# Patient Record
Sex: Male | Born: 1940 | Race: Black or African American | Hispanic: No | Marital: Married | Smoking: Never smoker
Health system: Southern US, Community
[De-identification: ages and names within clinical notes are randomized; demographics above are authoritative.]

## PROBLEM LIST (undated history)

## (undated) DIAGNOSIS — E663 Overweight: Secondary | ICD-10-CM

## (undated) DIAGNOSIS — C61 Malignant neoplasm of prostate: Secondary | ICD-10-CM

## (undated) DIAGNOSIS — C49A0 Gastrointestinal stromal tumor, unspecified site: Secondary | ICD-10-CM

## (undated) DIAGNOSIS — T7840XA Allergy, unspecified, initial encounter: Secondary | ICD-10-CM

## (undated) DIAGNOSIS — M1990 Unspecified osteoarthritis, unspecified site: Secondary | ICD-10-CM

## (undated) DIAGNOSIS — E785 Hyperlipidemia, unspecified: Secondary | ICD-10-CM

## (undated) DIAGNOSIS — N529 Male erectile dysfunction, unspecified: Secondary | ICD-10-CM

## (undated) DIAGNOSIS — N289 Disorder of kidney and ureter, unspecified: Secondary | ICD-10-CM

## (undated) DIAGNOSIS — E049 Nontoxic goiter, unspecified: Secondary | ICD-10-CM

## (undated) DIAGNOSIS — I10 Essential (primary) hypertension: Secondary | ICD-10-CM

## (undated) HISTORY — DX: Unspecified osteoarthritis, unspecified site: M19.90

## (undated) HISTORY — DX: Overweight: E66.3

## (undated) HISTORY — DX: Malignant neoplasm of prostate: C61

## (undated) HISTORY — DX: Hyperlipidemia, unspecified: E78.5

## (undated) HISTORY — DX: Male erectile dysfunction, unspecified: N52.9

## (undated) HISTORY — DX: Essential (primary) hypertension: I10

## (undated) HISTORY — DX: Disorder of kidney and ureter, unspecified: N28.9

## (undated) HISTORY — DX: Allergy, unspecified, initial encounter: T78.40XA

## (undated) HISTORY — PX: RADIOACTIVE SEED IMPLANT: SHX5150

---

## 1965-11-24 HISTORY — PX: TONSILLECTOMY: SHX5217

## 2000-11-02 ENCOUNTER — Encounter: Admission: RE | Admit: 2000-11-02 | Discharge: 2000-11-02 | Payer: Self-pay | Admitting: Internal Medicine

## 2001-03-30 ENCOUNTER — Encounter: Admission: RE | Admit: 2001-03-30 | Discharge: 2001-03-30 | Payer: Self-pay | Admitting: Internal Medicine

## 2001-04-08 ENCOUNTER — Encounter: Admission: RE | Admit: 2001-04-08 | Discharge: 2001-04-08 | Payer: Self-pay | Admitting: Hematology and Oncology

## 2001-08-12 ENCOUNTER — Encounter: Admission: RE | Admit: 2001-08-12 | Discharge: 2001-08-12 | Payer: Self-pay | Admitting: Internal Medicine

## 2001-12-23 ENCOUNTER — Encounter: Admission: RE | Admit: 2001-12-23 | Discharge: 2001-12-23 | Payer: Self-pay

## 2002-06-28 ENCOUNTER — Encounter: Admission: RE | Admit: 2002-06-28 | Discharge: 2002-06-28 | Payer: Self-pay | Admitting: Internal Medicine

## 2002-08-19 ENCOUNTER — Encounter: Admission: RE | Admit: 2002-08-19 | Discharge: 2002-08-19 | Payer: Self-pay | Admitting: Internal Medicine

## 2003-01-23 ENCOUNTER — Encounter: Admission: RE | Admit: 2003-01-23 | Discharge: 2003-01-23 | Payer: Self-pay | Admitting: Internal Medicine

## 2003-01-30 ENCOUNTER — Encounter: Admission: RE | Admit: 2003-01-30 | Discharge: 2003-01-30 | Payer: Self-pay | Admitting: Internal Medicine

## 2003-02-06 ENCOUNTER — Encounter: Admission: RE | Admit: 2003-02-06 | Discharge: 2003-02-06 | Payer: Self-pay | Admitting: Internal Medicine

## 2003-02-14 ENCOUNTER — Encounter: Admission: RE | Admit: 2003-02-14 | Discharge: 2003-02-14 | Payer: Self-pay | Admitting: Internal Medicine

## 2003-02-28 ENCOUNTER — Encounter: Admission: RE | Admit: 2003-02-28 | Discharge: 2003-02-28 | Payer: Self-pay | Admitting: Internal Medicine

## 2003-09-26 ENCOUNTER — Encounter: Admission: RE | Admit: 2003-09-26 | Discharge: 2003-09-26 | Payer: Self-pay | Admitting: Internal Medicine

## 2004-01-24 ENCOUNTER — Encounter: Admission: RE | Admit: 2004-01-24 | Discharge: 2004-01-24 | Payer: Self-pay | Admitting: Internal Medicine

## 2004-02-02 ENCOUNTER — Encounter: Admission: RE | Admit: 2004-02-02 | Discharge: 2004-02-02 | Payer: Self-pay | Admitting: Internal Medicine

## 2004-04-18 ENCOUNTER — Encounter: Admission: RE | Admit: 2004-04-18 | Discharge: 2004-04-18 | Payer: Self-pay | Admitting: Internal Medicine

## 2004-09-16 ENCOUNTER — Ambulatory Visit: Payer: Self-pay | Admitting: Internal Medicine

## 2004-09-20 ENCOUNTER — Ambulatory Visit: Payer: Self-pay | Admitting: Internal Medicine

## 2005-03-20 ENCOUNTER — Ambulatory Visit: Payer: Self-pay | Admitting: Internal Medicine

## 2005-03-31 ENCOUNTER — Ambulatory Visit: Payer: Self-pay | Admitting: Internal Medicine

## 2005-04-03 ENCOUNTER — Ambulatory Visit: Payer: Self-pay | Admitting: Internal Medicine

## 2005-04-17 ENCOUNTER — Ambulatory Visit: Payer: Self-pay | Admitting: Internal Medicine

## 2005-09-25 ENCOUNTER — Ambulatory Visit: Payer: Self-pay | Admitting: Hospitalist

## 2005-10-27 ENCOUNTER — Ambulatory Visit: Payer: Self-pay | Admitting: Internal Medicine

## 2005-11-03 ENCOUNTER — Ambulatory Visit: Payer: Self-pay | Admitting: Internal Medicine

## 2005-11-12 ENCOUNTER — Ambulatory Visit: Payer: Self-pay | Admitting: Internal Medicine

## 2006-05-28 ENCOUNTER — Ambulatory Visit: Payer: Self-pay | Admitting: Hospitalist

## 2006-06-02 ENCOUNTER — Ambulatory Visit: Payer: Self-pay | Admitting: Hospitalist

## 2006-06-25 ENCOUNTER — Ambulatory Visit (HOSPITAL_COMMUNITY): Admission: RE | Admit: 2006-06-25 | Discharge: 2006-06-25 | Payer: Self-pay | Admitting: Gastroenterology

## 2006-10-02 DIAGNOSIS — I10 Essential (primary) hypertension: Secondary | ICD-10-CM | POA: Insufficient documentation

## 2006-10-02 DIAGNOSIS — E785 Hyperlipidemia, unspecified: Secondary | ICD-10-CM

## 2006-10-02 DIAGNOSIS — M1990 Unspecified osteoarthritis, unspecified site: Secondary | ICD-10-CM | POA: Insufficient documentation

## 2006-10-02 DIAGNOSIS — J309 Allergic rhinitis, unspecified: Secondary | ICD-10-CM | POA: Insufficient documentation

## 2006-10-28 ENCOUNTER — Ambulatory Visit: Payer: Self-pay | Admitting: Internal Medicine

## 2006-10-28 LAB — CONVERTED CEMR LAB
CO2: 33 meq/L — ABNORMAL HIGH (ref 19–32)
GFR calc non Af Amer: 59 mL/min
Glomerular Filtration Rate, Af Am: 71 mL/min/{1.73_m2}
Glucose, Bld: 99 mg/dL (ref 70–99)
HCT: 38.9 % — ABNORMAL LOW (ref 39.0–52.0)
Hemoglobin: 13.2 g/dL (ref 13.0–17.0)
MCHC: 34 g/dL (ref 30.0–36.0)
MCV: 95.3 fL (ref 78.0–100.0)
Platelets: 427 10*3/uL — ABNORMAL HIGH (ref 150–400)
RBC: 4.08 M/uL — ABNORMAL LOW (ref 4.22–5.81)
Sodium: 140 meq/L (ref 135–145)

## 2006-12-13 DIAGNOSIS — N259 Disorder resulting from impaired renal tubular function, unspecified: Secondary | ICD-10-CM | POA: Insufficient documentation

## 2007-05-12 ENCOUNTER — Encounter: Admission: RE | Admit: 2007-05-12 | Discharge: 2007-05-12 | Payer: Self-pay | Admitting: Internal Medicine

## 2007-05-12 ENCOUNTER — Ambulatory Visit: Payer: Self-pay | Admitting: Internal Medicine

## 2007-05-12 DIAGNOSIS — R0989 Other specified symptoms and signs involving the circulatory and respiratory systems: Secondary | ICD-10-CM

## 2007-05-18 ENCOUNTER — Encounter (INDEPENDENT_AMBULATORY_CARE_PROVIDER_SITE_OTHER): Payer: Self-pay | Admitting: *Deleted

## 2007-05-24 ENCOUNTER — Ambulatory Visit: Payer: Self-pay | Admitting: Internal Medicine

## 2007-07-05 ENCOUNTER — Telehealth (INDEPENDENT_AMBULATORY_CARE_PROVIDER_SITE_OTHER): Payer: Self-pay | Admitting: *Deleted

## 2007-07-07 ENCOUNTER — Encounter (INDEPENDENT_AMBULATORY_CARE_PROVIDER_SITE_OTHER): Payer: Self-pay | Admitting: *Deleted

## 2007-07-07 ENCOUNTER — Encounter: Payer: Self-pay | Admitting: Internal Medicine

## 2007-08-20 ENCOUNTER — Ambulatory Visit: Payer: Self-pay | Admitting: Internal Medicine

## 2007-09-01 ENCOUNTER — Ambulatory Visit: Payer: Self-pay | Admitting: *Deleted

## 2007-09-10 ENCOUNTER — Ambulatory Visit: Payer: Self-pay | Admitting: *Deleted

## 2007-11-01 ENCOUNTER — Encounter
Admission: RE | Admit: 2007-11-01 | Discharge: 2007-11-02 | Payer: Self-pay | Admitting: Physical Medicine & Rehabilitation

## 2007-11-01 ENCOUNTER — Ambulatory Visit: Payer: Self-pay | Admitting: Physical Medicine & Rehabilitation

## 2007-11-02 ENCOUNTER — Encounter (INDEPENDENT_AMBULATORY_CARE_PROVIDER_SITE_OTHER): Payer: Self-pay | Admitting: *Deleted

## 2007-11-25 HISTORY — PX: HERNIA REPAIR: SHX51

## 2007-12-02 ENCOUNTER — Ambulatory Visit: Payer: Self-pay | Admitting: Internal Medicine

## 2007-12-02 DIAGNOSIS — F528 Other sexual dysfunction not due to a substance or known physiological condition: Secondary | ICD-10-CM | POA: Insufficient documentation

## 2008-02-21 ENCOUNTER — Ambulatory Visit: Payer: Self-pay | Admitting: Internal Medicine

## 2008-02-21 LAB — CONVERTED CEMR LAB
Nitrite: NEGATIVE
Urobilinogen, UA: NEGATIVE
WBC Urine, dipstick: NEGATIVE
pH: 6.5

## 2008-02-28 ENCOUNTER — Encounter (INDEPENDENT_AMBULATORY_CARE_PROVIDER_SITE_OTHER): Payer: Self-pay | Admitting: *Deleted

## 2008-05-16 ENCOUNTER — Encounter: Admission: RE | Admit: 2008-05-16 | Discharge: 2008-05-16 | Payer: Self-pay | Admitting: General Surgery

## 2008-05-17 ENCOUNTER — Ambulatory Visit (HOSPITAL_BASED_OUTPATIENT_CLINIC_OR_DEPARTMENT_OTHER): Admission: RE | Admit: 2008-05-17 | Discharge: 2008-05-17 | Payer: Self-pay | Admitting: General Surgery

## 2008-11-20 ENCOUNTER — Telehealth (INDEPENDENT_AMBULATORY_CARE_PROVIDER_SITE_OTHER): Payer: Self-pay | Admitting: *Deleted

## 2008-11-20 ENCOUNTER — Emergency Department (HOSPITAL_BASED_OUTPATIENT_CLINIC_OR_DEPARTMENT_OTHER): Admission: EM | Admit: 2008-11-20 | Discharge: 2008-11-20 | Payer: Self-pay | Admitting: Emergency Medicine

## 2009-03-20 ENCOUNTER — Ambulatory Visit: Payer: Self-pay | Admitting: Internal Medicine

## 2009-03-23 ENCOUNTER — Telehealth (INDEPENDENT_AMBULATORY_CARE_PROVIDER_SITE_OTHER): Payer: Self-pay | Admitting: *Deleted

## 2009-03-23 LAB — CONVERTED CEMR LAB
Basophils Relative: 1.8 % (ref 0.0–3.0)
CO2: 35 meq/L — ABNORMAL HIGH (ref 19–32)
Calcium: 9.1 mg/dL (ref 8.4–10.5)
Creatinine, Ser: 1.2 mg/dL (ref 0.4–1.5)
Eosinophils Absolute: 0.2 10*3/uL (ref 0.0–0.7)
Eosinophils Relative: 3.8 % (ref 0.0–5.0)
GFR calc non Af Amer: 77.29 mL/min (ref >60)
Glucose, Bld: 94 mg/dL (ref 70–99)
HCT: 38.5 % — ABNORMAL LOW (ref 39.0–52.0)
HDL: 49.4 mg/dL (ref >39.00)
Lymphs Abs: 1.5 10*3/uL (ref 0.7–4.0)
MCHC: 34.7 g/dL (ref 30.0–36.0)
MCV: 95 fL (ref 78.0–100.0)
Monocytes Absolute: 0.4 10*3/uL (ref 0.1–1.0)
Neutrophils Relative %: 48.2 % (ref 43.0–77.0)
RBC: 4.05 M/uL — ABNORMAL LOW (ref 4.22–5.81)
Sodium: 143 meq/L (ref 135–145)
Triglycerides: 78 mg/dL (ref 0.0–149.0)
WBC: 4 10*3/uL — ABNORMAL LOW (ref 4.5–10.5)

## 2009-05-09 ENCOUNTER — Ambulatory Visit: Payer: Self-pay | Admitting: Internal Medicine

## 2009-05-31 ENCOUNTER — Ambulatory Visit: Payer: Self-pay | Admitting: Internal Medicine

## 2009-07-02 ENCOUNTER — Ambulatory Visit: Payer: Self-pay | Admitting: Internal Medicine

## 2009-07-03 LAB — CONVERTED CEMR LAB
BUN: 13 mg/dL (ref 6–23)
CO2: 34 meq/L — ABNORMAL HIGH (ref 19–32)
Chloride: 104 meq/L (ref 96–112)
Creatinine, Ser: 1.5 mg/dL (ref 0.4–1.5)
Glucose, Bld: 93 mg/dL (ref 70–99)
Potassium: 4 meq/L (ref 3.5–5.1)

## 2009-07-27 ENCOUNTER — Encounter (INDEPENDENT_AMBULATORY_CARE_PROVIDER_SITE_OTHER): Payer: Self-pay | Admitting: *Deleted

## 2009-07-31 ENCOUNTER — Telehealth (INDEPENDENT_AMBULATORY_CARE_PROVIDER_SITE_OTHER): Payer: Self-pay | Admitting: *Deleted

## 2009-10-16 ENCOUNTER — Ambulatory Visit: Payer: Self-pay | Admitting: Internal Medicine

## 2009-10-17 ENCOUNTER — Telehealth (INDEPENDENT_AMBULATORY_CARE_PROVIDER_SITE_OTHER): Payer: Self-pay | Admitting: *Deleted

## 2009-10-29 ENCOUNTER — Telehealth: Payer: Self-pay | Admitting: Internal Medicine

## 2009-11-07 ENCOUNTER — Telehealth: Payer: Self-pay | Admitting: Internal Medicine

## 2009-11-08 ENCOUNTER — Encounter: Payer: Self-pay | Admitting: Internal Medicine

## 2009-12-25 DIAGNOSIS — C61 Malignant neoplasm of prostate: Secondary | ICD-10-CM

## 2009-12-25 HISTORY — DX: Malignant neoplasm of prostate: C61

## 2010-01-01 ENCOUNTER — Encounter: Payer: Self-pay | Admitting: Internal Medicine

## 2010-01-17 ENCOUNTER — Encounter: Payer: Self-pay | Admitting: Internal Medicine

## 2010-01-18 ENCOUNTER — Ambulatory Visit: Admission: RE | Admit: 2010-01-18 | Discharge: 2010-02-07 | Payer: Self-pay | Admitting: Radiation Oncology

## 2010-01-29 ENCOUNTER — Encounter: Payer: Self-pay | Admitting: Internal Medicine

## 2010-04-18 ENCOUNTER — Ambulatory Visit: Admission: RE | Admit: 2010-04-18 | Discharge: 2010-07-17 | Payer: Self-pay | Admitting: Radiation Oncology

## 2010-06-24 ENCOUNTER — Telehealth (INDEPENDENT_AMBULATORY_CARE_PROVIDER_SITE_OTHER): Payer: Self-pay | Admitting: *Deleted

## 2010-06-27 ENCOUNTER — Ambulatory Visit: Payer: Self-pay | Admitting: Internal Medicine

## 2010-06-27 DIAGNOSIS — Z8546 Personal history of malignant neoplasm of prostate: Secondary | ICD-10-CM | POA: Insufficient documentation

## 2010-07-04 LAB — CONVERTED CEMR LAB
Basophils Absolute: 0.1 10*3/uL (ref 0.0–0.1)
Basophils Relative: 1.1 % (ref 0.0–3.0)
CO2: 30 meq/L (ref 19–32)
Calcium: 9.6 mg/dL (ref 8.4–10.5)
Creatinine, Ser: 1.2 mg/dL (ref 0.4–1.5)
Eosinophils Absolute: 0.3 10*3/uL (ref 0.0–0.7)
GFR calc non Af Amer: 74.14 mL/min (ref >60)
Lymphocytes Relative: 36.5 % (ref 12.0–46.0)
MCHC: 34.4 g/dL (ref 30.0–36.0)
Monocytes Relative: 10.8 % (ref 3.0–12.0)
Neutrophils Relative %: 45.7 % (ref 43.0–77.0)
RBC: 3.88 M/uL — ABNORMAL LOW (ref 4.22–5.81)
RDW: 13.5 % (ref 11.5–14.6)

## 2010-10-03 ENCOUNTER — Emergency Department (HOSPITAL_BASED_OUTPATIENT_CLINIC_OR_DEPARTMENT_OTHER): Admission: EM | Admit: 2010-10-03 | Discharge: 2010-10-03 | Payer: Self-pay | Admitting: Emergency Medicine

## 2010-10-04 ENCOUNTER — Ambulatory Visit: Payer: Self-pay | Admitting: Internal Medicine

## 2010-11-15 ENCOUNTER — Ambulatory Visit: Payer: Self-pay | Admitting: Internal Medicine

## 2010-11-15 DIAGNOSIS — M25519 Pain in unspecified shoulder: Secondary | ICD-10-CM | POA: Insufficient documentation

## 2010-11-29 ENCOUNTER — Encounter: Payer: Self-pay | Admitting: Internal Medicine

## 2010-12-04 ENCOUNTER — Telehealth (INDEPENDENT_AMBULATORY_CARE_PROVIDER_SITE_OTHER): Payer: Self-pay | Admitting: *Deleted

## 2010-12-11 ENCOUNTER — Telehealth: Payer: Self-pay | Admitting: Internal Medicine

## 2010-12-20 ENCOUNTER — Encounter: Payer: Self-pay | Admitting: Internal Medicine

## 2010-12-22 LAB — CONVERTED CEMR LAB
ALT: 36 units/L (ref 0–53)
BUN: 14 mg/dL (ref 6–23)
Calcium: 9.2 mg/dL (ref 8.4–10.5)
Chloride: 103 meq/L (ref 96–112)
Cholesterol: 165 mg/dL (ref 0–200)
Creatinine, Ser: 1.5 mg/dL (ref 0.4–1.5)
HDL: 49.1 mg/dL (ref >39.00)
LDL Cholesterol: 100 mg/dL — ABNORMAL HIGH (ref 0–99)
PSA: 10.06 ng/mL — ABNORMAL HIGH (ref 0.10–4.00)
Total CHOL/HDL Ratio: 3
Triglycerides: 79 mg/dL (ref 0.0–149.0)
VLDL: 15.8 mg/dL (ref 0.0–40.0)

## 2010-12-24 NOTE — Letter (Signed)
Summary: [facility]  [facility]   Imported By: Lanelle Bal 02/11/2010 08:14:39  _____________________________________________________________________  External Attachment:    Type:   Image     Comment:   External Document

## 2010-12-24 NOTE — Progress Notes (Signed)
Summary: appt  Phone Note Outgoing Call   Summary of Call: Pt needs a fasting follow up visit. Army Fossa CMA  June 24, 2010 9:39 AM   Follow-up for Phone Call        lmtcb.Karoline Caldwell Negrete  June 24, 2010 10:28 AM  Patient called back and will call when he gets home to his calender.Marland KitchenMarland KitchenHarold Barban  June 24, 2010 10:43 AM  Additional Follow-up for Phone Call Additional follow up Details #1::        pt has an appt for a fasting followup 8/4 @ 10:40 Additional Follow-up by: Lavell Islam,  June 24, 2010 2:30 PM

## 2010-12-24 NOTE — Assessment & Plan Note (Signed)
Summary: followup/fasting//kn   Vital Signs:  Patient profile:   70 year old male Weight:      198 pounds Pulse rate:   70 / minute Pulse rhythm:   regular BP sitting:   136 / 78  (left arm) Cuff size:   regular  Vitals Entered By: Army Fossa CMA (June 27, 2010 10:51 AM) CC: Pt here to f/u.- Fasting Comments Discuss Hot flashes.   History of Present Illness: since the last OV  he was dx w/ prostate ca 2-11, curren Rx is "pills and shots" c/o hot flashes      Current Medications (verified): 1)  Norvasc 10 Mg Tabs (Amlodipine Besylate) .... Take 1 Tablet By Mouth Once A Day 2)  Fexofenadine Hcl 180 Mg Tabs (Fexofenadine Hcl) .Marland Kitchen.. 1 By Mouth Once Daily 3)  Nasonex 50 Mcg/act  Susp (Mometasone Furoate) 4)  Herbs 5)  Benadryl 25 Mg Caps (Diphenhydramine Hcl) .... Take 1 Capsule By Mouth At Bedtime As Needed 6)  Simvastatin 20 Mg Tabs (Simvastatin) .Marland Kitchen.. 1 By Mouth At Bedtime 7)  Maxzide-25 37.5-25 Mg Tabs (Triamterene-Hctz) .Marland Kitchen.. 1 By Mouth Once Daily  Allergies: 1)  ! * Grass/weeds  Past History:  Past Medical History: Allergic rhinitis Hyperlipidemia Hypertension  Osteoarthritis  Erectile dysfunction-  Overweight  Renal insufficiency, Baseline Cr 1.5 11/06; Cr 1.3 5/02 Prostate cancer,dx 12/2009  Past Surgical History: Reviewed history from 03/20/2009 and no changes required. Tonsillectomy- 1967 R  hernia repair 2009  Social History: Lives in [place] with his wife.  He has 3 daughters and one son who range in ages from 78 to 53.  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  Denies tobacco or EtOH use. Has college education.   Review of Systems General:  remains active  Hyperlipidemia-- good medication compliance  Hypertension -- no ambulatory BPs   . Psych:  Denies anxiety and depression; sleeps well .  Physical Exam  General:  alert and  well-developed.   Lungs:  normal respiratory effort, no intercostal retractions, no accessory muscle use, and normal breath sounds.   Heart:  normal rate, regular rhythm, and no murmur.   Extremities:  no pretibial edema bilaterally    Impression & Recommendations:  Problem # 1:  RENAL INSUFFICIENCY (ICD-588.9) labs   Problem # 2:  HYPERTENSION (ICD-401.9)  well controlled  His updated medication list for this problem includes:    Norvasc 10 Mg Tabs (Amlodipine besylate) .Marland Kitchen... Take 1 tablet by mouth once a day    Maxzide-25 37.5-25 Mg Tabs (Triamterene-hctz) .Marland Kitchen... 1 by mouth once daily  BP today: 136/78 Prior BP: 130/90 (10/16/2009)  Labs Reviewed: K+: 3.4 (10/16/2009) Creat: : 1.5 (10/16/2009)   Chol: 165 (10/16/2009)   HDL: 49.10 (10/16/2009)   LDL: 100 (10/16/2009)   TG: 79.0 (10/16/2009)  Orders: Venipuncture ([address]109) TLB-BMP (Basic Metabolic Panel-BMET) (80048-METABOL) TLB-CBC Platelet - w/Differential (85025-CBCD) Specimen Handling (60454)  Problem # 3:  HYPERLIPIDEMIA (ICD-272.4)  His updated medication list for this problem includes:    Simvastatin 20 Mg Tabs (Simvastatin) .Marland Kitchen... 1 by mouth at bedtime  Labs Reviewed: SGOT: 39 (10/16/2009)   SGPT: 36 (10/16/2009)   HDL:49.10 (10/16/2009), 49.40 (03/20/2009)  LDL:100 (10/16/2009)  Chol:165 (10/16/2009), 284 (03/20/2009)  Trig:79.0 (10/16/2009), 78.0 (03/20/2009)  Problem # 4:  PROSTATE CANCER, HX OF (ICD-V10.46) under the care of urology  Complete  Medication List: 1)  Norvasc 10 Mg Tabs (Amlodipine besylate) .... Take 1 tablet by mouth once a day 2)  Fexofenadine Hcl 180 Mg Tabs (Fexofenadine hcl) .Marland Kitchen.. 1 by mouth once daily 3)  Nasonex 50 Mcg/act Susp (Mometasone furoate) 4)  Herbs  5)  Benadryl 25 Mg Caps (Diphenhydramine hcl) .... Take 1 capsule by mouth at bedtime as needed 6)  Simvastatin 20 Mg Tabs (Simvastatin) .Marland Kitchen.. 1 by mouth at bedtime 7)  Maxzide-25 37.5-25 Mg Tabs (Triamterene-hctz) .Marland Kitchen.. 1 by mouth once  daily  Patient Instructions: 1)  Please schedule a follow-up appointment in 4 months , fasting , physical exam
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
## 2010-12-24 NOTE — Letter (Signed)
Summary: Dx prostate ca, Seeds??  [facility]   Imported By: Lanelle Bal 01/28/2010 10:37:44  _____________________________________________________________________  External Attachment:    Type:   Image     Comment:   External Document

## 2010-12-26 NOTE — Progress Notes (Signed)
Summary: [facility] issue  Phone Note Call from Patient   Caller: Spouse(Shirley) Summary of Call: Wife states that Dr. Drue Novel or his nurse signed pt up for [facility]. Pt has not received card and was told he wasn't signed up correctly. Wife is requesting to speak with nurse. She can be contacted at [phone]. Initial call taken by: Lavell Islam,  December 11, 2010 1:01 PM  Follow-up for Phone Call        Spouse notified that we do not do this. Patient must complete the paperwork. She is aware that she must call [facility]. Follow-up by: Lucious Groves CMA,  December 11, 2010 3:06 PM

## 2010-12-26 NOTE — Progress Notes (Signed)
Summary: refills  Phone Note Refill Request   Refills Requested: Medication #1:  MAXZIDE-25 37.5-25 MG TABS 1 by mouth once daily.  Medication #2:  NORVASC 10 MG TABS Take 1 tablet by mouth once a day  Medication #3:  SIMVASTATIN 20 MG TABS 1 by mouth at bedtime  Medication #4:  FEXOFENADINE HCL 180 MG TABS 1 by mouth once daily send to [facility] today.. Pt currently out need 30 day supply of NORVASC 10 MG  [address]...Marland KitchenMarland KitchenFelecia Deloach CMA  December 04, 2010 8:43 AM      Prescriptions: SIMVASTATIN 20 MG TABS (SIMVASTATIN) 1 by mouth at bedtime  #90 x 1   Entered by:   Army Fossa CMA   Authorized by:   Nolon Rod. Paz MD   Signed by:   Army Fossa CMA on 12/04/2010   Method used:   Faxed to ...       [facility] (mail-order)             , [place]         Ph: [phone]       Fax: [phone]   RxID:   [id number] MAXZIDE-25 37.5-25 MG TABS (TRIAMTERENE-HCTZ) 1 by mouth once daily  #90 x 1   Entered by:   Army Fossa CMA   Authorized by:   Nolon Rod. Paz MD   Signed by:   Army Fossa CMA on 12/04/2010   Method used:   Faxed to ...       [facility] (mail-order)             , [place]         Ph: [phone]       Fax: [phone]   RxID:   [id number] FEXOFENADINE HCL 180 MG TABS (FEXOFENADINE HCL) 1 by mouth once daily  #90 x 1   Entered by:   Army Fossa CMA   Authorized by:   Nolon Rod. Paz MD   Signed by:   Army Fossa CMA on 12/04/2010   Method used:   Faxed to ...       [facility] (mail-order)             , [place]         Ph: [phone]       Fax: [phone]   RxID:   [id number] NORVASC 10 MG TABS (AMLODIPINE BESYLATE) Take 1 tablet by mouth once a day  #90 x 1   Entered by:   Army Fossa CMA   Authorized by:   Nolon Rod. Paz MD   Signed by:   Army Fossa CMA on 12/04/2010   Method used:   Faxed to ...       [facility] (mail-order)             , [place]         Ph: [phone]       Fax: [phone]   RxID:   [id number] NORVASC 10 MG TABS  (AMLODIPINE BESYLATE) Take 1 tablet by mouth once a day  #30 Tablet x 0   Entered by:   Army Fossa CMA   Authorized by:   Nolon Rod. Paz MD   Signed by:   Army Fossa CMA on 12/04/2010   Method used:   Electronically to        [facility] [phone]* (retail)       [address]       [place], [place]  [address]  Ph: [phone]       Fax: [phone]   RxID:   [id number]
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
## 2010-12-26 NOTE — Consult Note (Signed)
Summary: shoulder --injection,  conservative Rx-- Orthopaedic    [facility]   Imported By: Lanelle Bal 12/13/2010 08:51:51  _____________________________________________________________________  External Attachment:    Type:   Image     Comment:   External Document

## 2010-12-30 ENCOUNTER — Encounter: Payer: Self-pay | Admitting: Internal Medicine

## 2011-01-15 NOTE — Letter (Signed)
Summary: [facility]  [facility]   Imported By: Maryln Gottron 01/07/2011 09:40:01  _____________________________________________________________________  External Attachment:    Type:   Image     Comment:   External Document

## 2011-01-15 NOTE — Letter (Signed)
Summary: Self Health Assessment/[employer]  Self Health Assessment/[employer]   Imported By: Maryln Gottron 01/08/2011 13:32:08  _____________________________________________________________________  External Attachment:    Type:   Image     Comment:   External Document

## 2011-04-08 NOTE — Op Note (Signed)
NAMEISSAI, WERLING              ACCOUNT NO.:  [id number]   MEDICAL RECORD NO.:  [id number]          PATIENT TYPE:  AMB   LOCATION:  [facility]                         FACILITY:  [facility]   PHYSICIAN:  Anselm Pancoast. Weatherly, M.D.DATE OF BIRTH:  1940-12-17   DATE OF PROCEDURE:  05/17/2008  DATE OF DISCHARGE:                               OPERATIVE REPORT   PREOPERATIVE DIAGNOSIS:  Right inguinal hernia, probably direct.   PROCEDURE:  Right inguinal herniorrhaphy, with local anesthesia with  sedation.   SURGEON:  Anselm Pancoast. Zachery Dakins, M.D.   HISTORY:  [name] Wood is a 70 year old male who was referred to me by  Dr. Drue Novel for repair of a symptomatic right inguinal hernia.  The patient  is on mild hypertensive medications.  He has had no previous surgery and  is a retired Optician, dispensing.  The patient does not do strenuous activity but  understands that with the hernia repair he will need to be on kind of  light activity for approximately 2-3 weeks and says that that is not  going to be a problem.  The patient on physical exam as a lemon-sized  bulge very close to the base of the symphysis pubis or inguinal area,  and I am sure that this is a direct hernia.  The patient is having no  problems with voiding or change in bowel functions.   The patient's area was marked prior to going back to the O.R. suite.  Then, I clipped the inguinal groin area, prepped him with Betadine  solution.  An LOA tube was placed by Dr. Shireen Quan and associates, and  then the patient's groin was prepped with Betadine solution and draped  in a sterile manner.  First, an inguinal incision area was infiltrated  with a mixture of 0.5% Xylocaine and 0.25% Marcaine with adrenalin, and  the ilioinguinal nerve area was anesthetized with approximately 10 mL  with a blunted 22-gauge needle.  The incision was made.  Sharp  dissection down through the skin and subcutaneous tissue, a little  poofy.  Two superficial veins were clamped,  divided, and ligated with  fine Vicryl, and then the external oblique aponeurosis opened through  the external ring.  The cord structures were elevated, and it was a  direct inguinal hernia coming up right beside the cord structures, and  this was separated, and then there basically a hernia sac that I  separated from the base of the bladder area, and then a high sac  ligation was done with the sac opened with 2-0 Vicryl and a second  suture just distal.  I then pushed back the little fatty tissue around  the medial bladder, and then closed the floor with a running 2-0  Prolene, reconstructed the internal ring area, and then went back and  tied the two ends together.  A piece of Prolene mesh shaped like a sail  slit laterally was used to reinforce the floor.  It was sutured to the  inguinal ligament inferior with a running 2-0 Prolene.  The two details  was suture around the internal ring so it is lying flat.  The  ilioinguinal nerve was with the cord structures, and the superior flap  was sutured down with interrupted sutures of 2-0 Prolene and was lying  flat without excessive tension.  The cord structures were back in their  normal anatomical position.  It was taken care that the little  reinforced internal ring area should not be too tight, and then the  external oblique was closed with a 2-0 Vicryl.  I did put additional  Marcaine with adrenalin in the base of the floor where I had repaired  the direct defect.  The  external oblique was closed with 2-0 Vicryl, Scarpa's fascia with  interrupted 3-0 Vicryl and 4-0 Dexon subcuticular, and Benzoin and Steri-  Strips on the skin.  The testicle was in its normal position, and he  should be released after a short stay in recovery room.           ______________________________  Anselm Pancoast. Zachery Dakins, M.D.     WJW/MEDQ  D:  05/17/2008  T:  05/17/2008  Job:  [id number]   cc:   [name], MD  [phone] [place], [place] [address]
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
## 2011-04-09 ENCOUNTER — Other Ambulatory Visit: Payer: Self-pay

## 2011-04-09 MED ORDER — MOMETASONE FUROATE 50 MCG/ACT NA SUSP: 2.0000 | Freq: Every day | NASAL | Status: DC

## 2011-04-11 NOTE — Assessment & Plan Note (Signed)
[facility]                        [place] OFFICE NOTE   NAME:POWELLKorvin, Wood                     MRN:          [id number]  DATE:10/28/2006                            DOB:          03-17-1941    CHIEF COMPLAINT:  Here to establish.   HISTORY AND PHYSICAL:  Wood Wood is a 70 year old African-American male  who presented to the office to get established. He previously saw Dr.  Okey Dupre at the [facility] clinic. In general, he is doing well. His  only question today is about Viagra which he has been taking for a  number of months. He states that after he takes it, he gets groggy but  denies any dizziness. Previously he took Levitra and did not like it. He  wonders if there are other options or if he could cut the Viagra in  half.   PAST MEDICAL HISTORY:  1. Hypertension for a number of years.  2. Reports colonoscopy at [facility] 3 months ago which was, according      to the patient, okay.   FAMILY HISTORY:  1. Father is deceased, he had diabetes, kidney failure and      hypertension.  2. Mother is deceased from old age.  3. No history of prostate or colon cancer in the family. No history of      heart attacks or strokes.   SOCIAL HISTORY:  He smoked when he was a teen but he has not done so in  a long time. He does not drink. He is married and has 4 children.   REVIEW OF SYSTEMS:  Again, he is doing well. He denies any chest pain,  shortness of breath, nausea, diarrhea, dysuria, blood in the urine or  difficulty urinating.   MEDICATIONS:  1. HCTZ 25 one a day.  2. Amlodipine 10 one p.o. daily.  3. Enalapril 20 one p.o. b.i.d.  4. Allegra 180 one p.o. daily.  5. Viagra 100 one p.o. daily p.r.n.   ALLERGIES:  No known drug allergies.   PHYSICAL EXAMINATION:  GENERAL:  The patient is alert, oriented in no  apparent distress.  VITAL SIGNS:  His blood pressure is 140/80, pulse 60, respirations 14,  afebrile. Weight 186. He is  5 feet 10-3/4 inches tall.  LUNGS:  Clear  bilaterally.  CARDIOVASCULAR:  Regular rate and rhythm without a murmur.  ABDOMEN:  Not distended, soft, good bowel sounds. No organomegaly.  EXTREMITIES:  No edema.   ASSESSMENT/PLAN:  1. Hypertension. This seems to be well controlled at the present. I RF      his medications and will do BMP and a CBC today.  2. Erectile dysfunction. Talked to the patient about other options      such as Cialis and Levitra but at this time he is satisfied with      the results of Viagra. He says it is the after effects that he does      not like. We agreed that he is going to take 1/2 of the 100 mg pill      and see how that works.  3. He had a tetanus shot this year. I explained to him the benefits of      the Pneumovax and the influenza shot; however, he declined.  4. He is to get the [facility] records in reference to his      colonoscopy.  5. Office visit in 6 months fasting.     [name], MD  Electronically Signed    JP/MedQ  DD: 10/28/2006  DT: 10/28/2006  Job #: [id number]

## 2011-04-17 ENCOUNTER — Telehealth: Payer: Self-pay

## 2011-04-17 NOTE — Telephone Encounter (Signed)
Wife's cell [phone], Dr.Paz please advise if ok for patient to have referral to Dr.Nesi for ED or if patient needs to see you first

## 2011-04-17 NOTE — Telephone Encounter (Signed)
H/o prostate cancer, needs to be assessed by his urologist please

## 2011-04-18 NOTE — Telephone Encounter (Signed)
Pt is already an established pt w/ Dr.Nesi, pts wife is aware they can call and make an appt.

## 2011-05-11 ENCOUNTER — Other Ambulatory Visit: Payer: Self-pay | Admitting: Internal Medicine

## 2011-06-07 ENCOUNTER — Other Ambulatory Visit: Payer: Self-pay | Admitting: Internal Medicine

## 2011-06-09 ENCOUNTER — Other Ambulatory Visit: Payer: Self-pay | Admitting: Internal Medicine

## 2011-06-11 MED ORDER — FEXOFENADINE HCL 180 MG PO TABS: 180.0000 mg | ORAL_TABLET | Freq: Every day | ORAL | Status: DC

## 2011-06-11 NOTE — Telephone Encounter (Signed)
Ok FEXOFENADINE  180 MG TABS 1 po qd  #30, 4 RF

## 2011-06-12 ENCOUNTER — Ambulatory Visit
Admission: RE | Admit: 2011-06-12 | Discharge: 2011-06-12 | Disposition: A | Discharge status: Home / Self Care | Payer: Medicare Other | Source: Ambulatory Visit | Attending: Radiation Oncology | Admitting: Radiation Oncology

## 2011-06-12 DIAGNOSIS — C61 Malignant neoplasm of prostate: Secondary | ICD-10-CM | POA: Insufficient documentation

## 2011-06-12 DIAGNOSIS — R350 Frequency of micturition: Secondary | ICD-10-CM | POA: Insufficient documentation

## 2011-06-24 ENCOUNTER — Other Ambulatory Visit: Payer: Self-pay | Admitting: Urology

## 2011-06-24 ENCOUNTER — Ambulatory Visit (HOSPITAL_BASED_OUTPATIENT_CLINIC_OR_DEPARTMENT_OTHER)
Admission: RE | Admit: 2011-06-24 | Discharge: 2011-06-24 | Disposition: A | Discharge status: Home / Self Care | Payer: MEDICARE | Source: Ambulatory Visit | Attending: Urology | Admitting: Urology

## 2011-06-24 ENCOUNTER — Ambulatory Visit
Admission: RE | Admit: 2011-06-24 | Discharge: 2011-06-24 | Disposition: A | Discharge status: Home / Self Care | Payer: MEDICARE | Source: Ambulatory Visit | Attending: Urology | Admitting: Urology

## 2011-06-24 DIAGNOSIS — C61 Malignant neoplasm of prostate: Secondary | ICD-10-CM | POA: Insufficient documentation

## 2011-06-24 DIAGNOSIS — Z01811 Encounter for preprocedural respiratory examination: Secondary | ICD-10-CM

## 2011-06-26 ENCOUNTER — Other Ambulatory Visit: Payer: Self-pay | Admitting: Internal Medicine

## 2011-07-18 LAB — COMPREHENSIVE METABOLIC PANEL
ALT: 24 U/L (ref 0–53)
AST: 29 U/L (ref 0–37)
Albumin: 4 g/dL (ref 3.5–5.2)
Alkaline Phosphatase: 59 U/L (ref 39–117)
Potassium: 3.9 mEq/L (ref 3.5–5.1)
Sodium: 139 mEq/L (ref 135–145)
Total Protein: 7.5 g/dL (ref 6.0–8.3)

## 2011-07-18 LAB — CBC
HCT: 38.3 % — ABNORMAL LOW (ref 39.0–52.0)
Hemoglobin: 13.2 g/dL (ref 13.0–17.0)
RBC: 4.27 MIL/uL (ref 4.22–5.81)

## 2011-07-18 LAB — PROTIME-INR
INR: 1.06 (ref 0.00–1.49)
Prothrombin Time: 14 seconds (ref 11.6–15.2)

## 2011-07-25 ENCOUNTER — Ambulatory Visit (HOSPITAL_COMMUNITY): Payer: MEDICARE | Attending: Urology

## 2011-07-25 ENCOUNTER — Ambulatory Visit (HOSPITAL_BASED_OUTPATIENT_CLINIC_OR_DEPARTMENT_OTHER)
Admission: RE | Admit: 2011-07-25 | Discharge: 2011-07-25 | Disposition: A | Discharge status: Home / Self Care | Payer: Medicare Other | Source: Ambulatory Visit | Attending: Urology | Admitting: Urology

## 2011-07-25 DIAGNOSIS — R9431 Abnormal electrocardiogram [ECG] [EKG]: Secondary | ICD-10-CM | POA: Insufficient documentation

## 2011-07-25 DIAGNOSIS — Z01810 Encounter for preprocedural cardiovascular examination: Secondary | ICD-10-CM | POA: Insufficient documentation

## 2011-07-25 DIAGNOSIS — Z01812 Encounter for preprocedural laboratory examination: Secondary | ICD-10-CM | POA: Insufficient documentation

## 2011-07-25 DIAGNOSIS — C61 Malignant neoplasm of prostate: Secondary | ICD-10-CM | POA: Insufficient documentation

## 2011-07-26 ENCOUNTER — Emergency Department (HOSPITAL_COMMUNITY)
Admission: EM | Admit: 2011-07-26 | Discharge: 2011-07-26 | Disposition: A | Discharge status: Home / Self Care | Payer: MEDICARE | Attending: Emergency Medicine | Admitting: Emergency Medicine

## 2011-07-26 DIAGNOSIS — Z466 Encounter for fitting and adjustment of urinary device: Secondary | ICD-10-CM | POA: Insufficient documentation

## 2011-07-26 DIAGNOSIS — N400 Enlarged prostate without lower urinary tract symptoms: Secondary | ICD-10-CM | POA: Insufficient documentation

## 2011-07-26 DIAGNOSIS — I10 Essential (primary) hypertension: Secondary | ICD-10-CM | POA: Insufficient documentation

## 2011-07-26 DIAGNOSIS — E785 Hyperlipidemia, unspecified: Secondary | ICD-10-CM | POA: Insufficient documentation

## 2011-08-01 NOTE — Op Note (Signed)
  [name] Wood, CHALFIN              ACCOUNT NO.:  [id number]  MEDICAL RECORD NO.:  [id number]  LOCATION:                                 FACILITY:  PHYSICIAN:  Danae Chen, M.D.  DATE OF BIRTH:  1941/06/04  DATE OF PROCEDURE:  07/25/2011 DATE OF DISCHARGE:                              OPERATIVE REPORT   PREOPERATIVE DIAGNOSIS:  Adenocarcinoma of prostate, stage T1c.  POSTOPERATIVE DIAGNOSIS:  Adenocarcinoma of prostate, stage T1c.  PROCEDURE DONE:  Iodine-125 seed implantation and cystoscopy.  SURGEON:  Danae Chen, M.D. and Oneita Hurt, M.D.  ANESTHESIA:  General.  INDICATIONS:  The patient is a 70 year old male who was diagnosed in February 2011 with adenocarcinoma of the prostate.  He had Gleason 6. His PSA was 10.06.  He was advised then to have brachytherapy.  He received Lupron to downsize his prostate gland.  His PSA went down to 3.1 in July 2011, and he felt that he was cured of his cancer and did not need any treatment.  I saw him in June 2012, repeated his PSA and was 5.95.  I discussed with him regarding need for having for treatment. He had previously seen Dr. Kathrynn Running, and agreed to proceed with  brachytherapy at this time.  He is scheduled today for the procedure.  The patient was identified by his wristband and proper time-out was taken.  DESCRIPTION OF PROCEDURE:  Under general anesthesia, he was prepped and draped and placed in the dorsal lithotomy position.  A #16 Foley catheter was inserted in the bladder.  Then, ultrasound planning was done by Dr. Kathrynn Running.  When the planning was completed, under ultrasound guidance and with the Nucletron a total of 98 seeds, through 25 needles were implanted in the prostate.  Under fluoroscopy, there appeared to be good seeds in distribution.  The Foley catheter was removed.  A flexible cystoscope was passed under direct vision in the bladder.  The anterior urethra is normal.  He has trilobar  prostatic hypertrophy.  The bladder mucosa is normal.  There is no stone or tumor in the bladder.  The ureteral orifices are in normal position and shape with clear efflux.  The cystoscope was then removed.  Then, a Foley catheter was passed in the bladder.  The patient tolerated the procedure well and left the OR in satisfactory condition to postanesthesia care unit.    Danae Chen, M.D.    MN/MEDQ  D:  07/25/2011  T:  07/25/2011  Job:  [id number]  Electronically Signed by Lindaann Slough M.D. on 08/01/2011 06:27:54 PM

## 2011-08-09 ENCOUNTER — Other Ambulatory Visit: Payer: Self-pay | Admitting: Internal Medicine

## 2011-08-15 ENCOUNTER — Other Ambulatory Visit: Payer: Self-pay | Admitting: *Deleted

## 2011-08-15 ENCOUNTER — Ambulatory Visit
Admission: RE | Admit: 2011-08-15 | Discharge: 2011-08-15 | Disposition: A | Discharge status: Home / Self Care | Payer: Medicare Other | Source: Ambulatory Visit | Attending: Radiation Oncology | Admitting: Radiation Oncology

## 2011-08-15 ENCOUNTER — Other Ambulatory Visit: Payer: Self-pay | Admitting: Internal Medicine

## 2011-08-15 DIAGNOSIS — C61 Malignant neoplasm of prostate: Secondary | ICD-10-CM | POA: Insufficient documentation

## 2011-08-15 DIAGNOSIS — Z8546 Personal history of malignant neoplasm of prostate: Secondary | ICD-10-CM

## 2011-08-15 MED ORDER — SIMVASTATIN 20 MG PO TABS: 20.0000 mg | ORAL_TABLET | Freq: Every day | ORAL | Status: DC

## 2011-08-15 NOTE — Telephone Encounter (Signed)
Patient wants refill for simvastatin - [facility]

## 2011-08-19 ENCOUNTER — Encounter: Payer: Self-pay | Admitting: Internal Medicine

## 2011-08-20 ENCOUNTER — Ambulatory Visit (INDEPENDENT_AMBULATORY_CARE_PROVIDER_SITE_OTHER): Payer: Medicare Other | Admitting: Internal Medicine

## 2011-08-20 ENCOUNTER — Encounter: Payer: Self-pay | Admitting: Internal Medicine

## 2011-08-20 VITALS — BP 136/64 | HR 71 | Temp 98.2°F | Resp 14 | Wt 190.4 lb

## 2011-08-20 DIAGNOSIS — N259 Disorder resulting from impaired renal tubular function, unspecified: Secondary | ICD-10-CM

## 2011-08-20 DIAGNOSIS — Z8546 Personal history of malignant neoplasm of prostate: Secondary | ICD-10-CM

## 2011-08-20 DIAGNOSIS — I10 Essential (primary) hypertension: Secondary | ICD-10-CM

## 2011-08-20 DIAGNOSIS — E785 Hyperlipidemia, unspecified: Secondary | ICD-10-CM

## 2011-08-20 LAB — LIPID PANEL
LDL Cholesterol: 96 mg/dL (ref 0–99)
Total CHOL/HDL Ratio: 3
VLDL: 14.8 mg/dL (ref 0.0–40.0)

## 2011-08-20 MED ORDER — SIMVASTATIN 20 MG PO TABS: 20.0000 mg | ORAL_TABLET | Freq: Every day | ORAL | Status: DC

## 2011-08-20 NOTE — Assessment & Plan Note (Signed)
Due for labs, good medication compliance without apparent side effects. Doing well

## 2011-08-20 NOTE — Telephone Encounter (Signed)
Addended by: Verdene Rio on: 08/20/2011 02:37 PM   Modules accepted: Orders

## 2011-08-20 NOTE — Progress Notes (Signed)
  Subjective:    Patient ID: [name] Wood, male    DOB: [birth year], 70 y.o.   MRN: [id number]  HPI Prostate ca --s/p implant 06-2011, doing well, to see urology in 3 months Hypertension-- good medication compliance with meds. Ambulatory blood pressures within normal High cholesterol\good compliance with simvastatin, no myalgias that he can tell. Allergies--well controlled with Nasonex as needed  Past Medical History  Diagnosis Date  . Allergy     rhinitis  . Hyperlipidemia   . Hypertension   . Osteoarthritis   . Erectile dysfunction   . Overweight   . Renal insufficiency     baseline cr 1.5 11-06; cr 1.3 on 5-02  . Prostate cancer 2-11    prostate   Past Surgical History  Procedure Date  . Tonsillectomy 1967  . Hernia repair 2009    right     Review of Systems No chest pain or shortness of breath No  nausea, vomiting, diarrhea    Objective:   Physical Exam  Constitutional: He is oriented to person, place, and time. He appears well-developed and well-nourished. No distress.  HENT:  Head: Normocephalic and atraumatic.  Cardiovascular: Normal rate and normal heart sounds.   No murmur heard. Pulmonary/Chest: Breath sounds normal. No respiratory distress. He has no wheezes. He has no rales.  Abdominal: Soft. He exhibits no distension. There is no tenderness. There is no rebound and no guarding.  Musculoskeletal: He exhibits no edema.  Neurological: He is alert and oriented to person, place, and time.  Skin: He is not diaphoretic.  Psychiatric: He has a normal mood and affect. His behavior is normal. Judgment and thought content normal.          Assessment & Plan:

## 2011-08-20 NOTE — Assessment & Plan Note (Signed)
Last creatinine 06-2011 at baseline

## 2011-08-20 NOTE — Assessment & Plan Note (Signed)
Well controlled ,  chart review it he had a normal BMP ordered by a different provider ~ 8-12

## 2011-08-20 NOTE — Assessment & Plan Note (Signed)
Diagnosis  2 /2011, had seed implants 06-2011. Doing well.

## 2011-08-21 LAB — DIFFERENTIAL
Basophils Relative: 1
Eosinophils Absolute: 0.3
Monocytes Absolute: 0.5
Monocytes Relative: 10

## 2011-08-21 LAB — COMPREHENSIVE METABOLIC PANEL
ALT: 34
AST: 36
CO2: 28
Calcium: 9.2
GFR calc Af Amer: >60
GFR calc non Af Amer: 57 — ABNORMAL LOW
Sodium: 141

## 2011-08-21 LAB — CBC
MCHC: 34.3
RBC: 4 — ABNORMAL LOW
WBC: 5

## 2011-08-25 MED ORDER — SIMVASTATIN 20 MG PO TABS: 20.0000 mg | ORAL_TABLET | Freq: Every day | ORAL | Status: DC

## 2011-08-25 NOTE — Telephone Encounter (Signed)
Addended by: Candie Echevaria L on: 08/25/2011 10:43 AM   Modules accepted: Orders

## 2011-08-25 NOTE — Telephone Encounter (Signed)
Rx  Error. Resent to pharmacy

## 2011-08-27 ENCOUNTER — Telehealth: Payer: Self-pay | Admitting: *Deleted

## 2011-08-27 NOTE — Telephone Encounter (Signed)
Results Mailed.

## 2011-08-29 LAB — COMPREHENSIVE METABOLIC PANEL
ALT: 35 U/L (ref 0–53)
Calcium: 9.4 mg/dL (ref 8.4–10.5)
GFR calc Af Amer: >60 mL/min (ref >60)
Glucose, Bld: 109 mg/dL — ABNORMAL HIGH (ref 70–99)
Sodium: 142 mEq/L (ref 135–145)
Total Protein: 7.7 g/dL (ref 6.0–8.3)

## 2011-08-29 LAB — URINALYSIS, ROUTINE W REFLEX MICROSCOPIC
Glucose, UA: NEGATIVE mg/dL
Ketones, ur: NEGATIVE mg/dL
Specific Gravity, Urine: 1.017 (ref 1.005–1.030)
pH: 6.5 (ref 5.0–8.0)

## 2011-08-29 LAB — CBC
MCHC: 33.9 g/dL (ref 30.0–36.0)
RDW: 12.5 % (ref 11.5–15.5)

## 2011-08-29 LAB — DIFFERENTIAL
Eosinophils Absolute: 0.1 10*3/uL (ref 0.0–0.7)
Lymphs Abs: 1 10*3/uL (ref 0.7–4.0)
Monocytes Relative: 7 % (ref 3–12)
Neutrophils Relative %: 74 % (ref 43–77)

## 2011-09-02 ENCOUNTER — Other Ambulatory Visit: Payer: Self-pay | Admitting: Internal Medicine

## 2011-09-02 NOTE — Telephone Encounter (Signed)
Rx[s] Done. 

## 2011-09-16 ENCOUNTER — Ambulatory Visit
Admission: RE | Admit: 2011-09-16 | Discharge: 2011-09-16 | Disposition: A | Discharge status: Home / Self Care | Payer: Medicare Other | Source: Ambulatory Visit | Attending: Radiation Oncology | Admitting: Radiation Oncology

## 2011-09-16 DIAGNOSIS — C61 Malignant neoplasm of prostate: Secondary | ICD-10-CM | POA: Insufficient documentation

## 2011-10-05 ENCOUNTER — Encounter: Payer: Self-pay | Admitting: Radiation Oncology

## 2011-10-05 DIAGNOSIS — C61 Malignant neoplasm of prostate: Secondary | ICD-10-CM | POA: Diagnosis present

## 2011-10-05 NOTE — Progress Notes (Signed)
[facility] Radiation Oncology 3-D Planning Note Prostate Brachytherapy  Name: [name] Wood MRN: [id number] DOB: Apr 01, 1941  CC:  [name], MD  Diagnosis: 70 year old gentleman with stage T1c. adenocarcinoma of the prostate with a Gleason's score of 3+3 and PSA of 10.06  Narrative: [name] Wood returned following prostate seed implantation for post implant planning. He underwent CT scan to delineate the three-dimensional structures of the pelvis and demonstrate the radiation distribution.  Results:   Prostate Coverage - The dose of radiation delivered to the 90% or more of the prostate gland (D90) was 113.86% of the prescription dose. This exceeds our goal of greater than 90%. Rectal Sparing - The volume of rectal tissue receiving the prescription dose or higher was 0.38 cc. This falls under our thresholds tolerance of 1.0 cc.  Impression: [name] Wood's prostate seed implant appears to show adequate target coverage and appropriate rectal sparing.  Plan:  The patient will continue to follow with urology for ongoing PSA determinations. I would anticipate a high likelihood for local tumor control with minimal risk for rectal morbidity.  ---------------------------------- Artist Pais. Kathrynn Running, M.D. Radiation Oncology [phone]

## 2011-11-20 ENCOUNTER — Encounter: Payer: Self-pay | Admitting: *Deleted

## 2011-11-20 NOTE — Progress Notes (Signed)
06/12/11 IEFF RESULTS=[id number]

## 2011-11-21 ENCOUNTER — Encounter: Payer: Self-pay | Admitting: *Deleted

## 2011-11-25 DIAGNOSIS — C61 Malignant neoplasm of prostate: Secondary | ICD-10-CM

## 2011-11-25 HISTORY — DX: Malignant neoplasm of prostate: C61

## 2011-12-02 ENCOUNTER — Other Ambulatory Visit: Payer: Self-pay | Admitting: Internal Medicine

## 2011-12-02 MED ORDER — SIMVASTATIN 20 MG PO TABS: 20.0000 mg | ORAL_TABLET | Freq: Every day | ORAL | Status: DC

## 2011-12-02 NOTE — Telephone Encounter (Signed)
Rx sent 

## 2011-12-12 ENCOUNTER — Other Ambulatory Visit: Payer: Self-pay | Admitting: Internal Medicine

## 2011-12-12 MED ORDER — SIMVASTATIN 20 MG PO TABS: 20.0000 mg | ORAL_TABLET | Freq: Every day | ORAL | Status: DC

## 2011-12-12 NOTE — Telephone Encounter (Signed)
Re-printing and faxing to pharmacy.

## 2011-12-12 NOTE — Telephone Encounter (Signed)
Rx faxed on 12/02/2011.

## 2012-02-24 ENCOUNTER — Other Ambulatory Visit: Payer: Self-pay | Admitting: Radiation Oncology

## 2012-02-24 DIAGNOSIS — C61 Malignant neoplasm of prostate: Secondary | ICD-10-CM

## 2012-03-02 ENCOUNTER — Other Ambulatory Visit: Payer: Self-pay | Admitting: *Deleted

## 2012-03-02 MED ORDER — SIMVASTATIN 20 MG PO TABS: 20.0000 mg | ORAL_TABLET | Freq: Every day | ORAL | Status: DC

## 2012-03-02 MED ORDER — TRIAMTERENE-HCTZ 37.5-25 MG PO CAPS: ORAL_CAPSULE | ORAL | Status: DC

## 2012-03-02 MED ORDER — AMLODIPINE BESYLATE 10 MG PO TABS: 10.0000 mg | ORAL_TABLET | Freq: Every day | ORAL | Status: DC

## 2012-03-02 NOTE — Telephone Encounter (Signed)
Rx sent, Pt made aware OV due.

## 2012-06-08 ENCOUNTER — Encounter: Payer: Self-pay | Admitting: Internal Medicine

## 2012-06-08 ENCOUNTER — Ambulatory Visit (INDEPENDENT_AMBULATORY_CARE_PROVIDER_SITE_OTHER): Payer: Medicare Other | Admitting: Internal Medicine

## 2012-06-08 VITALS — BP 138/78 | HR 64 | Wt 193.0 lb

## 2012-06-08 DIAGNOSIS — J309 Allergic rhinitis, unspecified: Secondary | ICD-10-CM

## 2012-06-08 DIAGNOSIS — E785 Hyperlipidemia, unspecified: Secondary | ICD-10-CM

## 2012-06-08 DIAGNOSIS — I10 Essential (primary) hypertension: Secondary | ICD-10-CM

## 2012-06-08 DIAGNOSIS — N259 Disorder resulting from impaired renal tubular function, unspecified: Secondary | ICD-10-CM

## 2012-06-08 DIAGNOSIS — R0989 Other specified symptoms and signs involving the circulatory and respiratory systems: Secondary | ICD-10-CM

## 2012-06-08 LAB — BASIC METABOLIC PANEL
GFR: 54.55 mL/min — ABNORMAL LOW (ref >60.00)
Potassium: 3.6 mEq/L (ref 3.5–5.1)
Sodium: 141 mEq/L (ref 135–145)

## 2012-06-08 LAB — LIPID PANEL
HDL: 57.6 mg/dL (ref >39.00)
LDL Cholesterol: 107 mg/dL — ABNORMAL HIGH (ref 0–99)
VLDL: 12.4 mg/dL (ref 0.0–40.0)

## 2012-06-08 LAB — CBC WITH DIFFERENTIAL/PLATELET
Eosinophils Relative: 5.3 % — ABNORMAL HIGH (ref 0.0–5.0)
HCT: 38.3 % — ABNORMAL LOW (ref 39.0–52.0)
Hemoglobin: 12.9 g/dL — ABNORMAL LOW (ref 13.0–17.0)
Lymphocytes Relative: 27.5 % (ref 12.0–46.0)
Lymphs Abs: 1.1 10*3/uL (ref 0.7–4.0)
Monocytes Relative: 10.1 % (ref 3.0–12.0)
Neutro Abs: 2.3 10*3/uL (ref 1.4–7.7)
RBC: 4.13 Mil/uL — ABNORMAL LOW (ref 4.22–5.81)
WBC: 4.2 10*3/uL — ABNORMAL LOW (ref 4.5–10.5)

## 2012-06-08 LAB — AST: AST: 31 U/L (ref 0–37)

## 2012-06-08 LAB — ALT: ALT: 22 U/L (ref 0–53)

## 2012-06-08 MED ORDER — SIMVASTATIN 20 MG PO TABS: 20.0000 mg | ORAL_TABLET | Freq: Every day | ORAL | Status: DC

## 2012-06-08 MED ORDER — TRIAMTERENE-HCTZ 37.5-25 MG PO CAPS: 1.0000 | ORAL_CAPSULE | Freq: Every day | ORAL | Status: DC

## 2012-06-08 MED ORDER — AMLODIPINE BESYLATE 10 MG PO TABS: 10.0000 mg | ORAL_TABLET | Freq: Every day | ORAL | Status: DC

## 2012-06-08 NOTE — Assessment & Plan Note (Signed)
Well controlled with current regimen. No change

## 2012-06-08 NOTE — Assessment & Plan Note (Signed)
Reports good compliance with medication Plan-- refill medicines and labs

## 2012-06-08 NOTE — Assessment & Plan Note (Addendum)
Good medication compliance, ambulatory BPs in the 130/ 80s. No change, check a BMP

## 2012-06-08 NOTE — Assessment & Plan Note (Signed)
No bruit or mass appreciated today

## 2012-06-08 NOTE — Assessment & Plan Note (Signed)
At some point creatinine went up to 1.5, labs

## 2012-06-08 NOTE — Progress Notes (Signed)
  Subjective:    Patient ID: [name] Wood, male    DOB: [birth year], 71 y.o.   MRN: [id number]  HPI Routine office visit Chart reviewed Hypertension, good medication compliance, no apparent side effects, ambulatory BPs in the 130/80 range. High cholesterol, takes simvastatin daily without problems. Due for labs History of prostate cancer, sees urology regularly, on Flomax which worked very well for him.   Past Medical History: Allergic rhinitis Hyperlipidemia Hypertension  Osteoarthritis  Erectile dysfunction-  Renal insufficiency, Baseline Cr 1.5 11/06; Cr 1.3 5/02 Prostate cancer,dx 12/2009  Past Surgical History: Tonsillectomy- 1967 R  hernia repair 2009  Family History: DM --F Kidney disease. --F Mother: Died age 22   Alzheimer's disease-- M sister had cancer , type? prostate ca--no colon Ca--no  Social History: Lives in [place] with his wife.  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 No anxiety-depression.     Objective:   Physical Exam  General -- alert, well-developed, and well-nourished.   Neck --no thyromegaly Lungs -- normal respiratory effort, no intercostal retractions, no accessory muscle use, and normal breath sounds.   Heart-- normal rate, regular rhythm, no murmur, and no gallop.   Abdomen--soft, non-tender, no distention, no masses, no HSM, no guarding, and no rigidity. No bruit.  Extremities-- no pretibial edema bilaterally Neurologic-- alert & oriented X3 and strength normal in all extremities. Psych-- Cognition and judgment appear intact. Alert and cooperative with normal attention span and concentration.  not anxious appearing and not depressed appearing.       Assessment & Plan:

## 2012-06-10 ENCOUNTER — Encounter: Payer: Self-pay | Admitting: *Deleted

## 2012-11-15 ENCOUNTER — Other Ambulatory Visit: Payer: Self-pay | Admitting: Radiation Oncology

## 2012-12-10 ENCOUNTER — Telehealth: Payer: Self-pay | Admitting: Internal Medicine

## 2012-12-10 MED ORDER — SIMVASTATIN 20 MG PO TABS: 20.0000 mg | ORAL_TABLET | Freq: Every day | ORAL | Status: DC

## 2012-12-10 MED ORDER — AMLODIPINE BESYLATE 10 MG PO TABS: 10.0000 mg | ORAL_TABLET | Freq: Every day | ORAL | Status: DC

## 2012-12-10 MED ORDER — TRIAMTERENE-HCTZ 37.5-25 MG PO CAPS: 1.0000 | ORAL_CAPSULE | Freq: Every day | ORAL | Status: DC

## 2012-12-10 NOTE — Telephone Encounter (Signed)
The pt called hoping to get a call back from the nurse - [phone]

## 2012-12-10 NOTE — Telephone Encounter (Signed)
Spoke to pt, he is requesting refills sent into [facility] mailorder [facility].  Refill done.

## 2012-12-27 ENCOUNTER — Telehealth: Payer: Self-pay | Admitting: Internal Medicine

## 2012-12-27 MED ORDER — TRIAMTERENE-HCTZ 37.5-25 MG PO CAPS: 1.0000 | ORAL_CAPSULE | Freq: Every day | ORAL | Status: DC

## 2012-12-27 MED ORDER — SIMVASTATIN 20 MG PO TABS: 20.0000 mg | ORAL_TABLET | Freq: Every day | ORAL | Status: DC

## 2012-12-27 NOTE — Telephone Encounter (Signed)
Patient states he needs 7 days supply of simvastatin and triamterene-hctz sent to [facility] on [address]. His mail order rx will not arrive for 1 week and he is out.

## 2012-12-27 NOTE — Telephone Encounter (Signed)
Refill done.  

## 2013-02-10 ENCOUNTER — Ambulatory Visit (INDEPENDENT_AMBULATORY_CARE_PROVIDER_SITE_OTHER): Payer: Medicare Other | Admitting: Internal Medicine

## 2013-02-10 ENCOUNTER — Encounter: Payer: Self-pay | Admitting: Internal Medicine

## 2013-02-10 VITALS — BP 136/84 | HR 59 | Temp 98.1°F | Ht 71.0 in | Wt 191.0 lb

## 2013-02-10 DIAGNOSIS — E785 Hyperlipidemia, unspecified: Secondary | ICD-10-CM

## 2013-02-10 LAB — CBC WITH DIFFERENTIAL/PLATELET
Basophils Absolute: 0 10*3/uL (ref 0.0–0.1)
Eosinophils Absolute: 0.2 10*3/uL (ref 0.0–0.7)
Hemoglobin: 12.2 g/dL — ABNORMAL LOW (ref 13.0–17.0)
Lymphocytes Relative: 22.4 % (ref 12.0–46.0)
MCHC: 33.9 g/dL (ref 30.0–36.0)
Monocytes Relative: 9.8 % (ref 3.0–12.0)
Neutro Abs: 2.8 10*3/uL (ref 1.4–7.7)
Neutrophils Relative %: 62.4 % (ref 43.0–77.0)
Platelets: 349 10*3/uL (ref 150.0–400.0)
RDW: 13.3 % (ref 11.5–14.6)

## 2013-02-10 LAB — COMPREHENSIVE METABOLIC PANEL
AST: 35 U/L (ref 0–37)
Albumin: 4.4 g/dL (ref 3.5–5.2)
Alkaline Phosphatase: 46 U/L (ref 39–117)
BUN: 21 mg/dL (ref 6–23)
Potassium: 3.4 mEq/L — ABNORMAL LOW (ref 3.5–5.1)
Sodium: 139 mEq/L (ref 135–145)
Total Bilirubin: 0.9 mg/dL (ref 0.3–1.2)
Total Protein: 7.5 g/dL (ref 6.0–8.3)

## 2013-02-10 LAB — LIPID PANEL
Cholesterol: 162 mg/dL (ref 0–200)
LDL Cholesterol: 96 mg/dL (ref 0–99)
Total CHOL/HDL Ratio: 3
Triglycerides: 71 mg/dL (ref 0.0–149.0)
VLDL: 14.2 mg/dL (ref 0.0–40.0)

## 2013-02-10 LAB — TSH: TSH: 0.82 u[IU]/mL (ref 0.35–5.50)

## 2013-02-10 LAB — IRON: Iron: 101 ug/dL (ref 42–165)

## 2013-02-10 LAB — FERRITIN: Ferritin: 94.9 ng/mL (ref 22.0–322.0)

## 2013-02-10 MED ORDER — ZOSTER VACCINE LIVE 19400 UNT/0.65ML ~~LOC~~ SOLR: 0.6500 mL | Freq: Once | SUBCUTANEOUS | Status: DC

## 2013-02-10 NOTE — Assessment & Plan Note (Signed)
Good compliance with simvastatin,   We'll get a FLP

## 2013-02-10 NOTE — Assessment & Plan Note (Signed)
Well controlled, labs  

## 2013-02-10 NOTE — Progress Notes (Signed)
Subjective:    Patient ID: [name] Wood, male    DOB: [birth year], 72 y.o.   MRN: [id number]  HPI Here for Medicare AWV: 1. Risk factors based on Past M, S, F history: reviewed 2. Physical Activities: still works  , bowling   3. Depression/mood:  (-) screening  4. Hearing:  No problemss noted or reported  5. ADL's:  Independent  6. Fall Risk: low risk, see instructions  7. home Safety: does feel safe at home  8. Height, weight, &visual acuity: see VS, vision slt decreased , due for a opht visit, encouraged to do 9. Counseling: provided 10. Labs ordered based on risk factors: if needed  11. Referral Coordination: if needed 12.  Care Plan, see assessment and plan  13.   Cognitive Assessment: nor mal motor and cognition noted   In addition, today we discussed the following: Hypertension, good medication compliance, not ambulatory BPs. High cholesterol, good medication compliance, no apparent side effects. Allergies, takes medications as needed, symptoms well-controlled.  Past Medical History  Diagnosis Date  . Allergy     rhinitis  . Hyperlipidemia   . Hypertension   . Osteoarthritis   . Erectile dysfunction   . Overweight   . Renal insufficiency     baseline cr 1.5 11-06; cr 1.3 on 5-02  . Prostate cancer 2-11    prostate   Past Surgical History  Procedure Laterality Date  . Tonsillectomy  1967  . Hernia repair  2009    right  . Radioactive seed implant  ~2012   History   Social History  . Marital Status: Married    Spouse Name: N/A    Number of Children: 4  . Years of Education: N/A   Occupational History  . [employer]    Social History Main Topics  . Smoking status: Never Smoker   . Smokeless tobacco: Never Used  . Alcohol Use: Yes     Comment: very rarely   . Drug Use: No  . Sexually Active: Not on file   Other Topics Concern  . Not on file   Social History Narrative   Diet: healthy    Family History  Problem Relation Age of Onset  .  Alzheimer's disease Mother   . Diabetes Father   . Kidney disease Father   . Cancer Sister     ? type  . Colon cancer Neg Hx   . Prostate cancer Neg Hx   . CAD Neg Hx      Review of Systems Denies chest pain or shortness of breath. No nausea vomiting or diarrhea, no blood in the stools. No dysuria or gross hematuria.     Objective:   Physical Exam BP 136/84  Pulse 59  Temp(Src) 98.1 F (36.7 C) (Oral)  Ht 5\' 11"  (1.803 m)  Wt 191 lb (86.637 kg)  BMI 26.65 kg/m2  SpO2 95%  General -- alert, well-developed   Neck -- slightly enlarged thyroid gland? L>R; not tender or nodular Lungs -- normal respiratory effort, no intercostal retractions, no accessory muscle use, and normal breath sounds.   Heart-- normal rate, regular rhythm, no murmur, and no gallop.   Abdomen--soft, non-tender, no distention, no masses, no HSM, no guarding, and no rigidity.   Extremities-- no pretibial edema bilaterally  Neurologic-- alert & oriented X3 and strength normal in all extremities. Psych-- Cognition and judgment appear intact. Alert and cooperative with normal attention span and concentration.  not anxious appearing and not depressed appearing.  Assessment & Plan:  Td -- patient reported a Td aprox 2007 pneumonia shot --  not available today, asked the patient to call in 2 weeks. shingles shot -- Rx provided   continue w/ healthy life style    Cscope 8-07 @ [facility] (no records)-- reports it was completely normal , we couldn't get records

## 2013-02-10 NOTE — Assessment & Plan Note (Signed)
See physical exam, will get a ultrasound

## 2013-02-10 NOTE — Patient Instructions (Signed)
We don't have been pneumonia shot available today. Please call in 2 weeks.  Fall Prevention and Home Safety Falls cause injuries and can affect all age groups. It is possible to use preventive measures to significantly decrease the likelihood of falls. There are many simple measures which can make your home safer and prevent falls. OUTDOORS  Repair cracks and edges of walkways and driveways.  Remove high doorway thresholds.  Trim shrubbery on the main path into your home.  Have good outside lighting.  Clear walkways of tools, rocks, debris, and clutter.  Check that handrails are not broken and are securely fastened. Both sides of steps should have handrails.  Have leaves, snow, and ice cleared regularly.  Use sand or salt on walkways during winter months.  In the garage, clean up grease or oil spills. BATHROOM  Install night lights.  Install grab bars by the toilet and in the tub and shower.  Use non-skid mats or decals in the tub or shower.  Place a plastic non-slip stool in the shower to sit on, if needed.  Keep floors dry and clean up all water on the floor immediately.  Remove soap buildup in the tub or shower on a regular basis.  Secure bath mats with non-slip, double-sided rug tape.  Remove throw rugs and tripping hazards from the floors. BEDROOMS  Install night lights.  Make sure a bedside light is easy to reach.  Do not use oversized bedding.  Keep a telephone by your bedside.  Have a firm chair with side arms to use for getting dressed.  Remove throw rugs and tripping hazards from the floor. KITCHEN  Keep handles on pots and pans turned toward the center of the stove. Use back burners when possible.  Clean up spills quickly and allow time for drying.  Avoid walking on wet floors.  Avoid hot utensils and knives.  Position shelves so they are not too high or low.  Place commonly used objects within easy reach.  If necessary, use a sturdy step  stool with a grab bar when reaching.  Keep electrical cables out of the way.  Do not use floor polish or wax that makes floors slippery. If you must use wax, use non-skid floor wax.  Remove throw rugs and tripping hazards from the floor. STAIRWAYS  Never leave objects on stairs.  Place handrails on both sides of stairways and use them. Fix any loose handrails. Make sure handrails on both sides of the stairways are as long as the stairs.  Check carpeting to make sure it is firmly attached along stairs. Make repairs to worn or loose carpet promptly.  Avoid placing throw rugs at the top or bottom of stairways, or properly secure the rug with carpet tape to prevent slippage. Get rid of throw rugs, if possible.  Have an electrician put in a light switch at the top and bottom of the stairs. OTHER FALL PREVENTION TIPS  Wear low-heel or rubber-soled shoes that are supportive and fit well. Wear closed toe shoes.  When using a stepladder, make sure it is fully opened and both spreaders are firmly locked. Do not climb a closed stepladder.  Add color or contrast paint or tape to grab bars and handrails in your home. Place contrasting color strips on first and last steps.  Learn and use mobility aids as needed. Install an electrical emergency response system.  Turn on lights to avoid dark areas. Replace light bulbs that burn out immediately. Get light switches that  glow.  Arrange furniture to create clear pathways. Keep furniture in the same place.  Firmly attach carpet with non-skid or double-sided tape.  Eliminate uneven floor surfaces.  Select a carpet pattern that does not visually hide the edge of steps.  Be aware of all pets. OTHER HOME SAFETY TIPS  Set the water temperature for 120 F (48.8 C).  Keep emergency numbers on or near the telephone.  Keep smoke detectors on every level of the home and near sleeping areas. Document Released: 10/31/2002 Document Revised: 05/11/2012  Document Reviewed: 01/30/2012 [facility] Patient Information 2013 [place], [place].

## 2013-02-10 NOTE — Assessment & Plan Note (Signed)
Mild renal insufficiency, last hemoglobin is slightly low. Plan:  BMP, CBC, iron and ferritin. No recent renal ultrasound, will order one.

## 2013-02-11 ENCOUNTER — Encounter: Payer: Self-pay | Admitting: Internal Medicine

## 2013-02-11 DIAGNOSIS — Z0000 Encounter for general adult medical examination without abnormal findings: Secondary | ICD-10-CM | POA: Insufficient documentation

## 2013-02-11 NOTE — Assessment & Plan Note (Signed)
  Assessment & Plan:  Td -- patient reported a Td aprox 2007 pneumonia shot --  not available today, asked the patient to call in 2 weeks. shingles shot -- Rx provided   continue w/ healthy life style    Cscope 8-07 @ [facility] (no records)-- reports it was completely normal , we couldn't get records

## 2013-02-14 NOTE — Addendum Note (Signed)
Addended by: Edwena Felty T on: 02/14/2013 03:51 PM   Modules accepted: Orders

## 2013-02-17 ENCOUNTER — Other Ambulatory Visit: Payer: Medicare Other

## 2013-02-22 ENCOUNTER — Ambulatory Visit
Admission: RE | Admit: 2013-02-22 | Discharge: 2013-02-22 | Disposition: A | Discharge status: Home / Self Care | Payer: Medicare Other | Source: Ambulatory Visit | Attending: Internal Medicine | Admitting: Internal Medicine

## 2013-02-22 DIAGNOSIS — E010 Iodine-deficiency related diffuse (endemic) goiter: Secondary | ICD-10-CM

## 2013-02-22 DIAGNOSIS — N259 Disorder resulting from impaired renal tubular function, unspecified: Secondary | ICD-10-CM

## 2013-03-01 ENCOUNTER — Other Ambulatory Visit: Payer: Self-pay | Admitting: *Deleted

## 2013-03-16 ENCOUNTER — Telehealth: Payer: Self-pay | Admitting: Internal Medicine

## 2013-03-16 DIAGNOSIS — E049 Nontoxic goiter, unspecified: Secondary | ICD-10-CM

## 2013-03-16 NOTE — Telephone Encounter (Signed)
Pt wife called about her husband recent ct scan about a month ago. Pt never heard results from it. Please call patient thanks

## 2013-03-16 NOTE — Telephone Encounter (Signed)
Discussed with pt's wife.  Entered order.

## 2013-03-17 ENCOUNTER — Telehealth: Payer: Self-pay | Admitting: Internal Medicine

## 2013-03-17 DIAGNOSIS — E049 Nontoxic goiter, unspecified: Secondary | ICD-10-CM

## 2013-03-17 NOTE — Telephone Encounter (Signed)
Re-enterd order. Left detailed msg on pt's vmail.

## 2013-03-17 NOTE — Telephone Encounter (Signed)
Pt called and stated that imaging called him and stated that they needed dr Drue Novel to call his insurance about a procedure he is suppose to have. Pt stated that it was a thyroid procedure he was suppose to have. Please give pt a call back. thanks

## 2013-03-28 ENCOUNTER — Ambulatory Visit: Payer: Medicare Other | Admitting: Internal Medicine

## 2013-03-30 ENCOUNTER — Other Ambulatory Visit (HOSPITAL_COMMUNITY)
Admission: RE | Admit: 2013-03-30 | Discharge: 2013-03-30 | Disposition: A | Discharge status: Home / Self Care | Payer: Medicare Other | Source: Ambulatory Visit | Attending: Diagnostic Radiology | Admitting: Diagnostic Radiology

## 2013-03-30 ENCOUNTER — Ambulatory Visit
Admission: RE | Admit: 2013-03-30 | Discharge: 2013-03-30 | Disposition: A | Discharge status: Home / Self Care | Payer: Medicare Other | Source: Ambulatory Visit | Attending: Internal Medicine | Admitting: Internal Medicine

## 2013-03-30 DIAGNOSIS — E049 Nontoxic goiter, unspecified: Secondary | ICD-10-CM

## 2013-03-31 ENCOUNTER — Telehealth: Payer: Self-pay | Admitting: Internal Medicine

## 2013-03-31 DIAGNOSIS — D34 Benign neoplasm of thyroid gland: Secondary | ICD-10-CM

## 2013-03-31 NOTE — Telephone Encounter (Signed)
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 Surgery for excision Discussed with patient's wife

## 2013-04-01 ENCOUNTER — Telehealth: Payer: Self-pay | Admitting: Internal Medicine

## 2013-04-01 NOTE — Telephone Encounter (Signed)
D/w pt himself today

## 2013-04-01 NOTE — Telephone Encounter (Signed)
Pt wife called they need to know the name of the thyroid that is in trouble, neither pat nor wife can remember name

## 2013-04-01 NOTE — Telephone Encounter (Signed)
Discussed with pt

## 2013-04-25 ENCOUNTER — Ambulatory Visit (INDEPENDENT_AMBULATORY_CARE_PROVIDER_SITE_OTHER): Payer: Medicare Other | Admitting: Surgery

## 2013-04-25 ENCOUNTER — Encounter (INDEPENDENT_AMBULATORY_CARE_PROVIDER_SITE_OTHER): Payer: Self-pay | Admitting: Surgery

## 2013-04-25 VITALS — BP 138/74 | HR 68 | Temp 98.0°F | Resp 18 | Ht 71.0 in | Wt 194.6 lb

## 2013-04-25 DIAGNOSIS — D440 Neoplasm of uncertain behavior of thyroid gland: Secondary | ICD-10-CM

## 2013-04-25 DIAGNOSIS — D449 Neoplasm of uncertain behavior of unspecified endocrine gland: Secondary | ICD-10-CM

## 2013-04-25 NOTE — Patient Instructions (Signed)

## 2013-04-25 NOTE — Progress Notes (Signed)
General Surgery [facility]  Chief Complaint  [name] Wood presents with  . New Evaluation    Hurthle cell neoplasm of thyroid - referral from Dr. 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 [name] Wood was found to have a multinodular goiter on physical examination during a routine office visit. He underwent thyroid ultrasound in early April 2014. This showed a moderately enlarged thyroid gland containing bilateral thyroid nodules. The dominant nodule was in the inferior left lobe measuring 4.9 cm. [name] Wood underwent bilateral fine-needle aspiration biopsies. Cytopathology demonstrated prominent Hurthle cell change in the nodule in the right upper hold the thyroid gland consistent with a Hurthle cell neoplasm. Thyroid function is normal with a TSH level of 0.82. [name] Wood is now referred to surgery for discussion of resection for definitive diagnosis.  [name] Wood has multiple family members who have had a variety of thyroid problems. Most have not required surgery. There is no family history of thyroid cancer. There is no family history of other endocrine neoplasm.  [name] Wood has never been on thyroid medication. He has had no other head or neck surgery.  Past Medical History  Diagnosis Date  . Allergy     rhinitis  . Hyperlipidemia   . Hypertension   . Osteoarthritis   . Erectile dysfunction   . Overweight(278.02)   . Renal insufficiency     baseline cr 1.5 11-06; cr 1.3 on 5-02  . Prostate cancer 2-11    prostate  . Prostate ca 2013    seeding implant     Current Outpatient Prescriptions  Medication Sig Dispense Refill  . amLODipine (NORVASC) 10 MG tablet Take 1 tablet (10 mg total) by mouth daily.  90 tablet  1  . mometasone (NASONEX) 50 MCG/ACT nasal spray Place 2 sprays into the nose daily as needed.        . multivitamin (ONE-A-DAY MEN'S)  TABS Take 1 tablet by mouth daily.        . simvastatin (ZOCOR) 20 MG tablet Take 1 tablet (20 mg total) by mouth at bedtime.  7 tablet  0  . Tamsulosin HCl (FLOMAX) 0.4 MG CAPS TAKE ONE CAPSULE BY MOUTH AT BEDTIME  30 capsule  4  . triamterene-hydrochlorothiazide (DYAZIDE) 37.5-25 MG per capsule Take 1 each (1 capsule total) by mouth daily.  7 capsule  0  . Vitamin D, Ergocalciferol, (DRISDOL) 50000 UNITS CAPS Take 50,000 Units by mouth.      . zoster vaccine live, PF, (ZOSTAVAX) 19147 UNT/0.65ML injection Inject 19,400 Units into the skin once.  1 each  0  . fexofenadine (ALLEGRA) 180 MG tablet Take 180 mg by mouth daily as needed.         No current facility-administered medications for this visit.     No Known Allergies   Family History  Problem Relation Age of Onset  . Alzheimer's disease Mother   . Diabetes Father   . Kidney disease Father   . Cancer Sister     ? type  . Colon cancer Neg Hx   . Prostate cancer Neg Hx   . CAD Neg Hx      History   Social History  . Marital Status: Married    Spouse Name: N/A    Number of Children: 4  . Years of Education: N/A   Occupational History  . [employer]    Social  History Main Topics  . Smoking status: Never Smoker   . Smokeless tobacco: Never Used  . Alcohol Use: Yes     Comment: very rarely   . Drug Use: No  . Sexually Active: None   Other Topics Concern  . None   Social History Narrative   Diet: healthy      REVIEW OF SYSTEMS - PERTINENT POSITIVES ONLY: Denies tremor. Denies palpitations. Denies compressive symptoms.  EXAM: Filed Vitals:   04/25/13 1327  BP: 138/74  Pulse: 68  Temp: 98 F (36.7 C)  Resp: 18    HEENT: normocephalic; pupils equal and reactive; sclerae clear; dentition good; mucous membranes moist NECK:  Prominent thyroid gland bilaterally, relatively soft, multinodular, moderately enlarged extending beneath the clavicle bilaterally; symmetric on extension; no palpable anterior  or posterior cervical lymphadenopathy; no supraclavicular masses; no tenderness CHEST: clear to auscultation bilaterally without rales, rhonchi, or wheezes CARDIAC: regular rate and rhythm without significant murmur; peripheral pulses are full EXT:  non-tender without edema; no deformity NEURO: no gross focal deficits; no sign of tremor   LABORATORY RESULTS: See [facility] (CHL-Epic) for most recent results   RADIOLOGY RESULTS: See [facility] (CHL-Epic) for most recent results   IMPRESSION: #1 multinodular goiter without compressive symptoms #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 I provided him with written literature to review regarding thyroid surgery. I explained to them the significance of the Hurthle cell findings on cytopathology. We discussed the surgical procedure and I have recommended total thyroidectomy given the size of his goiter and the bilateral nature of the thyroid nodules. We discussed risk and benefits of the procedure including potential for recurrent laryngeal nerve injury and injury to parathyroid glands. We discussed the hospital stay to be anticipated and his postoperative recovery. We discussed the need for lifelong thyroid hormone replacement. We discussed the potential need for radioactive iodine treatment in the event of malignancy. They understand and wish to proceed in the near future.  The risks and benefits of the procedure have been discussed at length with the [name] Wood.  The [name] Wood understands the proposed procedure, potential alternative treatments, and the course of recovery to be expected.  All of the [name] Wood's questions have been answered at this time.  The [name] Wood wishes to proceed with surgery.  Velora Heckler, MD, FACS General & Endocrine Surgery [facility]   Visit Diagnoses: 1. Neoplasm of uncertain behavior of thyroid gland, Hurthle cell  neoplasm     Primary Care Physician: [name], MD

## 2013-05-23 ENCOUNTER — Other Ambulatory Visit: Payer: Self-pay | Admitting: Radiation Oncology

## 2013-05-23 DIAGNOSIS — C61 Malignant neoplasm of prostate: Secondary | ICD-10-CM

## 2013-05-24 ENCOUNTER — Encounter (HOSPITAL_COMMUNITY): Payer: Self-pay | Admitting: Pharmacy Technician

## 2013-05-31 ENCOUNTER — Encounter (HOSPITAL_COMMUNITY)
Admission: RE | Admit: 2013-05-31 | Discharge: 2013-05-31 | Disposition: A | Discharge status: Home / Self Care | Payer: Medicare Other | Source: Ambulatory Visit | Attending: Surgery | Admitting: Surgery

## 2013-05-31 ENCOUNTER — Encounter (HOSPITAL_COMMUNITY): Payer: Self-pay

## 2013-05-31 ENCOUNTER — Ambulatory Visit (HOSPITAL_COMMUNITY)
Admission: RE | Admit: 2013-05-31 | Discharge: 2013-05-31 | Disposition: A | Discharge status: Home / Self Care | Payer: Medicare Other | Source: Ambulatory Visit | Attending: Surgery | Admitting: Surgery

## 2013-05-31 DIAGNOSIS — E049 Nontoxic goiter, unspecified: Secondary | ICD-10-CM

## 2013-05-31 DIAGNOSIS — E042 Nontoxic multinodular goiter: Secondary | ICD-10-CM | POA: Insufficient documentation

## 2013-05-31 DIAGNOSIS — Z01810 Encounter for preprocedural cardiovascular examination: Secondary | ICD-10-CM | POA: Insufficient documentation

## 2013-05-31 DIAGNOSIS — Z01818 Encounter for other preprocedural examination: Secondary | ICD-10-CM | POA: Insufficient documentation

## 2013-05-31 DIAGNOSIS — I10 Essential (primary) hypertension: Secondary | ICD-10-CM | POA: Insufficient documentation

## 2013-05-31 HISTORY — DX: Nontoxic goiter, unspecified: E04.9

## 2013-05-31 LAB — CBC
HCT: 37.4 % — ABNORMAL LOW (ref 39.0–52.0)
Hemoglobin: 12.6 g/dL — ABNORMAL LOW (ref 13.0–17.0)
MCH: 31 pg (ref 26.0–34.0)
MCHC: 33.7 g/dL (ref 30.0–36.0)
RDW: 12.9 % (ref 11.5–15.5)

## 2013-05-31 LAB — BASIC METABOLIC PANEL
BUN: 22 mg/dL (ref 6–23)
Calcium: 9.7 mg/dL (ref 8.4–10.5)
GFR calc Af Amer: 43 mL/min — ABNORMAL LOW (ref >90)
GFR calc non Af Amer: 37 mL/min — ABNORMAL LOW (ref >90)
Glucose, Bld: 101 mg/dL — ABNORMAL HIGH (ref 70–99)
Potassium: 3.7 mEq/L (ref 3.5–5.1)

## 2013-05-31 NOTE — Patient Instructions (Addendum)
20 KAYLOR SIMENSON  05/31/2013   Your procedure is scheduled on:  7-11 -2014  Report to [facility] at     0530   AM .  Call this number if you have problems the morning of surgery: [phone]  Or Presurgical Testing [phone](Aliany Fiorenza)      Do not eat food:After Midnight.    Take these medicines the morning of surgery with A SIP OF WATER: Nasonex nasal spray/bring. Eye drops/bring. Allegra- if needed.   Do not wear jewelry, make-up or nail polish.  Do not wear lotions, powders, or perfumes. You may wear deodorant.  Do not shave 12 hours prior to first CHG shower(legs and under arms).(face and neck okay.)  Do not bring valuables to the hospital.  Contacts, dentures or bridgework,body piercing,  may not be worn into surgery.  Leave suitcase in the car. After surgery it may be brought to your room.  For patients admitted to the hospital, checkout time is 11:00 AM the day of discharge.   Patients discharged the day of surgery will not be allowed to drive home. Must have responsible person with you x 24 hours once discharged.  Name and phone number of your driver: Armani Brar, spouse [phone] cell  Special Instructions: CHG(Chlorhedine 4%-"Hibiclens","Betasept","Aplicare") Shower Use Special Wash: see special instructions.(avoid face and genitals)   Please read over the following fact sheets that you were given: MRSA Information, Blood Transfusion fact sheet, Incentive Spirometry Instruction.    Failure to follow these instructions may result in Cancellation of your surgery.   Patient signature_______________________________________________________

## 2013-05-31 NOTE — Pre-Procedure Instructions (Signed)
05-31-13 EKG/ CXR done today.

## 2013-06-03 ENCOUNTER — Ambulatory Visit (HOSPITAL_COMMUNITY): Payer: Medicare Other | Admitting: *Deleted

## 2013-06-03 ENCOUNTER — Encounter (HOSPITAL_COMMUNITY): Payer: Self-pay | Admitting: *Deleted

## 2013-06-03 ENCOUNTER — Encounter (HOSPITAL_COMMUNITY)
Admission: RE | Disposition: A | Discharge status: Home / Self Care | Payer: Self-pay | Source: Ambulatory Visit | Attending: Surgery

## 2013-06-03 ENCOUNTER — Observation Stay (HOSPITAL_COMMUNITY)
Admission: RE | Admit: 2013-06-03 | Discharge: 2013-06-04 | Disposition: A | Discharge status: Home / Self Care | Payer: Medicare Other | Source: Ambulatory Visit | Attending: Surgery | Admitting: Surgery

## 2013-06-03 DIAGNOSIS — D34 Benign neoplasm of thyroid gland: Principal | ICD-10-CM | POA: Insufficient documentation

## 2013-06-03 DIAGNOSIS — I10 Essential (primary) hypertension: Secondary | ICD-10-CM | POA: Insufficient documentation

## 2013-06-03 DIAGNOSIS — Z79899 Other long term (current) drug therapy: Secondary | ICD-10-CM | POA: Insufficient documentation

## 2013-06-03 DIAGNOSIS — E785 Hyperlipidemia, unspecified: Secondary | ICD-10-CM | POA: Insufficient documentation

## 2013-06-03 DIAGNOSIS — D440 Neoplasm of uncertain behavior of thyroid gland: Secondary | ICD-10-CM | POA: Diagnosis present

## 2013-06-03 DIAGNOSIS — C73 Malignant neoplasm of thyroid gland: Secondary | ICD-10-CM

## 2013-06-03 DIAGNOSIS — C61 Malignant neoplasm of prostate: Secondary | ICD-10-CM | POA: Insufficient documentation

## 2013-06-03 DIAGNOSIS — E042 Nontoxic multinodular goiter: Secondary | ICD-10-CM | POA: Insufficient documentation

## 2013-06-03 DIAGNOSIS — R112 Nausea with vomiting, unspecified: Secondary | ICD-10-CM | POA: Insufficient documentation

## 2013-06-03 HISTORY — PX: THYROIDECTOMY: SHX17

## 2013-06-03 SURGERY — THYROIDECTOMY
Anesthesia: General | Site: Neck | Wound class: Clean

## 2013-06-03 MED ORDER — PROMETHAZINE HCL 25 MG/ML IJ SOLN: 6.2500 mg | INTRAMUSCULAR | Status: DC | PRN

## 2013-06-03 MED ORDER — CEFAZOLIN SODIUM-DEXTROSE 2-3 GM-% IV SOLR
2.0000 g | INTRAVENOUS | Status: AC
  Administered 2013-06-03: 2 g via INTRAVENOUS

## 2013-06-03 MED ORDER — LIDOCAINE HCL (CARDIAC) 20 MG/ML IV SOLN
INTRAVENOUS | Status: DC | PRN
  Administered 2013-06-03: 50 mg via INTRAVENOUS

## 2013-06-03 MED ORDER — MIDAZOLAM HCL 5 MG/5ML IJ SOLN
INTRAMUSCULAR | Status: DC | PRN
  Administered 2013-06-03 (×2): 1 mg via INTRAVENOUS

## 2013-06-03 MED ORDER — ONDANSETRON HCL 4 MG/2ML IJ SOLN: 4.0000 mg | Freq: Four times a day (QID) | INTRAMUSCULAR | Status: DC | PRN

## 2013-06-03 MED ORDER — TRIAMTERENE-HCTZ 37.5-25 MG PO CAPS
1.0000 | ORAL_CAPSULE | Freq: Every evening | ORAL | Status: DC
  Administered 2013-06-03: 1 via ORAL
  Filled 2013-06-03 (×2): qty 1

## 2013-06-03 MED ORDER — ROCURONIUM BROMIDE 100 MG/10ML IV SOLN
INTRAVENOUS | Status: DC | PRN
  Administered 2013-06-03: 50 mg via INTRAVENOUS
  Administered 2013-06-03 (×2): 15 mg via INTRAVENOUS

## 2013-06-03 MED ORDER — AMLODIPINE BESYLATE 10 MG PO TABS
10.0000 mg | ORAL_TABLET | Freq: Every evening | ORAL | Status: DC
  Administered 2013-06-03: 10 mg via ORAL
  Filled 2013-06-03 (×2): qty 1

## 2013-06-03 MED ORDER — LACTATED RINGERS IV SOLN
INTRAVENOUS | Status: DC | PRN
  Administered 2013-06-03 (×2): via INTRAVENOUS

## 2013-06-03 MED ORDER — LEVOTHYROXINE SODIUM 100 MCG PO TABS
100.0000 ug | ORAL_TABLET | Freq: Every day | ORAL | Status: DC
  Administered 2013-06-04: 100 ug via ORAL
  Filled 2013-06-03 (×2): qty 1

## 2013-06-03 MED ORDER — ONDANSETRON HCL 4 MG PO TABS
4.0000 mg | ORAL_TABLET | Freq: Four times a day (QID) | ORAL | Status: DC | PRN
  Administered 2013-06-04: 4 mg via ORAL
  Filled 2013-06-03: qty 1

## 2013-06-03 MED ORDER — CALCIUM CARBONATE 1250 (500 CA) MG PO TABS
2.0000 | ORAL_TABLET | Freq: Three times a day (TID) | ORAL | Status: DC
  Administered 2013-06-03 – 2013-06-04 (×3): 1000 mg via ORAL
  Filled 2013-06-03 (×6): qty 2

## 2013-06-03 MED ORDER — GLYCOPYRROLATE 0.2 MG/ML IJ SOLN
INTRAMUSCULAR | Status: DC | PRN
  Administered 2013-06-03: .8 mg via INTRAVENOUS
  Administered 2013-06-03: 0.1 mg via INTRAVENOUS

## 2013-06-03 MED ORDER — HYDROMORPHONE HCL PF 1 MG/ML IJ SOLN: 1.0000 mg | INTRAMUSCULAR | Status: DC | PRN

## 2013-06-03 MED ORDER — FENTANYL CITRATE 0.05 MG/ML IJ SOLN
INTRAMUSCULAR | Status: DC | PRN
  Administered 2013-06-03: 100 ug via INTRAVENOUS
  Administered 2013-06-03 (×3): 50 ug via INTRAVENOUS

## 2013-06-03 MED ORDER — FENTANYL CITRATE 0.05 MG/ML IJ SOLN
INTRAMUSCULAR | Status: AC
  Filled 2013-06-03: qty 2

## 2013-06-03 MED ORDER — HYDROCODONE-ACETAMINOPHEN 5-325 MG PO TABS
1.0000 | ORAL_TABLET | ORAL | Status: DC | PRN
  Administered 2013-06-03 (×2): 2 via ORAL
  Filled 2013-06-03 (×2): qty 2

## 2013-06-03 MED ORDER — LACTATED RINGERS IV SOLN: INTRAVENOUS | Status: DC

## 2013-06-03 MED ORDER — ACETAMINOPHEN 325 MG PO TABS: 650.0000 mg | ORAL_TABLET | ORAL | Status: DC | PRN

## 2013-06-03 MED ORDER — FENTANYL CITRATE 0.05 MG/ML IJ SOLN
25.0000 ug | INTRAMUSCULAR | Status: DC | PRN
  Administered 2013-06-03 (×2): 50 ug via INTRAVENOUS

## 2013-06-03 MED ORDER — CEFAZOLIN SODIUM-DEXTROSE 2-3 GM-% IV SOLR
INTRAVENOUS | Status: AC
  Filled 2013-06-03: qty 100

## 2013-06-03 MED ORDER — MEPERIDINE HCL 50 MG/ML IJ SOLN: 6.2500 mg | INTRAMUSCULAR | Status: DC | PRN

## 2013-06-03 MED ORDER — KCL IN DEXTROSE-NACL 30-5-0.45 MEQ/L-%-% IV SOLN
INTRAVENOUS | Status: DC
  Administered 2013-06-03: 14:00:00 via INTRAVENOUS
  Administered 2013-06-04: 75 mL/h via INTRAVENOUS
  Filled 2013-06-03 (×6): qty 1000

## 2013-06-03 MED ORDER — ONDANSETRON HCL 4 MG/2ML IJ SOLN
INTRAMUSCULAR | Status: DC | PRN
  Administered 2013-06-03: 4 mg via INTRAVENOUS

## 2013-06-03 MED ORDER — PROPOFOL 10 MG/ML IV BOLUS
INTRAVENOUS | Status: DC | PRN
  Administered 2013-06-03: 120 mg via INTRAVENOUS

## 2013-06-03 SURGICAL SUPPLY — 38 items
APL SKNCLS STERI-STRIP NONHPOA (GAUZE/BANDAGES/DRESSINGS) ×1
ATTRACTOMAT 16X20 MAGNETIC DRP (DRAPES) ×2 IMPLANT
BENZOIN TINCTURE PRP APPL 2/3 (GAUZE/BANDAGES/DRESSINGS) ×2 IMPLANT
BLADE HEX COATED 2.75 (ELECTRODE) ×2 IMPLANT
BLADE SURG 15 STRL LF DISP TIS (BLADE) ×1 IMPLANT
BLADE SURG 15 STRL SS (BLADE) ×2
CANISTER SUCTION 2500CC (MISCELLANEOUS) ×1 IMPLANT
CHLORAPREP W/TINT 10.5 ML (MISCELLANEOUS) ×2 IMPLANT
CLIP TI MEDIUM 6 (CLIP) ×6 IMPLANT
CLIP TI WIDE RED SMALL 6 (CLIP) ×7 IMPLANT
CLOTH BEACON ORANGE TIMEOUT ST (SAFETY) ×2 IMPLANT
DISSECTOR ROUND CHERRY 3/8 STR (MISCELLANEOUS) IMPLANT
DRAPE PED LAPAROTOMY (DRAPES) ×2 IMPLANT
DRESSING SURGICEL FIBRLLR 1X2 (HEMOSTASIS) ×1 IMPLANT
DRSG SURGICEL FIBRILLAR 1X2 (HEMOSTASIS) ×2
ELECT REM PT RETURN 9FT ADLT (ELECTROSURGICAL) ×2
ELECTRODE REM PT RTRN 9FT ADLT (ELECTROSURGICAL) ×1 IMPLANT
GAUZE SPONGE 4X4 16PLY XRAY LF (GAUZE/BANDAGES/DRESSINGS) ×2 IMPLANT
GLOVE SURG ORTHO 8.0 STRL STRW (GLOVE) ×8 IMPLANT
GOWN STRL NON-REIN LRG LVL3 (GOWN DISPOSABLE) ×2 IMPLANT
GOWN STRL REIN XL XLG (GOWN DISPOSABLE) ×5 IMPLANT
KIT BASIN OR (CUSTOM PROCEDURE TRAY) ×2 IMPLANT
NS IRRIG 1000ML POUR BTL (IV SOLUTION) ×2 IMPLANT
PACK BASIC VI WITH GOWN DISP (CUSTOM PROCEDURE TRAY) ×2 IMPLANT
PENCIL BUTTON HOLSTER BLD 10FT (ELECTRODE) ×2 IMPLANT
SHEARS HARMONIC 9CM CVD (BLADE) ×2 IMPLANT
SPONGE GAUZE 4X4 12PLY (GAUZE/BANDAGES/DRESSINGS) IMPLANT
STAPLER VISISTAT 35W (STAPLE) ×1 IMPLANT
STRIP CLOSURE SKIN 1/2X4 (GAUZE/BANDAGES/DRESSINGS) ×2 IMPLANT
SUT MNCRL AB 4-0 PS2 18 (SUTURE) ×2 IMPLANT
SUT SILK 2 0 (SUTURE) ×2
SUT SILK 2-0 18XBRD TIE 12 (SUTURE) ×1 IMPLANT
SUT SILK 3 0 (SUTURE)
SUT SILK 3-0 18XBRD TIE 12 (SUTURE) IMPLANT
SUT VIC AB 3-0 SH 18 (SUTURE) ×3 IMPLANT
SYR BULB IRRIGATION 50ML (SYRINGE) ×2 IMPLANT
TOWEL OR 17X26 10 PK STRL BLUE (TOWEL DISPOSABLE) ×2 IMPLANT
YANKAUER SUCT BULB TIP 10FT TU (MISCELLANEOUS) ×2 IMPLANT

## 2013-06-03 NOTE — Anesthesia Preprocedure Evaluation (Addendum)
Anesthesia Evaluation  Patient identified by MRN, date of birth, ID band Patient awake    Reviewed: Allergy & Precautions, H&P , NPO status , Patient's Chart, lab work & pertinent test results  Airway Mallampati: II TM Distance: >3 FB Neck ROM: Full    Dental no notable dental hx.    Pulmonary neg pulmonary ROS,  breath sounds clear to auscultation  Pulmonary exam normal       Cardiovascular hypertension, Pt. on medications Rhythm:Regular Rate:Normal     Neuro/Psych negative neurological ROS  negative psych ROS   GI/Hepatic negative GI ROS, Neg liver ROS,   Endo/Other  negative endocrine ROS  Renal/GU Renal InsufficiencyRenal disease  negative genitourinary   Musculoskeletal negative musculoskeletal ROS (+)   Abdominal   Peds negative pediatric ROS (+)  Hematology negative hematology ROS (+)   Anesthesia Other Findings   Reproductive/Obstetrics negative OB ROS                          Anesthesia Physical Anesthesia Plan  ASA: II  Anesthesia Plan: General   Post-op Pain Management:    Induction: Intravenous  Airway Management Planned: Oral ETT  Additional Equipment:   Intra-op Plan:   Post-operative Plan: Extubation in OR  Informed Consent: I have reviewed the patients History and Physical, chart, labs and discussed the procedure including the risks, benefits and alternatives for the proposed anesthesia with the patient or authorized representative who has indicated his/her understanding and acceptance.   Dental advisory given  Plan Discussed with: CRNA  Anesthesia Plan Comments:         Anesthesia Quick Evaluation

## 2013-06-03 NOTE — Brief Op Note (Signed)
06/03/2013  12:18 PM  PATIENT:  [name] Wood  72 y.o. male  PRE-OPERATIVE DIAGNOSIS:  multinodular goiter, Hurthle cell neoplasm  POST-OPERATIVE DIAGNOSIS:  multinodular goiter, Hurthle cell neoplasm  PROCEDURE:  Procedure(s): TOTAL THYROIDECTOMY (N/A)  SURGEON:  Surgeon(s) and Role:    * Velora Heckler, MD - Primary    * Robyne Askew, MD - Assisting  ANESTHESIA:   general  EBL:  Total I/O In: 1700 [I.V.:1700] Out: 150 [Blood:150]  BLOOD ADMINISTERED:none  DRAINS: none   LOCAL MEDICATIONS USED:  NONE  SPECIMEN:  Excision  DISPOSITION OF SPECIMEN:  PATHOLOGY  COUNTS:  YES  TOURNIQUET:  * No tourniquets in log *  DICTATION: .Other Dictation: Dictation Number See chart  PLAN OF CARE: Admit for overnight observation  PATIENT DISPOSITION:  PACU - hemodynamically stable.   Delay start of Pharmacological VTE agent (>24hrs) due to surgical blood loss or risk of bleeding: yes  Velora Heckler, MD, FACS General & Endocrine Surgery [facility] Office: [phone]

## 2013-06-03 NOTE — H&P (Signed)
[name] Wood is an 72 y.o. male.    General Surgery [facility]  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 Patient was found to have a multinodular goiter on physical examination during a routine office visit. He underwent thyroid ultrasound in early April 2014. This showed a moderately enlarged thyroid gland containing bilateral thyroid nodules. The dominant nodule was in the inferior left lobe measuring 4.9 cm. Patient underwent bilateral fine-needle aspiration biopsies. Cytopathology demonstrated prominent Hurthle cell change in the nodule in the right upper hold the thyroid gland consistent with a Hurthle cell neoplasm. Thyroid function is normal with a TSH level of 0.82. Patient is now referred to surgery for discussion of resection for definitive diagnosis.  Patient has multiple family members who have had a variety of thyroid problems. Most have not required surgery. There is no family history of thyroid cancer. There is no family history of other endocrine neoplasm.  Patient has never been on thyroid medication. He has had no other head or neck surgery.  Past Medical History  Diagnosis Date  . Allergy     rhinitis  . Hyperlipidemia   . Hypertension   . Osteoarthritis   . Erectile dysfunction   . Overweight(278.02)   . Renal insufficiency     baseline cr 1.5 11-06; cr 1.3 on 5-02  . Prostate cancer 2-11    prostate  . Prostate ca 2013    seeding implant  . Thyroid goiter 05-31-13    multinodular    Past Surgical History  Procedure Laterality Date  . Tonsillectomy  1967  . Radioactive seed implant  ~2012  . Hernia repair  2009    right    Family History  Problem Relation Age of Onset  . Alzheimer's disease Mother   . Diabetes Father   . Kidney disease Father   . Cancer Sister     ? type   . Colon cancer Neg Hx   . Prostate cancer Neg Hx   . CAD Neg Hx    Social History:  reports that he has never smoked. He has never used smokeless tobacco. He reports that  drinks alcohol. He reports that he does not use illicit drugs.  Allergies: No Known Allergies  Medications Prior to Admission  Medication Sig Dispense Refill  . amLODipine (NORVASC) 10 MG tablet Take 10 mg by mouth every evening.      . fexofenadine (ALLEGRA) 180 MG tablet Take 180 mg by mouth daily.      . mometasone (NASONEX) 50 MCG/ACT nasal spray Place 2 sprays into the nose daily as needed.        . polyvinyl alcohol (LIQUIFILM TEARS) 1.4 % ophthalmic solution Place 1 drop into both eyes daily as needed (for allergies).      . simvastatin (ZOCOR) 20 MG tablet Take 20 mg by mouth at bedtime.      . tamsulosin (FLOMAX) 0.4 MG CAPS Take 0.4 mg by mouth daily as needed.      . triamterene-hydrochlorothiazide (DYAZIDE) 37.5-25 MG per capsule Take 1 capsule by mouth every evening.      . Calcium Carbonate-Vitamin D (CALCIUM-VITAMIN D) 500-200 MG-UNIT per tablet Take 1 tablet by mouth 2 (two) times daily with a meal.      . cholecalciferol (VITAMIN D) 1000 UNITS tablet Take 1,000 Units by mouth daily.      Marland Kitchen  Misc Natural Products (COLON CLEANSE) CAPS Take 1 capsule by mouth daily.      . multivitamin (ONE-A-DAY MEN'S) TABS Take 1 tablet by mouth daily.          No results found for this or any previous visit (from the past 48 hour(s)). No results found.  Review of Systems  Constitutional: Negative.   HENT: Negative.   Eyes: Negative.   Respiratory: Negative.   Cardiovascular: Negative.   Gastrointestinal: Negative.   Genitourinary: Negative.   Musculoskeletal: Negative.   Skin: Negative.   Neurological: Negative.   Endo/Heme/Allergies: Negative.   Psychiatric/Behavioral: Negative.     Blood pressure 134/65, pulse 66, temperature 97.7 F (36.5 C), temperature source Oral, resp. rate 18, SpO2  100.00%. Physical Exam  Constitutional: He is oriented to person, place, and time. He appears well-developed and well-nourished. No distress.  HENT:  Head: Normocephalic and atraumatic.  Right Ear: External ear normal.  Left Ear: External ear normal.  Mouth/Throat: Oropharynx is clear and moist.  Eyes: Conjunctivae are normal. Pupils are equal, round, and reactive to light. No scleral icterus.  Neck: Normal range of motion. Neck supple. No tracheal deviation present. Thyromegaly (bilateral thyroid nodules palpable) present.  Cardiovascular: Normal rate, regular rhythm and normal heart sounds.   No murmur heard. Respiratory: Effort normal and breath sounds normal. He has no wheezes.  GI: Soft. Bowel sounds are normal. He exhibits no distension.  Musculoskeletal: Normal range of motion. He exhibits no edema.  Lymphadenopathy:    He has no cervical adenopathy.  Neurological: He is alert and oriented to person, place, and time.  Skin: Skin is warm and dry.  Psychiatric: He has a normal mood and affect. His behavior is normal.     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 I provided him with written literature to review regarding thyroid surgery. I explained to them the significance of the Hurthle cell findings on cytopathology. We discussed the surgical procedure and I have recommended total thyroidectomy given the size of his goiter and the bilateral nature of the thyroid nodules. We discussed risk and benefits of the procedure including potential for recurrent laryngeal nerve injury and injury to parathyroid glands. We discussed the hospital stay to be anticipated and his postoperative recovery. We discussed the need for lifelong thyroid hormone replacement. We discussed the potential need for radioactive iodine treatment in the event of malignancy.  They understand and wish to proceed in the near future.   The risks and benefits of the procedure have been discussed at length with the patient. The patient understands the proposed procedure, potential alternative treatments, and the course of recovery to be expected. All of the patient's questions have been answered at this time. The patient wishes to proceed with surgery.   Velora Heckler, MD, FACS  General & Endocrine Surgery  [facility]  Greg Eckrich M 06/03/2013, 7:07 AM

## 2013-06-03 NOTE — Transfer of Care (Signed)
Immediate Anesthesia Transfer of Care Note  Patient: [name] Wood  Procedure(s) Performed: Procedure(s): TOTAL THYROIDECTOMY (N/A)  Patient Location: PACU  Anesthesia Type:General  Level of Consciousness: awake, alert , oriented and patient cooperative  Airway & Oxygen Therapy: Patient Spontanous Breathing and Patient connected to face mask oxygen  Post-op Assessment: Report given to PACU RN, Post -op Vital signs reviewed and stable and Patient moving all extremities  Post vital signs: Reviewed and stable  Complications: No apparent anesthesia complications

## 2013-06-03 NOTE — Op Note (Signed)
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  PROCEDURE:  Total thyroidectomy.  SURGEON:  [name] Heckler, MD, FACS  ASSISTANT:  Ollen Gross. [name], MD, FACS  ANESTHESIA:  General per Phillips Grout, MD  ESTIMATED BLOOD LOSS:  150 mL.  PREPARATION:  ChloraPrep.  COMPLICATIONS:  None.  INDICATIONS:  The patient is a 72 year old black male, referred with multinodular thyroid goiter and Hurthle cell neoplasm.  The patient had been found to have a multinodular goiter on routine physical examination.  He underwent a thyroid ultrasound in April 2014, showing an enlarged thyroid gland, containing bilateral thyroid nodules.  Fine- needle aspiration biopsies were obtained and demonstrated a Hurthle cell neoplasm in the right thyroid lobe.  The patient now comes to surgery for resection for definitive diagnosis.  DESCRIPTION OF PROCEDURE:  Procedure was done in the OR #11 at the [facility].  The patient was brought to the operating room, placed in supine position on the operating room table. Following administration of general anesthesia, the patient was positioned and then prepped and draped in the usual strict aseptic fashion.  After ascertaining that an adequate level of anesthesia had been achieved, a Kocher incision was made with a #15 blade.  Dissection was carried through subcutaneous tissues and platysma.  Hemostasis was obtained with the electrocautery.  Skin flaps were elevated cephalad and caudad from the thyroid notch to the sternal notch.  A  Mahorner self- retaining retractor was placed for exposure.  Strap muscles were incised in the midline and dissection was begun on the left side.  Left thyroid lobe was moderately enlarged.  It was multinodular.  It was gently mobilized and strap muscles were reflected laterally.  Superior pole was dissected out and superior pole vessels divided individually between small and medium Ligaclips with the Harmonic scalpel.  Superior parathyroid gland was identified and preserved.  Inferior venous tributaries were divided between Ligaclips.  Gland was gently dissected out and rotated anteriorly.  Branches of the inferior thyroid artery were divided between small Ligaclips with the Harmonic scalpel. Recurrent laryngeal nerve was identified and preserved.  Gland was mobilized onto the anterior trachea.  Ligament of Allyson Sabal was released with the electrocautery.  A small remnant of thyroid tissue was left in situ adjacent to the recurrent nerve on the left side.  Isthmus was mobilized across the midline.  There does not appear to be any substantial pyramidal lobe present.  Dry pack was placed in the left neck.  Next, we turned our attention to the right thyroid lobe.  Again, strap muscles were reflected laterally.  Right lobe was also multinodular. Venous tributaries were divided between Ligaclips.  Superior pole vessels were dissected out and divided individually between small and medium Ligaclips  with the Harmonic scalpel.  Gland was rolled anteriorly.  Parathyroid tissue was identified and preserved.  Branches of the inferior thyroid artery were divided between small Ligaclips. There was a large nodule located very posteriorly.  This was gently mobilized.  It appears to be somewhat detached from the main thyroid tissue.  It was resected and submitted separately, labeled as portion of the right thyroid lobe.  Branches of the inferior thyroid artery were dissected out and divided individually.   Recurrent nerve was identified and preserved.  Ligament of Allyson Sabal was released with the electrocautery and the gland was mobilized onto the anterior trachea.  Remaining inferior venous tributaries were divided between medium Ligaclips with the Harmonic scalpel and the gland was completely excised off the trachea.  Suture was used to mark the right superior pole of the thyroid.  The entire gland was submitted to Pathology for review.  The neck was irrigated with warm saline.  Good hemostasis was achieved bilaterally.  Fibrillar was placed throughout the operative field. Strap muscles were reapproximated in the midline with interrupted 3-0 Vicryl sutures.  Platysma was closed with interrupted 3-0 Vicryl sutures.  Skin was closed with a running 4-0 Monocryl subcuticular suture.  Wound was washed and dried and benzoin and Steri-Strips were applied.  Sterile dressings were applied.  The patient was awakened from anesthesia and brought to the recovery room.  The patient tolerated the procedure well.   [name] Heckler, MD, FACS General & Endocrine Surgery [facility] Office: [phone]   TMG/MEDQ  D:  06/03/2013  T:  06/03/2013  Job:  [id number]  cc:   [name], MD [phone] [address] [place], [place] [address]

## 2013-06-03 NOTE — Anesthesia Postprocedure Evaluation (Signed)
  Anesthesia Post-op Note  Patient: [name] Wood  Procedure(s) Performed: Procedure(s) (LRB): TOTAL THYROIDECTOMY (N/A)  Patient Location: PACU  Anesthesia Type: General  Level of Consciousness: awake and alert   Airway and Oxygen Therapy: Patient Spontanous Breathing  Post-op Pain: mild  Post-op Assessment: Post-op Vital signs reviewed, Patient's Cardiovascular Status Stable, Respiratory Function Stable, Patent Airway and No signs of Nausea or vomiting  Last Vitals:  Filed Vitals:   06/03/13 1058  BP: 165/79  Pulse: 74  Temp: 36.4 C  Resp:     Post-op Vital Signs: stable   Complications: No apparent anesthesia complications

## 2013-06-04 LAB — BASIC METABOLIC PANEL
CO2: 27 mEq/L (ref 19–32)
Calcium: 9.5 mg/dL (ref 8.4–10.5)
Chloride: 95 mEq/L — ABNORMAL LOW (ref 96–112)
GFR calc non Af Amer: 22 mL/min — ABNORMAL LOW (ref >90)
Glucose, Bld: 137 mg/dL — ABNORMAL HIGH (ref 70–99)
Potassium: 3.7 mEq/L (ref 3.5–5.1)

## 2013-06-04 MED ORDER — LEVOTHYROXINE SODIUM 100 MCG PO TABS: 100.0000 ug | ORAL_TABLET | Freq: Every day | ORAL | Status: DC

## 2013-06-04 MED ORDER — HYDROCODONE-ACETAMINOPHEN 5-325 MG PO TABS: 1.0000 | ORAL_TABLET | ORAL | Status: DC | PRN

## 2013-06-04 MED ORDER — CALCIUM CARBONATE 1250 (500 CA) MG PO TABS: 2.0000 | ORAL_TABLET | Freq: Three times a day (TID) | ORAL | Status: DC

## 2013-06-04 NOTE — Discharge Summary (Signed)
Physician Discharge Summary  Patient ID: [name] Wood MRN: [id number] DOB/AGE: Mar 24, 1941 72 y.o.  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  Observed overnight.  No symptoms of hypocalcemia.  Ready for discharge  Consults: none  Significant Diagnostic Studies: none    Discharge Exam: Blood pressure 164/83, pulse 82, temperature 99.4 F (37.4 C), temperature source Oral, resp. rate 16, height 5\' 11"  (1.803 m), weight 195 lb (88.451 kg), SpO2 98.00%. Dressing dry.  Voice clear.  Minimal swelling  Disposition: 01-Home or Self Care  Discharge Orders   Future Appointments Provider Department Dept Phone   06/17/2013 2:00 PM Velora Heckler, MD [facility], [place] [phone]   Future Orders Complete By Expires     Diet - low sodium heart healthy  As directed     Discharge instructions  As directed     Comments:      Remove dressing tomorrow and shower.  Take calcium supplement and synthroid as directed    Increase activity slowly  As directed     No wound care  As directed         Medication List         amLODipine 10 MG tablet  Commonly known as:  NORVASC  Take 10 mg by mouth every evening.     calcium carbonate 1250 MG tablet  Commonly known as:  OS-CAL - dosed in mg of elemental calcium  Take 2 tablets (1,000 mg of elemental calcium total) by mouth 3 (three) times daily with meals.     calcium-vitamin D 500-200 MG-UNIT per tablet  Take 1 tablet by mouth 2 (two) times daily with a meal.     cholecalciferol 1000 UNITS tablet  Commonly known as:  VITAMIN D  Take 1,000 Units by mouth daily.     COLON CLEANSE Caps  Take 1 capsule by mouth daily.     fexofenadine 180 MG tablet  Commonly known as:  ALLEGRA  Take 180 mg by mouth daily.     HYDROcodone-acetaminophen 5-325 MG per tablet  Commonly known as:  NORCO/VICODIN  Take 1-2 tablets by mouth every 4 (four) hours as needed.     levothyroxine 100 MCG tablet  Commonly known as:  SYNTHROID, LEVOTHROID  Take 1 tablet (100 mcg total) by mouth daily before breakfast.     mometasone 50 MCG/ACT nasal spray  Commonly known as:  NASONEX  Place 2 sprays into the nose daily as needed.     multivitamin Tabs  Take 1 tablet by mouth daily.     polyvinyl alcohol 1.4 % ophthalmic solution  Commonly known as:  LIQUIFILM TEARS  Place 1 drop into both eyes daily as needed (for allergies).     simvastatin 20 MG tablet  Commonly known as:  ZOCOR  Take 20 mg by mouth at bedtime.     tamsulosin 0.4 MG Caps  Commonly known as:  FLOMAX  Take 0.4 mg by mouth daily as needed.     triamterene-hydrochlorothiazide 37.5-25 MG per capsule  Commonly known as:  DYAZIDE  Take 1 capsule by mouth every evening.           Follow-up Information   Follow  up with Velora Heckler, MD. Schedule an appointment as soon as possible for a visit in 3 weeks.   Contact information:   [address] [place] [address] [phone]       Signed: Valarie Merino 06/04/2013, 8:13 AM

## 2013-06-04 NOTE — Progress Notes (Signed)
Pt had recent episode of nausea and vomiting after eating breakfast. Stated " I think it was that banana I ate, but I'm feeling much better now." Abdomen remains distended with faint to hypoactive BS. Denies nausea since vomiting. Pt ambulating in hall presently. Dr. Daphine Deutscher aware of N/V episode while rounding on unit with no new orders received. MD instructed pt to just take diet slow and maintain liquid diet today. Pt and wife both verbalized understanding.

## 2013-06-04 NOTE — Progress Notes (Signed)
Assessment unchanged. Pt continues to deny N/V. Dressed and prepared to go home. Pt and wife reminded to just follow liquid diet today and progress diet slowly as Dr. Daphine Deutscher instructed with verbalized understanding. Pt verbalized understanding of all other dc instructions including when to follow up with MD. Script x 1 given as provided by MD. Pt able to tell nurse s/s of tetany through teach back. My Chart introduced to pt and wife with verbal understanding-"we'll let our daughter help [place] get signed up." Discharged via wc to front entrance to meet awaiting vehicle to carry home. Accompanied by NT, nurse and wife.

## 2013-06-06 ENCOUNTER — Encounter (HOSPITAL_COMMUNITY): Payer: Self-pay | Admitting: Surgery

## 2013-06-06 ENCOUNTER — Telehealth (INDEPENDENT_AMBULATORY_CARE_PROVIDER_SITE_OTHER): Payer: Self-pay

## 2013-06-06 NOTE — Telephone Encounter (Signed)
Received msg from Windell Moulding at front desk that pt needs rx. I called wife. She wanted to know when pt could get 90 day supply of synthroid. I advised her pt should wait until 6 wk thyroid labs are done in case med level needs to be changed. She states she understands. I advised her that pt will need to have calcium level drawn prior to 7-25 appt. Lab slip mailed to pt.

## 2013-06-08 ENCOUNTER — Telehealth (INDEPENDENT_AMBULATORY_CARE_PROVIDER_SITE_OTHER): Payer: Self-pay | Admitting: *Deleted

## 2013-06-08 NOTE — Telephone Encounter (Signed)
Wife called this morning to ask about path results.  Wife given results as negative margins and benign.  Wife also reminded of PO appt on 06/17/13.  Wife then states that patient has been running a fever on and off again.  Asked wife what the temperature was however she states she hasn't actually taken his temperature, he just feels hot.  Encouraged wife to use a thermometer to get an actual number and give [place] a call back this morning so we can discuss with Dr. Gerrit Friends while he is in office.  Wife states understanding and agreeable at this time.  She states she will go take his temperature and call back.

## 2013-06-10 ENCOUNTER — Telehealth (INDEPENDENT_AMBULATORY_CARE_PROVIDER_SITE_OTHER): Payer: Self-pay | Admitting: Surgery

## 2013-06-10 ENCOUNTER — Other Ambulatory Visit (INDEPENDENT_AMBULATORY_CARE_PROVIDER_SITE_OTHER): Payer: Self-pay

## 2013-06-10 DIAGNOSIS — D440 Neoplasm of uncertain behavior of thyroid gland: Secondary | ICD-10-CM

## 2013-06-10 DIAGNOSIS — C73 Malignant neoplasm of thyroid gland: Secondary | ICD-10-CM | POA: Insufficient documentation

## 2013-06-10 NOTE — Telephone Encounter (Signed)
Telephone call to patient with final pathology results.  Will arrange consultation with endocrinology (Dr. Carlus Pavlov) for radioactive iodine treatment.  Patient understands.  Will see at [facility] office at post op appointment.  tmg  Velora Heckler, MD, [facility] Office: [phone]

## 2013-06-10 NOTE — Telephone Encounter (Signed)
Order in epic and to ref coord to make appt for pt.

## 2013-06-13 ENCOUNTER — Ambulatory Visit (INDEPENDENT_AMBULATORY_CARE_PROVIDER_SITE_OTHER): Payer: Medicare Other | Admitting: Internal Medicine

## 2013-06-13 ENCOUNTER — Encounter: Payer: Self-pay | Admitting: Internal Medicine

## 2013-06-13 ENCOUNTER — Emergency Department (HOSPITAL_COMMUNITY): Payer: Medicare Other

## 2013-06-13 ENCOUNTER — Telehealth (INDEPENDENT_AMBULATORY_CARE_PROVIDER_SITE_OTHER): Payer: Self-pay | Admitting: General Surgery

## 2013-06-13 ENCOUNTER — Encounter (HOSPITAL_COMMUNITY): Payer: Self-pay | Admitting: Emergency Medicine

## 2013-06-13 ENCOUNTER — Telehealth (INDEPENDENT_AMBULATORY_CARE_PROVIDER_SITE_OTHER): Payer: Self-pay

## 2013-06-13 ENCOUNTER — Inpatient Hospital Stay (HOSPITAL_COMMUNITY)
Admission: EM | Admit: 2013-06-13 | Discharge: 2013-06-16 | DRG: 683 | Disposition: A | Discharge status: Home / Self Care | Payer: Medicare Other | Attending: Internal Medicine | Admitting: Internal Medicine

## 2013-06-13 VITALS — BP 180/80 | HR 76 | Temp 97.3°F | Wt 214.4 lb

## 2013-06-13 DIAGNOSIS — E785 Hyperlipidemia, unspecified: Secondary | ICD-10-CM | POA: Diagnosis present

## 2013-06-13 DIAGNOSIS — R0989 Other specified symptoms and signs involving the circulatory and respiratory systems: Secondary | ICD-10-CM

## 2013-06-13 DIAGNOSIS — N183 Chronic kidney disease, stage 3 unspecified: Secondary | ICD-10-CM | POA: Diagnosis present

## 2013-06-13 DIAGNOSIS — N320 Bladder-neck obstruction: Secondary | ICD-10-CM | POA: Diagnosis present

## 2013-06-13 DIAGNOSIS — N259 Disorder resulting from impaired renal tubular function, unspecified: Secondary | ICD-10-CM

## 2013-06-13 DIAGNOSIS — Z79899 Other long term (current) drug therapy: Secondary | ICD-10-CM

## 2013-06-13 DIAGNOSIS — D638 Anemia in other chronic diseases classified elsewhere: Secondary | ICD-10-CM | POA: Diagnosis present

## 2013-06-13 DIAGNOSIS — C73 Malignant neoplasm of thyroid gland: Secondary | ICD-10-CM | POA: Diagnosis present

## 2013-06-13 DIAGNOSIS — D62 Acute posthemorrhagic anemia: Secondary | ICD-10-CM | POA: Diagnosis present

## 2013-06-13 DIAGNOSIS — E871 Hypo-osmolality and hyponatremia: Secondary | ICD-10-CM | POA: Diagnosis present

## 2013-06-13 DIAGNOSIS — N4889 Other specified disorders of penis: Secondary | ICD-10-CM | POA: Diagnosis present

## 2013-06-13 DIAGNOSIS — M1990 Unspecified osteoarthritis, unspecified site: Secondary | ICD-10-CM | POA: Diagnosis present

## 2013-06-13 DIAGNOSIS — F528 Other sexual dysfunction not due to a substance or known physiological condition: Secondary | ICD-10-CM

## 2013-06-13 DIAGNOSIS — E010 Iodine-deficiency related diffuse (endemic) goiter: Secondary | ICD-10-CM

## 2013-06-13 DIAGNOSIS — I129 Hypertensive chronic kidney disease with stage 1 through stage 4 chronic kidney disease, or unspecified chronic kidney disease: Secondary | ICD-10-CM | POA: Diagnosis present

## 2013-06-13 DIAGNOSIS — R0609 Other forms of dyspnea: Secondary | ICD-10-CM

## 2013-06-13 DIAGNOSIS — Z0000 Encounter for general adult medical examination without abnormal findings: Secondary | ICD-10-CM

## 2013-06-13 DIAGNOSIS — N179 Acute kidney failure, unspecified: Principal | ICD-10-CM | POA: Diagnosis present

## 2013-06-13 DIAGNOSIS — R32 Unspecified urinary incontinence: Secondary | ICD-10-CM

## 2013-06-13 DIAGNOSIS — R0602 Shortness of breath: Secondary | ICD-10-CM

## 2013-06-13 DIAGNOSIS — N3289 Other specified disorders of bladder: Secondary | ICD-10-CM | POA: Diagnosis present

## 2013-06-13 DIAGNOSIS — N1330 Unspecified hydronephrosis: Secondary | ICD-10-CM | POA: Diagnosis present

## 2013-06-13 DIAGNOSIS — J309 Allergic rhinitis, unspecified: Secondary | ICD-10-CM

## 2013-06-13 DIAGNOSIS — C61 Malignant neoplasm of prostate: Secondary | ICD-10-CM | POA: Diagnosis present

## 2013-06-13 DIAGNOSIS — E872 Acidosis, unspecified: Secondary | ICD-10-CM | POA: Diagnosis present

## 2013-06-13 DIAGNOSIS — Z8546 Personal history of malignant neoplasm of prostate: Secondary | ICD-10-CM

## 2013-06-13 DIAGNOSIS — E875 Hyperkalemia: Secondary | ICD-10-CM | POA: Diagnosis present

## 2013-06-13 DIAGNOSIS — N139 Obstructive and reflux uropathy, unspecified: Secondary | ICD-10-CM | POA: Diagnosis present

## 2013-06-13 DIAGNOSIS — R339 Retention of urine, unspecified: Secondary | ICD-10-CM

## 2013-06-13 DIAGNOSIS — I10 Essential (primary) hypertension: Secondary | ICD-10-CM

## 2013-06-13 DIAGNOSIS — E039 Hypothyroidism, unspecified: Secondary | ICD-10-CM | POA: Diagnosis present

## 2013-06-13 LAB — PRO B NATRIURETIC PEPTIDE: Pro B Natriuretic peptide (BNP): 267.2 pg/mL — ABNORMAL HIGH (ref 0–125)

## 2013-06-13 LAB — BASIC METABOLIC PANEL
BUN: 110 mg/dL — ABNORMAL HIGH (ref 6–23)
CO2: 14 mEq/L — ABNORMAL LOW (ref 19–32)
CO2: 15 mEq/L — ABNORMAL LOW (ref 19–32)
Chloride: 78 mEq/L — ABNORMAL LOW (ref 96–112)
Chloride: 78 mEq/L — ABNORMAL LOW (ref 96–112)
Creatinine, Ser: 16.43 mg/dL — ABNORMAL HIGH (ref 0.50–1.35)
GFR calc Af Amer: 3 mL/min — ABNORMAL LOW (ref >90)
Glucose, Bld: 94 mg/dL (ref 70–99)
Potassium: 6.4 mEq/L (ref 3.5–5.1)
Potassium: 4.5 mEq/L (ref 3.5–5.1)

## 2013-06-13 LAB — URINALYSIS, ROUTINE W REFLEX MICROSCOPIC
Glucose, UA: NEGATIVE mg/dL
Ketones, ur: NEGATIVE mg/dL
Leukocytes, UA: NEGATIVE
Protein, ur: NEGATIVE mg/dL

## 2013-06-13 LAB — CK: Total CK: 778 U/L — ABNORMAL HIGH (ref 7–232)

## 2013-06-13 LAB — CBC WITH DIFFERENTIAL/PLATELET
Eosinophils Relative: 1 % (ref 0–5)
MCH: 31.1 pg (ref 26.0–34.0)
Monocytes Absolute: 0.4 10*3/uL (ref 0.1–1.0)
Neutrophils Relative %: 85 % — ABNORMAL HIGH (ref 43–77)
Platelets: 459 10*3/uL — ABNORMAL HIGH (ref 150–400)
RBC: 3.51 MIL/uL — ABNORMAL LOW (ref 4.22–5.81)
WBC: 13.7 10*3/uL — ABNORMAL HIGH (ref 4.0–10.5)

## 2013-06-13 LAB — POCT I-STAT, CHEM 8
Chloride: 89 mEq/L — ABNORMAL LOW (ref 96–112)
HCT: 30 % — ABNORMAL LOW (ref 39.0–52.0)
Potassium: 4.7 mEq/L (ref 3.5–5.1)

## 2013-06-13 LAB — POCT I-STAT TROPONIN I: Troponin i, poc: 0.02 ng/mL (ref 0.00–0.08)

## 2013-06-13 MED ORDER — SIMVASTATIN 20 MG PO TABS
20.0000 mg | ORAL_TABLET | Freq: Every day | ORAL | Status: DC
  Administered 2013-06-13 – 2013-06-15 (×3): 20 mg via ORAL
  Filled 2013-06-13 (×4): qty 1

## 2013-06-13 MED ORDER — TAMSULOSIN HCL 0.4 MG PO CAPS
0.4000 mg | ORAL_CAPSULE | Freq: Every day | ORAL | Status: DC
  Administered 2013-06-14 – 2013-06-16 (×4): 0.4 mg via ORAL
  Filled 2013-06-13 (×4): qty 1

## 2013-06-13 MED ORDER — HEPARIN SODIUM (PORCINE) 5000 UNIT/ML IJ SOLN
5000.0000 [IU] | Freq: Three times a day (TID) | INTRAMUSCULAR | Status: DC
  Administered 2013-06-13 – 2013-06-16 (×8): 5000 [IU] via SUBCUTANEOUS
  Filled 2013-06-13 (×11): qty 1

## 2013-06-13 MED ORDER — LORATADINE 10 MG PO TABS
10.0000 mg | ORAL_TABLET | Freq: Every day | ORAL | Status: DC
  Administered 2013-06-13 – 2013-06-16 (×4): 10 mg via ORAL
  Filled 2013-06-13 (×4): qty 1

## 2013-06-13 MED ORDER — TECHNETIUM TC 99M DIETHYLENETRIAME-PENTAACETIC ACID: 40.0000 | Freq: Once | INTRAVENOUS | Status: AC | PRN

## 2013-06-13 MED ORDER — TECHNETIUM TO 99M ALBUMIN AGGREGATED
6.0000 | Freq: Once | INTRAVENOUS | Status: AC | PRN
  Administered 2013-06-13: 4.8 via INTRAVENOUS

## 2013-06-13 MED ORDER — SODIUM CHLORIDE 0.45 % IV SOLN
INTRAVENOUS | Status: DC
  Administered 2013-06-13 – 2013-06-15 (×4): via INTRAVENOUS

## 2013-06-13 MED ORDER — LEVOTHYROXINE SODIUM 100 MCG PO TABS
100.0000 ug | ORAL_TABLET | Freq: Every day | ORAL | Status: DC
  Administered 2013-06-14 – 2013-06-16 (×3): 100 ug via ORAL
  Filled 2013-06-13 (×4): qty 1

## 2013-06-13 MED ORDER — TECHNETIUM TO 99M ALBUMIN AGGREGATED: 6.0000 | Freq: Once | INTRAVENOUS | Status: AC | PRN

## 2013-06-13 MED ORDER — ADULT MULTIVITAMIN W/MINERALS CH
1.0000 | ORAL_TABLET | Freq: Every day | ORAL | Status: DC
  Administered 2013-06-14 – 2013-06-16 (×4): 1 via ORAL
  Filled 2013-06-13 (×5): qty 1

## 2013-06-13 MED ORDER — FLUTICASONE PROPIONATE 50 MCG/ACT NA SUSP
2.0000 | Freq: Every day | NASAL | Status: DC
  Administered 2013-06-14 – 2013-06-16 (×3): 2 via NASAL
  Filled 2013-06-13: qty 16

## 2013-06-13 MED ORDER — AMLODIPINE BESYLATE 10 MG PO TABS
10.0000 mg | ORAL_TABLET | Freq: Every evening | ORAL | Status: DC
  Administered 2013-06-13 – 2013-06-15 (×3): 10 mg via ORAL
  Filled 2013-06-13 (×4): qty 1

## 2013-06-13 MED ORDER — HYDROCODONE-ACETAMINOPHEN 5-325 MG PO TABS: 1.0000 | ORAL_TABLET | ORAL | Status: DC | PRN

## 2013-06-13 NOTE — ED Provider Notes (Signed)
Pt s/p thyroidectomy 3 days ago here for SOB, DOE here to r/o PE.  Will check renal function and obtain CTA.  Likely admits.  If neg, have pt ambulate and check O2.    5:49 PM BMP with wildly abnormal value, likely 2/2 hemolysis.  Will check Istat8 instead . However pt's prior renal function is abnormal in the past, which means pt will need VQ scan instead of CTA to avoid contrast media.  VQ scan ordered.    7:00 PM i repeat Istat8, Na+ is 118, K+ 4.7, and renal function values are alarming with BUN 119, Cr 17.2 showing evidence of acute renal failure.  CXR shows no evidence of pulmonary edema or or vascular congestion to suggest flash pulmonary edema.  Pt has low ionized calcium which may suggest pathology to parathyroid.  Care discussed with attending.    8:13 PM I had a chance to talk to pt's family member.  Pt has prior hx of prostate cancer, also hx of small cell lung canger.  Had recent thyrodectomy.  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 Obstructive uropathy.  Since pt c/o scrotal swelling, cannot r/o obstructive pathology causing renal failure.  Will obtain bladder scan and also renal US to r/o obstructive pathology  9:00 PM I have consulted [facility], Dr. Julian Reil who agrees to admit pt.  I also consulted with nephrologist, Dr. Malachi Bonds who agrees to see pt in ER.  Pt however able to void without difficulty.    9:42 PM Bladder scan shows 1.2L of retain urine after post void 30 mins ago. Has distented bladder on exam, and scrotal edema.  Foley catheter apply.  Pt to be admitted.    BP 160/57  Pulse 78  Resp 18  SpO2 95%  I have reviewed nursing notes and vital signs. I personally reviewed the imaging tests through PACS system  I reviewed available ER/hospitalization records thought the EMR  Results for orders placed during the hospital encounter of 06/13/13  CBC WITH DIFFERENTIAL      Result Value Range   WBC 13.7 (*) 4.0 - 10.5 K/uL   RBC 3.51 (*) 4.22 - 5.81 MIL/uL   Hemoglobin  10.9 (*) 13.0 - 17.0 g/dL   HCT 09.8 (*) 11.9 - 14.7 %   MCV 82.3  78.0 - 100.0 fL   MCH 31.1  26.0 - 34.0 pg   MCHC 37.7 (*) 30.0 - 36.0 g/dL   RDW 82.9  56.2 - 13.0 %   Platelets 459 (*) 150 - 400 K/uL   Neutrophils Relative % 85 (*) 43 - 77 %   Lymphocytes Relative 11 (*) 12 - 46 %   Monocytes Relative 3  3 - 12 %   Eosinophils Relative 1  0 - 5 %   Basophils Relative 0  0 - 1 %   Neutro Abs 11.7 (*) 1.7 - 7.7 K/uL   Lymphs Abs 1.5  0.7 - 4.0 K/uL   Monocytes Absolute 0.4  0.1 - 1.0 K/uL   Eosinophils Absolute 0.1  0.0 - 0.7 K/uL   Basophils Absolute 0.0  0.0 - 0.1 K/uL   RBC Morphology POLYCHROMASIA PRESENT    BASIC METABOLIC PANEL      Result Value Range   Sodium 116 (*) 135 - 145 mEq/L   Potassium 6.4 (*) 3.5 - 5.1 mEq/L   Chloride 78 (*) 96 - 112 mEq/L   CO2 14 (*) 19 - 32 mEq/L   Glucose, Bld 123 (*) 70 - 99 mg/dL  BUN 110 (*) 6 - 23 mg/dL   Creatinine, Ser 45.40 (*) 0.50 - 1.35 mg/dL   Calcium 8.5  8.4 - 98.1 mg/dL   GFR calc non Af Amer 2 (*) >90 mL/min   GFR calc Af Amer 3 (*) >90 mL/min  PRO B NATRIURETIC PEPTIDE      Result Value Range   Pro B Natriuretic peptide (BNP) 267.2 (*) 0 - 125 pg/mL  POCT I-STAT TROPONIN I      Result Value Range   Troponin i, poc 0.02  0.00 - 0.08 ng/mL   Comment 3           POCT I-STAT, CHEM 8      Result Value Range   Sodium 118 (*) 135 - 145 mEq/L   Potassium 4.7  3.5 - 5.1 mEq/L   Chloride 89 (*) 96 - 112 mEq/L   BUN 119 (*) 6 - 23 mg/dL   Creatinine, Ser 19.14 (*) 0.50 - 1.35 mg/dL   Glucose, Bld 98  70 - 99 mg/dL   Calcium, Ion 7.82 (*) 1.13 - 1.30 mmol/L   TCO2 16  0 - 100 mmol/L   Hemoglobin 10.2 (*) 13.0 - 17.0 g/dL   HCT 95.6 (*) 21.3 - 08.6 %   Comment NOTIFIED PHYSICIAN     Dg Chest 2 View  06/13/2013   *RADIOLOGY REPORT*  Clinical Data: 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  Calcification of a mildly elongated thoracic aorta. Mediastinal contours and pulmonary vascularity normal. Peribronchial thickening with atelectasis at right base. No definite infiltrate, pleural effusion or pneumothorax. Surgical clips from recent thyroidectomy noted. No acute osseous findings.  IMPRESSION: Bronchitic changes with right basilar atelectasis.   Original Report Authenticated By: Ulyses Southward, M.D.   Dg Chest 2 View  05/31/2013   *RADIOLOGY REPORT*  Clinical Data: Preoperative evaluation for total thyroidectomy for multinodular goiter.  Nonsmoker.  Hypertension.  No current chest complaints  CHEST - 2 VIEW  Comparison: 06/24/2011  Findings: Lower lung volumes are present than on the prior exam with some resulting crowding of bronchovascular markings.  Taking this into consideration, heart and mediastinal contours are stable and within normal limits.  The lung fields appear clear with no signs of focal infiltrate or congestive failure.  No pleural fluid or significant peribronchial cuffing is seen.  Bony structures demonstrate mild degenerative change of the mid thoracic spine and are otherwise intact.  IMPRESSION: Lower lung volumes with an otherwise stable cardiopulmonary appearance with no new focal or acute abnormality suggested.   Original Report Authenticated By: Rhodia Albright, M.D.   Nm Pulmonary Perf And Vent  06/13/2013   *RADIOLOGY REPORT*  Clinical Data: Shortness of breath  NM PULMONARY VENTILATION AND PERFUSION SCAN  Views:  Anterior, posterior, right lateral, left lateral, RPO, LPO, RAO, LAO - ventilation and perfusion  Radiopharmaceutical: Technetium 95m DTPA - ventilation; Technetium 46m macroaggregated albumin - perfusion  Dose:  40.0 mCi - ventilation; 4.8 mCi - perfusion  Route of administration:  Inhalation - ventilation; intravenous - perfusion  Comparison: Chest radiograph June 13, 2013  Findings:  The ventilation study shows homogeneous and symmetric uptake of radiotracer  bilaterally.  The perfusion study shows homogeneous and symmetric uptake of radiotracer bilaterally.  There is no appreciable ventilation / perfusion mismatch.  IMPRESSION: Normal studies.  Very low probability of pulmonary embolus.   Original Report Authenticated  By: Bretta Bang, M.D.      Fayrene Helper, PA-C 06/13/13 2145  Fayrene Helper, PA-C 06/13/13 2147

## 2013-06-13 NOTE — Telephone Encounter (Signed)
The pt's wife called and reports that he is having some difficulties since surgery for his thyroid on 7/11.  He can't sleep at night.  He has been having diarrhea 3 x a day.  It is watery.  He has no fever.  He has no trouble swallowing and is eating fine.  He has no chest pain but is having shortness of breath when he gets up and walks around the house.  She states he can hardly move around.  He is also incontinent of urine.  I did not think bowel and bladder issues were from surgery.  I told her I will send a message to Dr Gerrit Friends and his nurse.

## 2013-06-13 NOTE — ED Notes (Signed)
Pt returned from radiology.

## 2013-06-13 NOTE — H&P (Signed)
[facility] History and Physical  [name] Wood ZOX:[id number] DOB: 11/09/1941 DOA: 06/13/2013  Referring physician: ED PCP: [name], MD  Chief Complaint: SOB  HPI: [name] Wood is a 72 y.o. male who presents with generalized weakness and SOB.  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  He states he has passed some urine since then but is unclear regarding how much.  He has been bedbound for most of the days following the surgery.  Admits to urinary frequency "every 30 mins" because of his symptoms he presented to the ER for evaluation.  In the ED he was found to have a creatinine of >17 a BUN in the 110s, a sodium of 118, and urinary obstruction with over 1.2L in his bladder with B hydronephrosis.  Review of Systems: 12 systems reviewed and otherwise negative.  Past Medical History  Diagnosis Date  . Allergy     rhinitis  . Hyperlipidemia   . Hypertension   . Osteoarthritis   . Erectile dysfunction   . Overweight(278.02)   . Renal insufficiency     baseline cr 1.5 11-06; cr 1.3 on 5-02  . Prostate cancer 2-11    prostate  . Prostate ca 2013    seeding implant  . 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 Tonsillectomy  1967  . Radioactive seed implant  ~2012  . Hernia repair  2009    right  . Thyroidectomy N/A 06/03/2013    Procedure: TOTAL THYROIDECTOMY;  Surgeon: Velora Heckler, MD;  Location: [facility];  Service: General;  Laterality: N/A;   Social History:  reports that he has never smoked. He has never used smokeless tobacco. He reports that  drinks alcohol. He reports that he does not use illicit drugs.   No Known Allergies  Family History  Problem Relation Age of Onset  . Alzheimer's disease Mother   . Diabetes Father   . Kidney disease Father   . Cancer Sister     ? type  . Colon cancer Neg Hx   . Prostate cancer Neg Hx   . CAD Neg Hx     )  Prior to Admission medications   Medication Sig Start Date End Date Taking? Authorizing Provider  amLODipine (NORVASC) 10 MG tablet Take 10 mg by mouth every evening.   Yes Historical Provider, MD  Calcium Carbonate-Vitamin D (CALCIUM-VITAMIN D) 500-200 MG-UNIT per tablet Take 1 tablet by mouth 2 (two) times daily with a meal.   Yes Historical Provider, MD  cholecalciferol (VITAMIN D) 1000 UNITS tablet Take 1,000 Units by mouth daily.   Yes Historical Provider, MD  fexofenadine (ALLEGRA) 180 MG tablet Take 180 mg by mouth daily as needed (allergies).    Yes Historical Provider, MD  HYDROcodone-acetaminophen (NORCO/VICODIN) 5-325 MG per tablet Take 1-2 tablets by mouth every 4 (four) hours as needed for pain.   Yes Historical Provider, MD  levothyroxine (SYNTHROID, LEVOTHROID) 100 MCG tablet Take 1 tablet (100 mcg total) by mouth daily before breakfast. 06/04/13  Yes Valarie Merino, MD  mometasone (NASONEX) 50 MCG/ACT nasal spray Place 2 sprays into the nose daily as needed (rhinitis).  04/09/11  Yes Wanda Plump, MD  multivitamin (ONE-A-DAY MEN'S) TABS Take 1 tablet by mouth daily.     Yes Historical Provider, MD  polyvinyl alcohol (LIQUIFILM TEARS) 1.4 % ophthalmic solution Place 1 drop into both eyes  daily as needed (for allergies).   Yes Historical Provider, MD  simvastatin (ZOCOR) 20 MG tablet Take 20 mg by mouth at bedtime.   Yes Historical Provider, MD  tamsulosin (FLOMAX) 0.4 MG CAPS Take 0.4 mg by mouth daily.    Yes Historical Provider, MD  triamterene-hydrochlorothiazide (DYAZIDE) 37.5-25 MG per capsule Take 1 capsule by mouth every evening.   Yes Historical Provider, MD   Physical Exam: Filed Vitals:   06/13/13 1731 06/13/13 2032  BP:  160/57  Pulse:  78  Resp: 18 18  SpO2:  95%    General:  NAD, resting comfortably in bed Eyes: PEERLA EOMI ENT: mucous membranes moist Neck: supple w/o JVD Cardiovascular: RRR w/o MRG Respiratory: CTA B Abdomen: soft, nt, bladder severely  destended, bs+ Skin: no rash nor lesion Musculoskeletal: MAE, full ROM all 4 extremities Psychiatric: normal tone and affect Neurologic: AAOx3, grossly non-focal  Labs on Admission:  Basic Metabolic Panel:  Recent Labs Lab 06/13/13 1609 06/13/13 1832  NA 116* 118*  K 6.4* 4.7  CL 78* 89*  CO2 14*  --   GLUCOSE 123* 98  BUN 110* 119*  CREATININE 17.03* 17.20*  CALCIUM 8.5  --    Liver Function Tests: No results found for this basename: AST, ALT, ALKPHOS, BILITOT, PROT, ALBUMIN,  in the last 168 hours No results found for this basename: LIPASE, AMYLASE,  in the last 168 hours No results found for this basename: AMMONIA,  in the last 168 hours CBC:  Recent Labs Lab 06/13/13 1340 06/13/13 1832  WBC 13.7*  --   NEUTROABS 11.7*  --   HGB 10.9* 10.2*  HCT 28.9* 30.0*  MCV 82.3  --   PLT 459*  --    Cardiac Enzymes: No results found for this basename: CKTOTAL, CKMB, CKMBINDEX, TROPONINI,  in the last 168 hours  BNP (last 3 results)  Recent Labs  06/13/13 1609  PROBNP 267.2*   CBG: No results found for this basename: GLUCAP,  in the last 168 hours  Radiological Exams on Admission: Dg Chest 2 View  06/13/2013   *RADIOLOGY REPORT*  Clinical Data: 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 Calcification of a mildly elongated thoracic aorta. Mediastinal contours and pulmonary vascularity normal. Peribronchial thickening with atelectasis at right base. No definite infiltrate, pleural effusion or pneumothorax. Surgical clips from recent thyroidectomy noted. No acute osseous findings.  IMPRESSION: Bronchitic changes with right basilar atelectasis.   Original Report Authenticated By: Ulyses Southward, M.D.   US Renal  06/13/2013   *RADIOLOGY REPORT*  Clinical Data: Acute renal failure  RENAL/URINARY TRACT ULTRASOUND COMPLETE  Comparison:  Abdominal  ultrasound 02/22/2013  Findings:  Right Kidney:  Measures 13.0 cm in sagittal length.  Echogenicity of the renal parenchyma appears increased compared to the adjacent liver.  There is mild prominence of the right renal collecting system and calyces consistent with mild hydronephrosis.  Several simple renal cysts are again seen.  The largest cyst is exophytic from the mid pole measuring 4.0 x 4.0 x 4.0 cm.  The second largest cyst is exophytic from the upper pole measuring 2.6 by 2.8 x 2.3 cm.  Left Kidney:  The left kidney measures 11.0 cm in sagittal length. Renal cortex appears slightly echogenic.  There is mild fullness of the left renal collecting system, suggesting mild hydronephrosis. Three small cysts are seen, largest measuring 1.7 x  1.6 x 1.4 cm, extending exophytically from the lower pole.  Bladder:  The urinary bladder is very distended at the time of the imaging.  The sonographer reports that the patient had voided just prior to the examination.  The urinary bladder wall thickness appears normal.  Bladder volume is calculated to be 1239 cm cubed.  A small amount of ascites was noted adjacent to the liver.  IMPRESSION:  1.  Mild  bilateral hydronephrosis and very distended urinary bladder. The fullness of both renal collecting systems could be secondary to the patient's very distended urinary bladder.  The urinary bladder is distended, despite the patient having reportedly voided prior to performance of the ultrasound. Question if the patient could have bladder outlet obstruction. 2.  Slight increased echogenicity of the renal cortices could be due to chronic medical renal disease. 3.  Very small amount of ascites noted adjacent to the liver. 4.  Bilateral simple renal cysts.   Original Report Authenticated By: Britta Mccreedy, M.D.   Nm Pulmonary Perf And Vent  06/13/2013   *RADIOLOGY REPORT*  Clinical Data: Shortness of breath  NM PULMONARY VENTILATION AND PERFUSION SCAN  Views:  Anterior, posterior,  right lateral, left lateral, RPO, LPO, RAO, LAO - ventilation and perfusion  Radiopharmaceutical: Technetium 64m DTPA - ventilation; Technetium 52m macroaggregated albumin - perfusion  Dose:  40.0 mCi - ventilation; 4.8 mCi - perfusion  Route of administration:  Inhalation - ventilation; intravenous - perfusion  Comparison: Chest radiograph June 13, 2013  Findings:  The ventilation study shows homogeneous and symmetric uptake of radiotracer bilaterally.  The perfusion study shows homogeneous and symmetric uptake of radiotracer bilaterally.  There is no appreciable ventilation / perfusion mismatch.  IMPRESSION: Normal studies.  Very low probability of pulmonary embolus.   Original Report Authenticated By: Bretta Bang, M.D.    EKG: Independently reviewed.  Assessment/Plan Principal Problem:   Acute kidney failure Active Problems:   Hyponatremia   Obstructive uropathy   1. 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  Q4H BMPs to monitor sodium as we dont want this correcting too quickly.  Holding diuretics.  Nephrology has seen patient in consult and their note is in the chart. 2. Hyponatremia - likely due to #1 and HCTZ, holding HCTZ and ordering Q4H BMPs, nephrology has recommended low dose 1/2 NS at this time and not to give NS or other hypertonic fluid, as this hyponatremia is expected to resolve rather quickly with return of normal kidney function expected above (dont want sodium to raise too fast as this can be dangerous).    Code Status: Full Code (must indicate code status--if unknown or must be presumed, indicate so) Family Communication: Spoke with family at bedside (indicate person spoken with, if applicable, with  phone number if by telephone) Disposition Plan: Admit to inpatient (indicate anticipated LOS)  Time spent: 70 min  GARDNER, JARED M. [facility] Pager [phone]  If 7PM-7AM, please contact night-coverage [email or web address] Password [facility] 06/13/2013, 10:20 PM

## 2013-06-13 NOTE — ED Provider Notes (Signed)
History    CSN: [id number] Arrival date & time 06/13/13  1253  First MD Initiated Contact with Patient 06/13/13 1258     Chief Complaint  Patient presents with  . Groin Swelling  . Leg Swelling    bilat    (Consider location/radiation/quality/duration/timing/severity/associated sxs/prior Treatment) HPI Comments: Patient is a 72 year old male with a past medical history of thyroid cancer who is 3 days s/p thyroidectomy who presents with SOB for the past 3 days. Symptoms started gradually and progressively worsened since the onset. Patient reports any exertion makes the pain worse. Patient also complains of associated bilateral leg swelling and penile swelling. Patient has not tried anything for symptoms. Patient saw his PCP today who sent him to the ED to rule out PE. Patient has no previous history of PE.   Past Medical History  Diagnosis Date  . Allergy     rhinitis  . Hyperlipidemia   . Hypertension   . Osteoarthritis   . Erectile dysfunction   . Overweight(278.02)   . Renal insufficiency     baseline cr 1.5 11-06; cr 1.3 on 5-02  . Prostate cancer 2-11    prostate  . Prostate ca 2013    seeding implant  . 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 Tonsillectomy  1967  . Radioactive seed implant  ~2012  . Hernia repair  2009    right  . Thyroidectomy N/A 06/03/2013    Procedure: TOTAL THYROIDECTOMY;  Surgeon: Velora Heckler, MD;  Location: [facility];  Service: General;  Laterality: N/A;   Family History  Problem Relation Age of Onset  . Alzheimer's disease Mother   . Diabetes Father   . Kidney disease Father   . Cancer Sister     ? type  . Colon cancer Neg Hx   . Prostate cancer Neg Hx   . CAD Neg Hx    History  Substance Use Topics  . Smoking status: Never Smoker   . Smokeless tobacco: Never Used  . Alcohol Use: Yes     Comment: very rarely     Review of Systems  Respiratory: Positive for shortness of  breath.   Cardiovascular: Positive for leg swelling.  All other systems reviewed and are negative.    Allergies  Review of patient's allergies indicates no known allergies.  Home Medications   Current Outpatient Rx  Name  Route  Sig  Dispense  Refill  . amLODipine (NORVASC) 10 MG tablet   Oral   Take 10 mg by mouth every evening.         . calcium carbonate (OS-CAL - DOSED IN MG OF ELEMENTAL CALCIUM) 1250 MG tablet   Oral   Take 2 tablets (1,000 mg of elemental calcium total) by mouth 3 (three) times daily with meals.   120 tablet   1   . Calcium Carbonate-Vitamin D (CALCIUM-VITAMIN D) 500-200 MG-UNIT per tablet   Oral   Take 1 tablet by mouth 2 (two) times daily with a meal.         . cholecalciferol (VITAMIN D) 1000 UNITS tablet   Oral   Take 1,000 Units by mouth daily.         . fexofenadine (ALLEGRA) 180 MG tablet   Oral   Take 180 mg by mouth daily.         Marland Kitchen HYDROcodone-acetaminophen (NORCO/VICODIN) 5-325 MG per tablet   Oral  Take 1-2 tablets by mouth every 4 (four) hours as needed.   30 tablet   0   . levothyroxine (SYNTHROID, LEVOTHROID) 100 MCG tablet   Oral   Take 1 tablet (100 mcg total) by mouth daily before breakfast.   90 tablet   1   . Misc Natural Products (COLON CLEANSE) CAPS   Oral   Take 1 capsule by mouth daily.         . mometasone (NASONEX) 50 MCG/ACT nasal spray   Nasal   Place 2 sprays into the nose daily as needed.           . multivitamin (ONE-A-DAY MEN'S) TABS   Oral   Take 1 tablet by mouth daily.           . polyvinyl alcohol (LIQUIFILM TEARS) 1.4 % ophthalmic solution   Both Eyes   Place 1 drop into both eyes daily as needed (for allergies).         . simvastatin (ZOCOR) 20 MG tablet   Oral   Take 20 mg by mouth at bedtime.         . tamsulosin (FLOMAX) 0.4 MG CAPS   Oral   Take 0.4 mg by mouth daily as needed.         . triamterene-hydrochlorothiazide (DYAZIDE) 37.5-25 MG per capsule   Oral    Take 1 capsule by mouth every evening.          There were no vitals taken for this visit. Physical Exam  Nursing note and vitals reviewed. Constitutional: He is oriented to person, place, and time. He appears well-developed and well-nourished. No distress.  HENT:  Head: Normocephalic and atraumatic.  Eyes: Conjunctivae and EOM are normal.  Neck: Normal range of motion. Neck supple.  4 cm horizontal healing laceration of anterior neck with steri strips intact. Nontender to palpation or discharge from the surgical site.   Cardiovascular: Normal rate and regular rhythm.  Exam reveals no gallop and no friction rub.   No murmur heard. Pulmonary/Chest: Effort normal and breath sounds normal. He has no wheezes. He has no rales. He exhibits no tenderness.  Abdominal: Soft. He exhibits no distension. There is no tenderness. There is no rebound and no guarding.  Genitourinary:  Circumcised penis with generalized edema.   Musculoskeletal: Normal range of motion.  Bilateral lower extremity edema. No calf tenderness to palpation.   Lymphadenopathy:    He has no cervical adenopathy.  Neurological: He is alert and oriented to person, place, and time. Coordination normal.  Speech is goal-oriented. Moves limbs without ataxia.   Skin: Skin is warm and dry.  Psychiatric: He has a normal mood and affect. His behavior is normal.    ED Course  Procedures (including critical care time)   Date: 06/13/2013  Rate: 71   Rhythm: normal sinus rhythm  QRS Axis: left  Intervals: QT prolonged  ST/T Wave abnormalities: normal  Conduction Disutrbances:nonspecific intraventricular conduction delay  Narrative Interpretation: NSR with nonspecific intraventricular conduction delay unchanged from previous  Old EKG Reviewed: unchanged   Labs Reviewed  CBC WITH DIFFERENTIAL - Abnormal; Notable for the following:    WBC 13.7 (*)    RBC 3.51 (*)    Hemoglobin 10.9 (*)    HCT 28.9 (*)    MCHC 37.7 (*)     Platelets 459 (*)    Neutrophils Relative % 85 (*)    Lymphocytes Relative 11 (*)    Neutro Abs 11.7 (*)  All other components within normal limits  BASIC METABOLIC PANEL  PRO B NATRIURETIC PEPTIDE  POCT I-STAT TROPONIN I   Dg Chest 2 View  06/13/2013   *RADIOLOGY REPORT*  Clinical Data: 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 Calcification of a mildly elongated thoracic aorta. Mediastinal contours and pulmonary vascularity normal. Peribronchial thickening with atelectasis at right base. No definite infiltrate, pleural effusion or pneumothorax. Surgical clips from recent thyroidectomy noted. No acute osseous findings.  IMPRESSION: Bronchitic changes with right basilar atelectasis.   Original Report Authenticated By: Ulyses Southward, M.D.   No diagnosis found.  MDM  1:18 PM Labs and and chest CT pending. Vitals stable and patient afebrile.   3:25 PM Patient signed out to Fayrene Helper, PA-C pending CT angio to rule out PE.     Emilia Beck, PA-C 06/13/13 1525

## 2013-06-13 NOTE — ED Notes (Signed)
Bed assigned@2209 

## 2013-06-13 NOTE — ED Notes (Signed)
Attempted IV access two times, both unsuccessful.

## 2013-06-13 NOTE — ED Notes (Signed)
Pt has bilat leg swelling and groin swelling that per his wife started last night.  Pt went to PCP today and was sent here for further eval for possible DVT.Pt c/o pain in bilat thighs.

## 2013-06-13 NOTE — Progress Notes (Signed)
CRITICAL VALUE ALERT Critical value received:  Sodium 117  Date of notification:  06/13/13  Time of notification:  2033  Critical value read back: yes  Nurse who received alert:  Dedra Skeens, RN  MD notified (1st page):  Donnamarie Poag, midlevel  Time of first page:  2325  MD notified (2nd page): Donnamarie Poag, midlevel  Time of second page: 0110  Responding MD:  none  Time MD responded:  none

## 2013-06-13 NOTE — ED Provider Notes (Signed)
Medical screening examination/treatment/procedure(s) were conducted as a shared visit with non-physician practitioner(s) and myself.  I personally evaluated the patient during the encounter  Patient here describing groin swelling as well as lower extremity edema with some associated dyspnea.  Patient with moderate scrotal and pedal edema. No crepitus noted. No penile drainage. Lungs are diminished breath sounds. Cervical surgical site with intact wound without drainage. No stridor noted. Patient has no trouble swallowing. No evidence of hematoma.  Patient to be ruled out for CHF versus PE.    Toy Baker, MD 06/13/13 [phone]

## 2013-06-13 NOTE — ED Notes (Signed)
Pt still at procedure.

## 2013-06-13 NOTE — ED Notes (Signed)
Per bladder scanner volume is 0

## 2013-06-13 NOTE — ED Notes (Signed)
Greta Doom PA given i-stat results.

## 2013-06-13 NOTE — Progress Notes (Signed)
  Subjective:    Patient ID: [name] Wood, male    DOB: 1940/12/11, 72 y.o.   MRN: [id number]  HPI Acute visit Status post thyroidectomy 06/03/2013, not feeling well since then: Feeling very weak, has been in bed most of the days since the surgery. Initially had diarrhea,now that is better. When asked admits to beshortness or breath, only able to walk a few steps before he gets short of breath. Denies chest pain. Since yesterday, his penis has swollen, has developed urinary frequency "every 30 minutes".  Past Medical History  Diagnosis Date  . Allergy     rhinitis  . Hyperlipidemia   . Hypertension   . Osteoarthritis   . Erectile dysfunction   . Overweight(278.02)   . Renal insufficiency     baseline cr 1.5 11-06; cr 1.3 on 5-02  . Prostate cancer 2-11    prostate  . Prostate ca 2013    seeding implant  . 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 Tonsillectomy  1967  . Radioactive seed implant  ~2012  . Hernia repair  2009    right  . Thyroidectomy N/A 06/03/2013    Procedure: TOTAL THYROIDECTOMY;  Surgeon: Velora Heckler, MD;  Location: [facility];  Service: General;  Laterality: N/A;   Review of Systems Initially had fevers but has been afebrile for several days. No chills. Denies headaches. No upper or lower extremity paresthesias, no bowel incontinence. Denies any difficulty swallowing or pain when he swallows. Some lower extremity edema. Appetite decrease. Drinking fluids ok. No nausea, vomiting, constipation    Objective:   Physical Exam BP 180/80  Pulse 76  Temp(Src) 97.3 F (36.3 C) (Oral)  Wt 214 lb 6.4 oz (97.251 kg)  BMI 29.92 kg/m2  SpO2 98%  General -- alert, well-developed, weak appearing but in no distress. Not pale.   Neck --no redness or d/c at the surgical site. No difficulty swallowing, voice normal  Lungs -- normal respiratory effort, no intercostal retractions, no accessory muscle use, and  normal breath sounds.   Heart-- normal rate, regular rhythm, no murmur, and no gallop.   Abdomen--soft, non-tender, slt  Distention?    Extremities-- +/+++  pretibial edema bilaterally, calves symmetriv  GU: penis quite swollen, unable to hold his urine which is clear Neurologic-- alert & oriented X3 and strength normal in all extremities.       Assessment & Plan:  10 days postop total thyroidectomy with multiple symptoms: Dyspnea on exertion Swollen penis Urinary incontinence EKG today without ischemic changes (discussed with cardiology) Needs further evaluation, likely will need labs, CT chest, bladder catheterization et Karie Soda. Discussed with the ER physician at [facility] hospital who accepted the patient.

## 2013-06-13 NOTE — Patient Instructions (Addendum)
Please go to the [facility] ER now, you need further testing, I already talk with the doctor there

## 2013-06-13 NOTE — Consult Note (Signed)
Reason for Consult:AKI/CKD Referring Physician: Bruce Donath, MD  [name] Wood is an 72 y.o. male.  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  He reported feeling very weak, has been in bed most of the days since the surgery. Initially he had diarrhea but now that is better.  He admitted to shortness or breath, only able to walk a few steps before he gets short of breath. Denies chest pain. Since yesterday, his penis has swollen, has developed urinary frequency "every 30 minutes".  He was sent to [facility] ER for further evaluation.  He was noted to have ARF with markedly elevated BUN/Cr. We were asked to help further evaluate his AKI.  The trend in his Scr is seen below and appears that he was developing AKI/CKD at time of discharge from his last hospitalization 06/04/13.    Of note, the patient takes alleve regularly for arthritis pain and has been on Maxzide daily.  His father was on dialysis before he died but he does not know the etiology of his kidney failure.    Trend in Creatinine: Creatinine, Ser  Date/Time Value Range Status  06/13/2013  6:32 PM 17.20* 0.50 - 1.35 mg/dL Final  1/61/0960  4:54 PM 17.03* 0.50 - 1.35 mg/dL Final  0/98/1191  4:78 AM 2.66* 0.50 - 1.35 mg/dL Final  01/03/5620  3:08 AM 1.75* 0.50 - 1.35 mg/dL Final  6/57/8469 62:95 AM 1.8* 0.4 - 1.5 mg/dL Final  2/84/1324 40:10 AM 1.6* 0.4 - 1.5 mg/dL Final  2/72/5366 44:03 AM 1.38* 0.50 - 1.35 mg/dL Final  02/28/4258 56:38 AM 1.2  0.4-1.5 mg/dL Final  75/64/3329 51:88 AM 1.5  0.4-1.5 mg/dL Final  02/22/6605 30:16 AM 1.5  0.4-1.5 mg/dL Final  0/08/9322 55:73 AM 1.2  0.4-1.5 mg/dL Final  22/12/5425  0:62 PM 1.4  0.4 - 1.5 mg/dL Final  3/76/2831 51:76 AM 1.27   Final  10/28/2006  10:14 AM 1.3  0.4-1.5 mg/dL Final    PMH:   Past Medical History  Diagnosis Date  . Allergy     rhinitis  . Hyperlipidemia   . Hypertension   . Osteoarthritis   . Erectile dysfunction   . Overweight(278.02)   . Renal insufficiency     baseline cr 1.5 11-06; cr 1.3 on 5-02  . Prostate cancer 2-11    prostate  . Prostate ca 2013    seeding implant  . 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 Tonsillectomy  1967  . Radioactive seed implant  ~2012  . Hernia repair  2009    right  . Thyroidectomy N/A 06/03/2013    Procedure: TOTAL THYROIDECTOMY;  Surgeon: Velora Heckler, MD;  Location: [facility];  Service: General;  Laterality: N/A;    Allergies: No Known Allergies  Medications:   Prior to Admission medications   Medication Sig Start Date End Date Taking? Authorizing Provider  amLODipine (NORVASC) 10 MG tablet Take 10 mg by mouth every evening.   Yes Historical Provider, MD  Calcium Carbonate-Vitamin D (CALCIUM-VITAMIN D) 500-200 MG-UNIT per tablet Take 1 tablet by mouth 2 (two) times daily with a meal.   Yes Historical Provider, MD  cholecalciferol (VITAMIN D)  1000 UNITS tablet Take 1,000 Units by mouth daily.   Yes Historical Provider, MD  fexofenadine (ALLEGRA) 180 MG tablet Take 180 mg by mouth daily as needed (allergies).    Yes Historical Provider, MD  HYDROcodone-acetaminophen (NORCO/VICODIN) 5-325 MG per tablet Take 1-2 tablets by mouth every 4 (four) hours as needed for pain.   Yes Historical Provider, MD  levothyroxine (SYNTHROID, LEVOTHROID) 100 MCG tablet Take 1 tablet (100 mcg total) by mouth daily before breakfast. 06/04/13  Yes Valarie Merino, MD  mometasone (NASONEX) 50 MCG/ACT nasal spray Place 2 sprays into the nose daily as needed (rhinitis).  04/09/11  Yes Wanda Plump, MD  multivitamin (ONE-A-DAY MEN'S) TABS Take 1 tablet by mouth daily.     Yes Historical Provider, MD  polyvinyl alcohol (LIQUIFILM  TEARS) 1.4 % ophthalmic solution Place 1 drop into both eyes daily as needed (for allergies).   Yes Historical Provider, MD  simvastatin (ZOCOR) 20 MG tablet Take 20 mg by mouth at bedtime.   Yes Historical Provider, MD  tamsulosin (FLOMAX) 0.4 MG CAPS Take 0.4 mg by mouth daily.    Yes Historical Provider, MD  triamterene-hydrochlorothiazide (DYAZIDE) 37.5-25 MG per capsule Take 1 capsule by mouth every evening.   Yes Historical Provider, MD    Inpatient medications:    Discontinued Meds:   Medications Discontinued During This Encounter  Medication Reason  . calcium carbonate (OS-CAL - DOSED IN MG OF ELEMENTAL CALCIUM) 1250 MG tablet Duplicate  . HYDROcodone-acetaminophen (NORCO/VICODIN) 5-325 MG per tablet Entry Error    Social History:  reports that he has never smoked. He has never used smokeless tobacco. He reports that  drinks alcohol. He reports that he does not use illicit drugs.  Family History:   Family History  Problem Relation Age of Onset  . Alzheimer's disease Mother   . Diabetes Father   . Kidney disease Father   . Cancer Sister     ? type  . Colon cancer Neg Hx   . Prostate cancer Neg Hx   . CAD Neg Hx     Pertinent items are noted in HPI. Weight change:  No intake or output data in the 24 hours ending 06/13/13 2032 Resp 18 General appearance: cooperative, fatigued and no distress Head: Normocephalic, without obvious abnormality, atraumatic Eyes: +scleral edema, no icterus Neck: no adenopathy, no carotid bruit, no JVD, supple, symmetrical, trachea midline and surgical scar from thyroidectomy with steri-strips in place, incision is C/D/I Resp: clear to auscultation bilaterally Cardio: regular rate and rhythm, S1, S2 normal, no murmur, click, rub or gallop GI: normoactive BS, soft, +suprapubic fullness and distended bladder Male genitalia: +penile edema Extremities: edema 1+ pitting bilateral lower extremeties Neurologic: Grossly normal  Labs: Basic  Metabolic Panel:  Recent Labs Lab 06/13/13 1609 06/13/13 1832  NA 116* 118*  K 6.4* 4.7  CL 78* 89*  CO2 14*  --   GLUCOSE 123* 98  BUN 110* 119*  CREATININE 17.03* 17.20*  CALCIUM 8.5  --    Liver Function Tests: No results found for this basename: AST, ALT, ALKPHOS, BILITOT, PROT, ALBUMIN,  in the last 168 hours No results found for this basename: LIPASE, AMYLASE,  in the last 168 hours No results found for this basename: AMMONIA,  in the last 168 hours CBC:  Recent Labs Lab 06/13/13 1340 06/13/13 1832  WBC 13.7*  --   NEUTROABS 11.7*  --   HGB 10.9* 10.2*  HCT 28.9* 30.0*  MCV 82.3  --  PLT 459*  --    PT/INR: @LABRCNTIP (inr:5) Cardiac Enzymes: )No results found for this basename: CKTOTAL, CKMB, CKMBINDEX, TROPONINI,  in the last 168 hours CBG: No results found for this basename: GLUCAP,  in the last 168 hours  Iron Studies: No results found for this basename: IRON, TIBC, TRANSFERRIN, FERRITIN,  in the last 168 hours  Xrays/Other Studies: Dg Chest 2 View  06/13/2013   *RADIOLOGY REPORT*  Clinical Data: 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 Calcification of a mildly elongated thoracic aorta. Mediastinal contours and pulmonary vascularity normal. Peribronchial thickening with atelectasis at right base. No definite infiltrate, pleural effusion or pneumothorax. Surgical clips from recent thyroidectomy noted. No acute osseous findings.  IMPRESSION: Bronchitic changes with right basilar atelectasis.   Original Report Authenticated By: Ulyses Southward, M.D.     Assessment/Plan: 1.  AKI/CKD- most likely related to obstructive uropathy +/- volume depletion/ATN/possible NSAID nephropathy or papillary necrosis (less likely).  Pt with penile edema and likely BOO from prostate cancer/radioactive seeds.  1. Renal US- being done at bedside and  marked bladder distension with >1L of urine as well as hydro 2. Stop NSAIDs/Diuretics.   3. Place foley catheter and follow UOP, daily BUN/Cr levels.   4. No indication for dialysis at this time. 2. Hyponatremia- likely due to #1 but does have pitting edema and was on thiazide as well as s/p thyroidectomy.  Will follow Serum Sodium closely after foley (check every 1-2 hours after foley) and start 1/2 NS at 11ml/hr.  Cont to follow as overcorrection is possible 3. Metabolic acidosis- should improve with foley.  Add bicarb if worsens 4. ABLA/Anemia of chronic disease- follow h/h and check iron stores 5. Hyperkalemia- should also improve with foley 6. CKD stage III- possibly related to HTN +/- NSAIDS but will also check SPEP/UPEP, renal US 7. Thyroid neoplasm s/p thyroidectomy- cont with replacement therapy 8. SOB- likely related to ARF/metabolic acidosis. Agree with V/Q scan but likely will be negative 9. Prostate Cancer 10. HTN- resume meds except for diuretics.   Eran Windish A 06/13/2013, 8:32 PM

## 2013-06-13 NOTE — Telephone Encounter (Signed)
Spoke with pt's wife to let her know of her husbands appt w/ dr. Elvera Lennox on 06/30/13 at 10:15.

## 2013-06-14 ENCOUNTER — Telehealth (INDEPENDENT_AMBULATORY_CARE_PROVIDER_SITE_OTHER): Payer: Self-pay | Admitting: *Deleted

## 2013-06-14 DIAGNOSIS — C61 Malignant neoplasm of prostate: Secondary | ICD-10-CM

## 2013-06-14 LAB — OSMOLALITY, URINE: Osmolality, Ur: 195 mOsm/kg — ABNORMAL LOW (ref 390–1090)

## 2013-06-14 LAB — BASIC METABOLIC PANEL
BUN: 93 mg/dL — ABNORMAL HIGH (ref 6–23)
BUN: 85 mg/dL — ABNORMAL HIGH (ref 6–23)
CO2: 19 mEq/L (ref 19–32)
CO2: 20 mEq/L (ref 19–32)
CO2: 25 mEq/L (ref 19–32)
Calcium: 9.1 mg/dL (ref 8.4–10.5)
Calcium: 9.2 mg/dL (ref 8.4–10.5)
Calcium: 8.9 mg/dL (ref 8.4–10.5)
Chloride: 88 mEq/L — ABNORMAL LOW (ref 96–112)
Chloride: 93 mEq/L — ABNORMAL LOW (ref 96–112)
Chloride: 93 mEq/L — ABNORMAL LOW (ref 96–112)
Creatinine, Ser: 12.75 mg/dL — ABNORMAL HIGH (ref 0.50–1.35)
Creatinine, Ser: 8.78 mg/dL — ABNORMAL HIGH (ref 0.50–1.35)
GFR calc Af Amer: 6 mL/min — ABNORMAL LOW (ref >90)
GFR calc non Af Amer: 4 mL/min — ABNORMAL LOW (ref >90)
GFR calc non Af Amer: 5 mL/min — ABNORMAL LOW (ref >90)
Glucose, Bld: 147 mg/dL — ABNORMAL HIGH (ref 70–99)
Glucose, Bld: 98 mg/dL (ref 70–99)
Glucose, Bld: 147 mg/dL — ABNORMAL HIGH (ref 70–99)
Glucose, Bld: 123 mg/dL — ABNORMAL HIGH (ref 70–99)
Potassium: 3.6 mEq/L (ref 3.5–5.1)
Sodium: 131 mEq/L — ABNORMAL LOW (ref 135–145)

## 2013-06-14 LAB — SODIUM, URINE, RANDOM: Sodium, Ur: 32 mEq/L

## 2013-06-14 LAB — CREATININE, URINE, RANDOM: Creatinine, Urine: 77.7 mg/dL

## 2013-06-14 LAB — CBC
Hemoglobin: 10.7 g/dL — ABNORMAL LOW (ref 13.0–17.0)
RBC: 3.47 MIL/uL — ABNORMAL LOW (ref 4.22–5.81)
WBC: 9 10*3/uL (ref 4.0–10.5)

## 2013-06-14 LAB — OSMOLALITY: Osmolality: 284 mOsm/kg (ref 275–300)

## 2013-06-14 LAB — PHOSPHORUS: Phosphorus: 6.1 mg/dL — ABNORMAL HIGH (ref 2.3–4.6)

## 2013-06-14 MED ORDER — HYDROMORPHONE HCL PF 1 MG/ML IJ SOLN: 1.0000 mg | INTRAMUSCULAR | Status: DC | PRN

## 2013-06-14 MED ORDER — METHOCARBAMOL 500 MG PO TABS
500.0000 mg | ORAL_TABLET | Freq: Four times a day (QID) | ORAL | Status: DC
  Administered 2013-06-14 – 2013-06-16 (×8): 500 mg via ORAL
  Filled 2013-06-14 (×10): qty 1

## 2013-06-14 NOTE — Consult Note (Signed)
Urology Consult  Referring physician: Dr Sharl Ma Reason for referral: Difficulty voiding: hesitancy, decreased stream, urgency  Chief Complaint: Difficulty voiding  History of Present Illness: [name] Wood was seen in the ER last night with 3-4 days history of generalized weakness.  He also has been having frequency, hesitancy, straining on urination, voiding small amount of urine at a time.  He had thyroidectomy for thyroid cancer.  He was found to have a creatinine of > 17 and BUN of 110, sodium 118 Renal ultrasound showed mild bilateral hydronephrosis and a markedly distended bladder.  A Foley catheter was inserted in the bladder and drained more than 1 liter of urine.  The catheter has been draining large amount of urine since.  Urinary output was 9600 ml yesterday.  He has a history of prostate cancer and had seeds implantation on 07/25/11.  His PSA was 1.99 in April down from 10.06 before treatment. His creatinine today is 8.78.   Past Medical History  Diagnosis Date  . Allergy     rhinitis  . Hyperlipidemia   . Hypertension   . Osteoarthritis   . Erectile dysfunction   . Overweight(278.02)   . Renal insufficiency     baseline cr 1.5 11-06; cr 1.3 on 5-02  . Prostate cancer 2-11    prostate  . Prostate ca 2013    seeding implant  . 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 Tonsillectomy  1967  . Radioactive seed implant  ~2012  . Hernia repair  2009    right  . Thyroidectomy N/A 06/03/2013    Procedure: TOTAL THYROIDECTOMY;  Surgeon: Velora Heckler, MD;  Location: [facility];  Service: General;  Laterality: N/A;    Medications: Flomax. Allergies: No Known Allergies  Family History  Problem Relation Age of Onset  . Alzheimer's disease Mother   . Diabetes Father   . Kidney disease Father   . Cancer Sister     ? type  . Colon cancer Neg Hx   . Prostate cancer Neg Hx   . CAD Neg Hx    Social History:  reports that he has never  smoked. He has never used smokeless tobacco. He reports that  drinks alcohol. He reports that he does not use illicit drugs.  ROS: All systems are reviewed and negative except as noted.   Physical Exam:  Vital signs in last 24 hours: Temp:  [97.4 F (36.3 C)-98.2 F (36.8 C)] 98.2 F (36.8 C) (07/22 1353) Pulse Rate:  [74-81] 77 (07/22 1353) Resp:  [18] 18 (07/22 1353) BP: (132-178)/(57-67) 132/65 mmHg (07/22 1353) SpO2:  [95 %-99 %] 96 % (07/22 1353) Weight:  [97.07 kg (214 lb)] 97.07 kg (214 lb) (07/21 2303)  Cardiovascular: Skin warm; not flushed Respiratory: Breaths quiet; no shortness of breath Abdomen: No masses Neurological: Normal sensation to touch Musculoskeletal: Normal motor function arms and legs Lymphatics: No inguinal adenopathy Skin: No rashes Genitourinary: Penis is circumcised.  Scrotum is normal in appearance.  Testicles are normal.  Laboratory Data:  Results for orders placed during the hospital encounter of 06/13/13 (from the past 72 hour(s))  CBC WITH DIFFERENTIAL     Status: Abnormal   Collection Time    06/13/13  1:40 PM      Result Value Range   WBC 13.7 (*) 4.0 - 10.5 K/uL   RBC 3.51 (*) 4.22 - 5.81 MIL/uL   Hemoglobin 10.9 (*) 13.0 - 17.0 g/dL  HCT 28.9 (*) 39.0 - 52.0 %   MCV 82.3  78.0 - 100.0 fL   MCH 31.1  26.0 - 34.0 pg   MCHC 37.7 (*) 30.0 - 36.0 g/dL   Comment: RULED OUT INTERFERING SUBSTANCES   RDW 12.5  11.5 - 15.5 %   Platelets 459 (*) 150 - 400 K/uL   Neutrophils Relative % 85 (*) 43 - 77 %   Lymphocytes Relative 11 (*) 12 - 46 %   Monocytes Relative 3  3 - 12 %   Eosinophils Relative 1  0 - 5 %   Basophils Relative 0  0 - 1 %   Neutro Abs 11.7 (*) 1.7 - 7.7 K/uL   Lymphs Abs 1.5  0.7 - 4.0 K/uL   Monocytes Absolute 0.4  0.1 - 1.0 K/uL   Eosinophils Absolute 0.1  0.0 - 0.7 K/uL   Basophils Absolute 0.0  0.0 - 0.1 K/uL   RBC Morphology POLYCHROMASIA PRESENT     Comment: BURR CELLS  POCT I-STAT TROPONIN I     Status: None    Collection Time    06/13/13  1:54 PM      Result Value Range   Troponin i, poc 0.02  0.00 - 0.08 ng/mL   Comment 3            Comment: Due to the release kinetics of cTnI,     a negative result within the first hours     of the onset of symptoms does not rule out     myocardial infarction with certainty.     If myocardial infarction is still suspected,     repeat the test at appropriate intervals.  BASIC METABOLIC PANEL     Status: Abnormal   Collection Time    06/13/13  4:09 PM      Result Value Range   Sodium 116 (*) 135 - 145 mEq/L   Comment: CRITICAL RESULT CALLED TO, READ BACK BY AND VERIFIED WITH:     HASZ,S. AT 1731 ON 07.21.14 BY LOVE,T.   Potassium 6.4 (*) 3.5 - 5.1 mEq/L   Comment: RESULTS VERIFIED VIA RECOLLECT     HEMOLYZED SPECIMEN, RESULTS MAY BE AFFECTED     HEMOLYSIS AT THIS LEVEL MAY AFFECT RESULT     HASZ,S. AT 1731 ON 07.21.14   Chloride 78 (*) 96 - 112 mEq/L   CO2 14 (*) 19 - 32 mEq/L   Glucose, Bld 123 (*) 70 - 99 mg/dL   BUN 161 (*) 6 - 23 mg/dL   Creatinine, Ser 09.60 (*) 0.50 - 1.35 mg/dL   Calcium 8.5  8.4 - 45.4 mg/dL   GFR calc non Af Amer 2 (*) >90 mL/min   GFR calc Af Amer 3 (*) >90 mL/min   Comment:            The eGFR has been calculated     using the CKD EPI equation.     This calculation has not been     validated in all clinical     situations.     eGFR's persistently     <90 mL/min signify     possible Chronic Kidney Disease.  PRO B NATRIURETIC PEPTIDE     Status: Abnormal   Collection Time    06/13/13  4:09 PM      Result Value Range   Pro B Natriuretic peptide (BNP) 267.2 (*) 0 - 125 pg/mL  POCT I-STAT, CHEM 8     Status: Abnormal  Collection Time    06/13/13  6:32 PM      Result Value Range   Sodium 118 (*) 135 - 145 mEq/L   Potassium 4.7  3.5 - 5.1 mEq/L   Chloride 89 (*) 96 - 112 mEq/L   BUN 119 (*) 6 - 23 mg/dL   Creatinine, Ser 98.11 (*) 0.50 - 1.35 mg/dL   Glucose, Bld 98  70 - 99 mg/dL   Calcium, Ion 9.14 (*) 1.13 -  1.30 mmol/L   TCO2 16  0 - 100 mmol/L   Hemoglobin 10.2 (*) 13.0 - 17.0 g/dL   HCT 78.2 (*) 95.6 - 21.3 %   Comment NOTIFIED PHYSICIAN    CK     Status: Abnormal   Collection Time    06/13/13  8:43 PM      Result Value Range   Total CK 778 (*) 7 - 232 U/L  OSMOLALITY     Status: None   Collection Time    06/13/13  8:43 PM      Result Value Range   Osmolality 284  275 - 300 mOsm/kg  OSMOLALITY, URINE     Status: Abnormal   Collection Time    06/13/13  9:55 PM      Result Value Range   Osmolality, Ur 195 (*) 390 - 1090 mOsm/kg  SODIUM, URINE, RANDOM     Status: None   Collection Time    06/13/13  9:55 PM      Result Value Range   Sodium, Ur 32    CREATININE, URINE, RANDOM     Status: None   Collection Time    06/13/13  9:55 PM      Result Value Range   Creatinine, Urine 77.7    URINALYSIS, ROUTINE W REFLEX MICROSCOPIC     Status: Abnormal   Collection Time    06/13/13  9:55 PM      Result Value Range   Color, Urine YELLOW  YELLOW   APPearance CLEAR  CLEAR   Specific Gravity, Urine 1.016  1.005 - 1.030   pH 5.0  5.0 - 8.0   Glucose, UA NEGATIVE  NEGATIVE mg/dL   Hgb urine dipstick MODERATE (*) NEGATIVE   Bilirubin Urine NEGATIVE  NEGATIVE   Ketones, ur NEGATIVE  NEGATIVE mg/dL   Protein, ur NEGATIVE  NEGATIVE mg/dL   Urobilinogen, UA 0.2  0.0 - 1.0 mg/dL   Nitrite NEGATIVE  NEGATIVE   Leukocytes, UA NEGATIVE  NEGATIVE  URINE MICROSCOPIC-ADD ON     Status: None   Collection Time    06/13/13  9:55 PM      Result Value Range   RBC / HPF 0-2  <3 RBC/hpf  BASIC METABOLIC PANEL     Status: Abnormal   Collection Time    06/13/13 10:00 PM      Result Value Range   Sodium 117 (*) 135 - 145 mEq/L   Comment: CRITICAL RESULT CALLED TO, READ BACK BY AND VERIFIED WITH:     LADKINS RN AT 2254 ON [id number] BY DLONG   Potassium 4.5  3.5 - 5.1 mEq/L   Chloride 78 (*) 96 - 112 mEq/L   Comment: DELTA CHECK NOTED   CO2 15 (*) 19 - 32 mEq/L   Glucose, Bld 94  70 - 99 mg/dL   BUN  469 (*) 6 - 23 mg/dL   Creatinine, Ser 62.95 (*) 0.50 - 1.35 mg/dL   Calcium 8.9  8.4 - 28.4 mg/dL   GFR  calc non Af Amer 2 (*) >90 mL/min   GFR calc Af Amer 3 (*) >90 mL/min   Comment:            The eGFR has been calculated     using the CKD EPI equation.     This calculation has not been     validated in all clinical     situations.     eGFR's persistently     <90 mL/min signify     possible Chronic Kidney Disease.  BASIC METABOLIC PANEL     Status: Abnormal   Collection Time    06/14/13  2:30 AM      Result Value Range   Sodium 125 (*) 135 - 145 mEq/L   Comment: DELTA CHECK NOTED   Potassium 3.8  3.5 - 5.1 mEq/L   Chloride 88 (*) 96 - 112 mEq/L   Comment: DELTA CHECK NOTED   CO2 19  19 - 32 mEq/L   Glucose, Bld 147 (*) 70 - 99 mg/dL   BUN 096 (*) 6 - 23 mg/dL   Creatinine, Ser 04.54 (*) 0.50 - 1.35 mg/dL   Calcium 9.1  8.4 - 09.8 mg/dL   GFR calc non Af Amer 3 (*) >90 mL/min   GFR calc Af Amer 4 (*) >90 mL/min   Comment:            The eGFR has been calculated     using the CKD EPI equation.     This calculation has not been     validated in all clinical     situations.     eGFR's persistently     <90 mL/min signify     possible Chronic Kidney Disease.  CBC     Status: Abnormal   Collection Time    06/14/13  6:20 AM      Result Value Range   WBC 9.0  4.0 - 10.5 K/uL   RBC 3.47 (*) 4.22 - 5.81 MIL/uL   Hemoglobin 10.7 (*) 13.0 - 17.0 g/dL   HCT 11.9 (*) 14.7 - 82.9 %   MCV 83.3  78.0 - 100.0 fL   MCH 30.8  26.0 - 34.0 pg   MCHC 37.0 (*) 30.0 - 36.0 g/dL   RDW 56.2  13.0 - 86.5 %   Platelets 490 (*) 150 - 400 K/uL  BASIC METABOLIC PANEL     Status: Abnormal   Collection Time    06/14/13  6:20 AM      Result Value Range   Sodium 129 (*) 135 - 145 mEq/L   Potassium 3.8  3.5 - 5.1 mEq/L   Chloride 90 (*) 96 - 112 mEq/L   CO2 20  19 - 32 mEq/L   Glucose, Bld 98  70 - 99 mg/dL   BUN 93 (*) 6 - 23 mg/dL   Creatinine, Ser 78.46 (*) 0.50 - 1.35 mg/dL   Calcium  9.2  8.4 - 96.2 mg/dL   GFR calc non Af Amer 4 (*) >90 mL/min   GFR calc Af Amer 5 (*) >90 mL/min   Comment:            The eGFR has been calculated     using the CKD EPI equation.     This calculation has not been     validated in all clinical     situations.     eGFR's persistently     <90 mL/min signify     possible Chronic Kidney Disease.  ALBUMIN     Status: Abnormal   Collection Time    06/14/13  6:20 AM      Result Value Range   Albumin 3.2 (*) 3.5 - 5.2 g/dL  PHOSPHORUS     Status: Abnormal   Collection Time    06/14/13  6:20 AM      Result Value Range   Phosphorus 6.1 (*) 2.3 - 4.6 mg/dL  BASIC METABOLIC PANEL     Status: Abnormal   Collection Time    06/14/13  9:40 AM      Result Value Range   Sodium 132 (*) 135 - 145 mEq/L   Potassium 3.6  3.5 - 5.1 mEq/L   Chloride 93 (*) 96 - 112 mEq/L   CO2 24  19 - 32 mEq/L   Glucose, Bld 147 (*) 70 - 99 mg/dL   BUN 85 (*) 6 - 23 mg/dL   Creatinine, Ser 1.61 (*) 0.50 - 1.35 mg/dL   Calcium 9.4  8.4 - 09.6 mg/dL   GFR calc non Af Amer 5 (*) >90 mL/min   GFR calc Af Amer 6 (*) >90 mL/min   Comment:            The eGFR has been calculated     using the CKD EPI equation.     This calculation has not been     validated in all clinical     situations.     eGFR's persistently     <90 mL/min signify     possible Chronic Kidney Disease.   No results found for this or any previous visit (from the past 240 hour(s)). Creatinine:  Recent Labs  06/13/13 1609 06/13/13 1832 06/13/13 2200 06/14/13 0230 06/14/13 0620 06/14/13 0940  CREATININE 17.03* 17.20* 16.43* 12.75* 10.37* 8.78*       Impression/Assessment:  Renal Insufficiency secondary to obstructive uropathy. Prostate cancer  Plan:   Leave Foley indwelling. Watch for post obstructive diuresis. Recheck BUN and creatinine. Continue Flomax Will follow the patient with you.  Raquel Racey-[name] 06/14/2013, 6:01 PM

## 2013-06-14 NOTE — Progress Notes (Signed)
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 Nephrology following, will also consult Urology. Hyponatremia- Sodium is improving, today 132. Likely due to dehydration. Hypothyroidism- continue synthroid. Hypertension- continue Amlodipine. DVT prophylaxis- heparin.   Code Status: Full code Family Communication: *Discussed with wife at the bedside Disposition Plan: *Home when stable   Consultants:  Nephrology  urology  Procedures:  None  Antibiotics:  *None  HPI/Subjective: Patient seen and examined, admitted with AKI secondary to obstructive uropathy, now has foley catheter in place. Renal function is improving.  Objective: Filed Vitals:   06/13/13 2032 06/13/13 2230 06/13/13 2303 06/14/13 0503  BP: 160/57 178/67 166/59 147/59  Pulse: 78 74 81 76  Temp:   97.4 F (36.3 C) 97.7 F (36.5 C)  TempSrc:   Oral Oral  Resp: 18 18 18 18   Height:   5\' 8"  (1.727 m)   Weight:   97.07 kg (214 lb)   SpO2: 95% 95% 99% 99%    Intake/Output Summary (Last 24 hours) at 06/14/13 1330 Last data filed at 06/14/13 0830  Gross per 24 hour  Intake    240 ml  Output  81191 ml  Net -10260 ml   Filed Weights   06/13/13 2303  Weight: 97.07 kg (214 lb)    Exam:   General:  *Appear in no acute distress  Cardiovascular: *S1s2 RRR  Respiratory: *Clear bilaterally, no wheezing  Abdomen: *Soft, non tender, no organomegaly  Musculoskeletal: *No edema  Data Reviewed: Basic Metabolic Panel:  Recent Labs Lab 06/13/13 1609 06/13/13 1832 06/13/13 2200 06/14/13 0230 06/14/13 0620 06/14/13 0940  NA 116* 118* 117* 125* 129* 132*  K 6.4* 4.7 4.5 3.8 3.8 3.6  CL 78* 89* 78* 88* 90* 93*  CO2 14*   --  15* 19 20 24   GLUCOSE 123* 98 94 147* 98 147*  BUN 110* 119* 110* 101* 93* 85*  CREATININE 17.03* 17.20* 16.43* 12.75* 10.37* 8.78*  CALCIUM 8.5  --  8.9 9.1 9.2 9.4  PHOS  --   --   --   --  6.1*  --    Liver Function Tests:  Recent Labs Lab 06/14/13 0620  ALBUMIN 3.2*   No results found for this basename: LIPASE, AMYLASE,  in the last 168 hours No results found for this basename: AMMONIA,  in the last 168 hours CBC:  Recent Labs Lab 06/13/13 1340 06/13/13 1832 06/14/13 0620  WBC 13.7*  --  9.0  NEUTROABS 11.7*  --   --   HGB 10.9* 10.2* 10.7*  HCT 28.9* 30.0* 28.9*  MCV 82.3  --  83.3  PLT 459*  --  490*   Cardiac Enzymes:  Recent Labs Lab 06/13/13 2043  CKTOTAL 778*   BNP (last 3 results)  Recent Labs  06/13/13 1609  PROBNP 267.2*   CBG: No results found for this basename: GLUCAP,  in the last 168 hours  No results found for this or any previous visit (from the past 240 hour(s)).   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 Calcification of a mildly  elongated thoracic aorta. Mediastinal contours and pulmonary vascularity normal. Peribronchial thickening with atelectasis at right base. No definite infiltrate, pleural effusion or pneumothorax. Surgical clips from recent thyroidectomy noted. No acute osseous findings.  IMPRESSION: Bronchitic changes with right basilar atelectasis.   Original Report Authenticated By: Ulyses Southward, M.D.   US Renal  06/13/2013   *RADIOLOGY REPORT*  Clinical Data: Acute renal failure  RENAL/URINARY TRACT ULTRASOUND COMPLETE  Comparison:  Abdominal ultrasound 02/22/2013  Findings:  Right Kidney:  Measures 13.0 cm in sagittal length.  Echogenicity of the renal parenchyma appears increased compared to the adjacent liver.  There  is mild prominence of the right renal collecting system and calyces consistent with mild hydronephrosis.  Several simple renal cysts are again seen.  The largest cyst is exophytic from the mid pole measuring 4.0 x 4.0 x 4.0 cm.  The second largest cyst is exophytic from the upper pole measuring 2.6 by 2.8 x 2.3 cm.  Left Kidney:  The left kidney measures 11.0 cm in sagittal length. Renal cortex appears slightly echogenic.  There is mild fullness of the left renal collecting system, suggesting mild hydronephrosis. Three small cysts are seen, largest measuring 1.7 x 1.6 x 1.4 cm, extending exophytically from the lower pole.  Bladder:  The urinary bladder is very distended at the time of the imaging.  The sonographer reports that the patient had voided just prior to the examination.  The urinary bladder wall thickness appears normal.  Bladder volume is calculated to be 1239 cm cubed.  A small amount of ascites was noted adjacent to the liver.  IMPRESSION:  1.  Mild  bilateral hydronephrosis and very distended urinary bladder. The fullness of both renal collecting systems could be secondary to the patient's very distended urinary bladder.  The urinary bladder is distended, despite the patient having reportedly voided prior to performance of the ultrasound. Question if the patient could have bladder outlet obstruction. 2.  Slight increased echogenicity of the renal cortices could be due to chronic medical renal disease. 3.  Very small amount of ascites noted adjacent to the liver. 4.  Bilateral simple renal cysts.   Original Report Authenticated By: Britta Mccreedy, M.D.   Nm Pulmonary Perf And Vent  06/13/2013   *RADIOLOGY REPORT*  Clinical Data: Shortness of breath  NM PULMONARY VENTILATION AND PERFUSION SCAN  Views:  Anterior, posterior, right lateral, left lateral, RPO, LPO, RAO, LAO - ventilation and perfusion  Radiopharmaceutical: Technetium 40m DTPA - ventilation; Technetium 10m macroaggregated albumin - perfusion   Dose:  40.0 mCi - ventilation; 4.8 mCi - perfusion  Route of administration:  Inhalation - ventilation; intravenous - perfusion  Comparison: Chest radiograph June 13, 2013  Findings:  The ventilation study shows homogeneous and symmetric uptake of radiotracer bilaterally.  The perfusion study shows homogeneous and symmetric uptake of radiotracer bilaterally.  There is no appreciable ventilation / perfusion mismatch.  IMPRESSION: Normal studies.  Very low probability of pulmonary embolus.   Original Report Authenticated By: Bretta Bang, M.D.    Scheduled Meds: . amLODipine  10 mg Oral QPM  . fluticasone  2 spray Each Nare Daily  . heparin  5,000 Units Subcutaneous Q8H  . levothyroxine  100 mcg Oral QAC breakfast  . loratadine  10 mg Oral Daily  . methocarbamol  500 mg Oral QID  . multivitamin with minerals  1 tablet Oral Daily  . simvastatin  20 mg Oral QHS  . tamsulosin  0.4 mg Oral Daily   Continuous Infusions: .  sodium chloride 50 mL/hr at 06/14/13 1058    Principal Problem:   Acute kidney failure Active Problems:   Hyponatremia   Obstructive uropathy    Time spent: *35 min    [facility]  [facility] Pager [phone]. If 7PM-7AM, please contact night-coverage at [email or web address], password [facility] 06/14/2013, 1:30 PM  LOS: 1 day

## 2013-06-14 NOTE — Plan of Care (Signed)
Problem: Consults Goal: General Medical Patient Education See Patient Education Module for specific education. Outcome: Progressing Pt and wife instructed on safety precautions, when to call for help when needing to get oob. Medications and IV fluids explained to patient. General routine measures.

## 2013-06-14 NOTE — Care Management Note (Signed)
CARE MANAGEMENT NOTE 06/14/2013  Patient:  ODEL, SCHMID   Account Number:  [id number]  Date Initiated:  06/14/2013  Documentation initiated by:  Ilana Prezioso  Subjective/Objective Assessment:   72 yo male admitted with acute kidney failure and hyponatremia.     Action/Plan:   Home when stable   Anticipated DC Date:     Anticipated DC Plan:  HOME/SELF CARE      DC Planning Services  CM consult      Choice offered to / List presented to:  NA   DME arranged  NA      DME agency  NA     HH arranged  NA      HH agency  NA   Status of service:  In process, will continue to follow Medicare Important Message given?   (If response is "NO", the following Medicare IM given date fields will be blank) Date Medicare IM given:   Date Additional Medicare IM given:    Discharge Disposition:    Per UR Regulation:  Reviewed for med. necessity/level of care/duration of stay  If discussed at Long Length of Stay Meetings, dates discussed:    Comments:  06/14/13 1338 Nabilah Davoli,RN,BSN [phone] Chart reviewed for utilization of services. No needs identified at this time. PCP: [name], MD.

## 2013-06-14 NOTE — ED Provider Notes (Signed)
Medical screening examination/treatment/procedure(s) were conducted as a shared visit with non-physician practitioner(s) and myself.  I personally evaluated the patient during the encounter   Ashby Dawes, MD 06/14/13 1009

## 2013-06-14 NOTE — Progress Notes (Signed)
Subjective: Interval History: has no complaint, feels much better.  Objective: Vital signs in last 24 hours: Temp:  [97.3 F (36.3 C)-97.7 F (36.5 C)] 97.7 F (36.5 C) (07/22 0503) Pulse Rate:  [74-81] 76 (07/22 0503) Resp:  [18] 18 (07/22 0503) BP: (147-180)/(57-80) 147/59 mmHg (07/22 0503) SpO2:  [95 %-99 %] 99 % (07/22 0503) Weight:  [97.07 kg (214 lb)-97.251 kg (214 lb 6.4 oz)] 97.07 kg (214 lb) (07/21 2303) Weight change:   Intake/Output from previous day: 07/21 0701 - 07/22 0700 In: -  Out: 9600 [Urine:9600] Intake/Output this shift: Total I/O In: 240 [P.O.:240] Out: 900 [Urine:900]  General appearance: alert, cooperative and no distress Resp: clear to auscultation bilaterally Cardio: S1, S2 normal GI: pos bs, liver down 4 cm, soft Extremities: edema 1+  Lab Results:  Recent Labs  06/13/13 1340 06/13/13 1832 06/14/13 0620  WBC 13.7*  --  9.0  HGB 10.9* 10.2* 10.7*  HCT 28.9* 30.0* 28.9*  PLT 459*  --  490*   BMET:  Recent Labs  06/14/13 0620 06/14/13 0940  NA 129* 132*  K 3.8 3.6  CL 90* 93*  CO2 20 24  GLUCOSE 98 147*  BUN 93* 85*  CREATININE 10.37* 8.78*  CALCIUM 9.2 9.4   No results found for this basename: PTH,  in the last 72 hours Iron Studies: No results found for this basename: IRON, TIBC, TRANSFERRIN, FERRITIN,  in the last 72 hours  Studies/Results: Dg Chest 2 View  06/13/2013   *RADIOLOGY REPORT*  Clinical Data: 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 Calcification of a mildly elongated thoracic aorta. Mediastinal contours and pulmonary vascularity normal. Peribronchial thickening with atelectasis at right base. No definite infiltrate, pleural effusion or pneumothorax. Surgical clips from recent thyroidectomy noted. No acute osseous findings.  IMPRESSION: Bronchitic changes with right basilar  atelectasis.   Original Report Authenticated By: Ulyses Southward, M.D.   US Renal  06/13/2013   *RADIOLOGY REPORT*  Clinical Data: Acute renal failure  RENAL/URINARY TRACT ULTRASOUND COMPLETE  Comparison:  Abdominal ultrasound 02/22/2013  Findings:  Right Kidney:  Measures 13.0 cm in sagittal length.  Echogenicity of the renal parenchyma appears increased compared to the adjacent liver.  There is mild prominence of the right renal collecting system and calyces consistent with mild hydronephrosis.  Several simple renal cysts are again seen.  The largest cyst is exophytic from the mid pole measuring 4.0 x 4.0 x 4.0 cm.  The second largest cyst is exophytic from the upper pole measuring 2.6 by 2.8 x 2.3 cm.  Left Kidney:  The left kidney measures 11.0 cm in sagittal length. Renal cortex appears slightly echogenic.  There is mild fullness of the left renal collecting system, suggesting mild hydronephrosis. Three small cysts are seen, largest measuring 1.7 x 1.6 x 1.4 cm, extending exophytically from the lower pole.  Bladder:  The urinary bladder is very distended at the time of the imaging.  The sonographer reports that the patient had voided just prior to the examination.  The urinary bladder wall thickness appears normal.  Bladder volume is calculated to be 1239 cm cubed.  A small amount of ascites was noted adjacent to the liver.  IMPRESSION:  1.  Mild  bilateral hydronephrosis and very distended urinary bladder. The fullness of both renal collecting systems could be secondary to the patient's very distended urinary bladder.  The  urinary bladder is distended, despite the patient having reportedly voided prior to performance of the ultrasound. Question if the patient could have bladder outlet obstruction. 2.  Slight increased echogenicity of the renal cortices could be due to chronic medical renal disease. 3.  Very small amount of ascites noted adjacent to the liver. 4.  Bilateral simple renal cysts.   Original Report  Authenticated By: Britta Mccreedy, M.D.   Nm Pulmonary Perf And Vent  06/13/2013   *RADIOLOGY REPORT*  Clinical Data: Shortness of breath  NM PULMONARY VENTILATION AND PERFUSION SCAN  Views:  Anterior, posterior, right lateral, left lateral, RPO, LPO, RAO, LAO - ventilation and perfusion  Radiopharmaceutical: Technetium 53m DTPA - ventilation; Technetium 57m macroaggregated albumin - perfusion  Dose:  40.0 mCi - ventilation; 4.8 mCi - perfusion  Route of administration:  Inhalation - ventilation; intravenous - perfusion  Comparison: Chest radiograph June 13, 2013  Findings:  The ventilation study shows homogeneous and symmetric uptake of radiotracer bilaterally.  The perfusion study shows homogeneous and symmetric uptake of radiotracer bilaterally.  There is no appreciable ventilation / perfusion mismatch.  IMPRESSION: Normal studies.  Very low probability of pulmonary embolus.   Original Report Authenticated By: Bretta Bang, M.D.    I have reviewed the patient's current medications.  Assessment/Plan: 1 AKI obstructive uropathy.  Vol improving, acid/base improving, K ok.  S Na improving with hypo natremic diuresis.  Cause was dilution with no urine.  Treatment is foley. Would like to not let Na rise too rapidly. 2 BOO 3 Anemia 4 Prostate Ca 5 Thyroid Ca P ^ hypotonic fluids, follow Labs    LOS: 1 day   Dartagnan Beavers L 06/14/2013,10:59 AM

## 2013-06-14 NOTE — Telephone Encounter (Signed)
Patient's wife called to let us know that patient has been admitted to the hospital [id number] due to kidney issues.  Wife states patient will not be able to have lab work drawn that was ordered by Dr. Gerrit Friends or come to his appt on Friday.  Wife states they have been told patient will be in the hospital for several days.  Appt for Friday cancelled at this time per wife's request.

## 2013-06-14 NOTE — ED Provider Notes (Signed)
Medical screening examination/treatment/procedure(s) were performed by non-physician practitioner and as supervising physician I was immediately available for consultation/collaboration.  Toy Baker, MD 06/14/13 [phone]

## 2013-06-14 NOTE — Plan of Care (Signed)
Problem: Consults Goal: Nutrition Consult-if indicated Outcome: Progressing Pt is on a regular diet at home, but made aware that he would be on a renal diet while here. Pt and wife voiced understanding.

## 2013-06-15 DIAGNOSIS — I10 Essential (primary) hypertension: Secondary | ICD-10-CM

## 2013-06-15 LAB — BASIC METABOLIC PANEL
BUN: 49 mg/dL — ABNORMAL HIGH (ref 6–23)
BUN: 41 mg/dL — ABNORMAL HIGH (ref 6–23)
BUN: 30 mg/dL — ABNORMAL HIGH (ref 6–23)
CO2: 27 mEq/L (ref 19–32)
CO2: 28 mEq/L (ref 19–32)
Calcium: 8.7 mg/dL (ref 8.4–10.5)
Calcium: 9.1 mg/dL (ref 8.4–10.5)
Chloride: 96 mEq/L (ref 96–112)
Creatinine, Ser: 3.55 mg/dL — ABNORMAL HIGH (ref 0.50–1.35)
Creatinine, Ser: 2.75 mg/dL — ABNORMAL HIGH (ref 0.50–1.35)
Creatinine, Ser: 1.94 mg/dL — ABNORMAL HIGH (ref 0.50–1.35)
GFR calc Af Amer: 38 mL/min — ABNORMAL LOW (ref >90)
GFR calc non Af Amer: 22 mL/min — ABNORMAL LOW (ref >90)
GFR calc non Af Amer: 33 mL/min — ABNORMAL LOW (ref >90)
Glucose, Bld: 119 mg/dL — ABNORMAL HIGH (ref 70–99)
Glucose, Bld: 139 mg/dL — ABNORMAL HIGH (ref 70–99)
Glucose, Bld: 118 mg/dL — ABNORMAL HIGH (ref 70–99)
Potassium: 3.6 mEq/L (ref 3.5–5.1)
Sodium: 133 mEq/L — ABNORMAL LOW (ref 135–145)

## 2013-06-15 LAB — CBC
HCT: 26.8 % — ABNORMAL LOW (ref 39.0–52.0)
Hemoglobin: 9.6 g/dL — ABNORMAL LOW (ref 13.0–17.0)
MCH: 30.7 pg (ref 26.0–34.0)
MCV: 85.6 fL (ref 78.0–100.0)
RBC: 3.13 MIL/uL — ABNORMAL LOW (ref 4.22–5.81)
WBC: 11.7 10*3/uL — ABNORMAL HIGH (ref 4.0–10.5)

## 2013-06-15 LAB — PROTEIN ELECTROPHORESIS, SERUM
Alpha-2-Globulin: 15.5 % — ABNORMAL HIGH (ref 7.1–11.8)
Gamma Globulin: 13.6 % (ref 11.1–18.8)
M-Spike, %: NOT DETECTED g/dL

## 2013-06-15 MED ORDER — PNEUMOCOCCAL VAC POLYVALENT 25 MCG/0.5ML IJ INJ
0.5000 mL | INJECTION | INTRAMUSCULAR | Status: AC
  Administered 2013-06-16: 0.5 mL via INTRAMUSCULAR
  Filled 2013-06-15 (×2): qty 0.5

## 2013-06-15 NOTE — Progress Notes (Signed)
[facility] PROGRESS NOTE Assessment/Plan: Acute kidney failure: - due obstructive uropathy. - foley, monitor electrolytes. - cont hypotonic fluids, b-met in am. - cont to be negative.  Hyponatremia - sodium raising has slow down, cont to monitor.  Obstructive uropathy - Foley placed, urology consulted. - Flomax.     Code Status: Full code  Family Communication: *Discussed with wife at the bedside  Disposition Plan: *Home when stable    Consultants:  Nephro  Urology  Procedures:  none  Antibiotics:  None  HPI/Subjective: No complains  Objective: Filed Vitals:   06/14/13 0503 06/14/13 1353 06/14/13 2126 06/15/13 0607  BP: 147/59 132/65 143/63 129/58  Pulse: 76 77 68 65  Temp: 97.7 F (36.5 C) 98.2 F (36.8 C) 97.9 F (36.6 C) 98.4 F (36.9 C)  TempSrc: Oral Oral Oral Oral  Resp: 18 18 16 18   Height:      Weight:      SpO2: 99% 96% 98% 97%    Intake/Output Summary (Last 24 hours) at 06/15/13 0922 Last data filed at 06/15/13 4782  Gross per 24 hour  Intake 957.83 ml  Output   3450 ml  Net -2492.17 ml   Filed Weights   06/13/13 2303  Weight: 97.07 kg (214 lb)    Exam:  General: Alert, awake, oriented x3, in no acute distress.  HEENT: No bruits, no goiter. dressing Heart: Regular rate and rhythm, without murmurs, rubs, gallops.  Lungs: Good air movement, bilateral air movement.  Abdomen: Soft, nontender, nondistended, positive bowel sounds.  Neuro: Grossly intact, nonfocal.   Data Reviewed: Basic Metabolic Panel:  Recent Labs Lab 06/14/13 0230 06/14/13 0620 06/14/13 0940 06/14/13 2220 06/15/13 0405  NA 125* 129* 132* 131* 134*  K 3.8 3.8 3.6 3.9 3.7  CL 88* 90* 93* 93* 96  CO2 19 20 24 25 27   GLUCOSE 147* 98 147* 123* 119*  BUN 101* 93* 85* 58* 49*  CREATININE 12.75* 10.37* 8.78* 4.42* 3.55*  CALCIUM 9.1 9.2 9.4 8.9 8.7  PHOS  --  6.1*  --   --   --    Liver Function Tests:  Recent Labs Lab 06/14/13 0620   ALBUMIN 3.2*   No results found for this basename: LIPASE, AMYLASE,  in the last 168 hours No results found for this basename: AMMONIA,  in the last 168 hours CBC:  Recent Labs Lab 06/13/13 1340 06/13/13 1832 06/14/13 0620 06/15/13 0405  WBC 13.7*  --  9.0 11.7*  NEUTROABS 11.7*  --   --   --   HGB 10.9* 10.2* 10.7* 9.6*  HCT 28.9* 30.0* 28.9* 26.8*  MCV 82.3  --  83.3 85.6  PLT 459*  --  490* 471*   Cardiac Enzymes:  Recent Labs Lab 06/13/13 2043  CKTOTAL 778*   BNP (last 3 results)  Recent Labs  06/13/13 1609  PROBNP 267.2*   CBG: No results found for this basename: GLUCAP,  in the last 168 hours  No results found for this or any previous visit (from the past 240 hour(s)).   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 Calcification of a mildly elongated thoracic aorta. Mediastinal contours and pulmonary vascularity normal. Peribronchial thickening with atelectasis at right base. No definite infiltrate, pleural effusion or pneumothorax. Surgical clips from recent thyroidectomy noted. No  acute osseous findings.  IMPRESSION: Bronchitic changes with right basilar atelectasis.   Original Report Authenticated By: Ulyses Southward, M.D.   US Renal  06/13/2013   *RADIOLOGY REPORT*  Clinical Data: Acute renal failure  RENAL/URINARY TRACT ULTRASOUND COMPLETE  Comparison:  Abdominal ultrasound 02/22/2013  Findings:  Right Kidney:  Measures 13.0 cm in sagittal length.  Echogenicity of the renal parenchyma appears increased compared to the adjacent liver.  There is mild prominence of the right renal collecting system and calyces consistent with mild hydronephrosis.  Several simple renal cysts are again seen.  The largest cyst is exophytic from the mid pole measuring 4.0 x 4.0 x 4.0 cm.   The second largest cyst is exophytic from the upper pole measuring 2.6 by 2.8 x 2.3 cm.  Left Kidney:  The left kidney measures 11.0 cm in sagittal length. Renal cortex appears slightly echogenic.  There is mild fullness of the left renal collecting system, suggesting mild hydronephrosis. Three small cysts are seen, largest measuring 1.7 x 1.6 x 1.4 cm, extending exophytically from the lower pole.  Bladder:  The urinary bladder is very distended at the time of the imaging.  The sonographer reports that the patient had voided just prior to the examination.  The urinary bladder wall thickness appears normal.  Bladder volume is calculated to be 1239 cm cubed.  A small amount of ascites was noted adjacent to the liver.  IMPRESSION:  1.  Mild  bilateral hydronephrosis and very distended urinary bladder. The fullness of both renal collecting systems could be secondary to the patient's very distended urinary bladder.  The urinary bladder is distended, despite the patient having reportedly voided prior to performance of the ultrasound. Question if the patient could have bladder outlet obstruction. 2.  Slight increased echogenicity of the renal cortices could be due to chronic medical renal disease. 3.  Very small amount of ascites noted adjacent to the liver. 4.  Bilateral simple renal cysts.   Original Report Authenticated By: Britta Mccreedy, M.D.   Nm Pulmonary Perf And Vent  06/13/2013   *RADIOLOGY REPORT*  Clinical Data: Shortness of breath  NM PULMONARY VENTILATION AND PERFUSION SCAN  Views:  Anterior, posterior, right lateral, left lateral, RPO, LPO, RAO, LAO - ventilation and perfusion  Radiopharmaceutical: Technetium 80m DTPA - ventilation; Technetium 23m macroaggregated albumin - perfusion  Dose:  40.0 mCi - ventilation; 4.8 mCi - perfusion  Route of administration:  Inhalation - ventilation; intravenous - perfusion  Comparison: Chest radiograph June 13, 2013  Findings:  The ventilation study shows homogeneous  and symmetric uptake of radiotracer bilaterally.  The perfusion study shows homogeneous and symmetric uptake of radiotracer bilaterally.  There is no appreciable ventilation / perfusion mismatch.  IMPRESSION: Normal studies.  Very low probability of pulmonary embolus.   Original Report Authenticated By: Bretta Bang, M.D.    Scheduled Meds: . amLODipine  10 mg Oral QPM  . fluticasone  2 spray Each Nare Daily  . heparin  5,000 Units Subcutaneous Q8H  . levothyroxine  100 mcg Oral QAC breakfast  . loratadine  10 mg Oral Daily  . methocarbamol  500 mg Oral QID  . multivitamin with minerals  1 tablet Oral Daily  . [START ON 06/16/2013] pneumococcal 23 valent vaccine  0.5 mL Intramuscular Tomorrow-1000  . simvastatin  20 mg Oral QHS  . tamsulosin  0.4 mg Oral Daily   Continuous Infusions: . sodium chloride 50 mL/hr at 06/15/13 0430     FELIZ ORTIZ, Darin Engels  [facility] Pager [phone]. If 8PM-8AM, please contact night-coverage at [email or web address], password [facility] 06/15/2013, 9:22 AM  LOS: 2 days

## 2013-06-15 NOTE — Progress Notes (Signed)
Subjective: Interval History: feels better, creat down 2.75, 4.5L UOP yesterday  Objective: Vital signs in last 24 hours: Temp:  [97.9 F (36.6 C)-98.4 F (36.9 C)] 98.4 F (36.9 C) (07/23 1456) Pulse Rate:  [65-71] 71 (07/23 1456) Resp:  [16-18] 18 (07/23 1456) BP: (120-143)/(58-71) 120/71 mmHg (07/23 1456) SpO2:  [97 %-98 %] 98 % (07/23 1456) Weight change:   Intake/Output from previous day: 07/22 0701 - 07/23 0700 In: 1197.8 [P.O.:720; I.V.:477.8] Out: 4350 [Urine:4350] Intake/Output this shift: Total I/O In: 480 [P.O.:480] Out: 1400 [Urine:1400]  General appearance: alert, cooperative and no distress Resp: clear to auscultation bilaterally Cardio: S1, S2 normal GI: pos bs, liver down 4 cm, soft Extremities: edema 1+  Lab Results:  Recent Labs  06/14/13 0620 06/15/13 0405  WBC 9.0 11.7*  HGB 10.7* 9.6*  HCT 28.9* 26.8*  PLT 490* 471*   BMET:   Recent Labs  06/15/13 0405 06/15/13 0945  NA 134* 133*  K 3.7 3.3*  CL 96 93*  CO2 27 28  GLUCOSE 119* 139*  BUN 49* 41*  CREATININE 3.55* 2.75*  CALCIUM 8.7 9.1   No results found for this basename: PTH,  in the last 72 hours Iron Studies: No results found for this basename: IRON, TIBC, TRANSFERRIN, FERRITIN,  in the last 72 hours  Studies/Results: US Renal  06/13/2013   *RADIOLOGY REPORT*  Clinical Data: Acute renal failure  RENAL/URINARY TRACT ULTRASOUND COMPLETE  Comparison:  Abdominal ultrasound 02/22/2013  Findings:  Right Kidney:  Measures 13.0 cm in sagittal length.  Echogenicity of the renal parenchyma appears increased compared to the adjacent liver.  There is mild prominence of the right renal collecting system and calyces consistent with mild hydronephrosis.  Several simple renal cysts are again seen.  The largest cyst is exophytic from the mid pole measuring 4.0 x 4.0 x 4.0 cm.  The second largest cyst is exophytic from the upper pole measuring 2.6 by 2.8 x 2.3 cm.  Left Kidney:  The left kidney  measures 11.0 cm in sagittal length. Renal cortex appears slightly echogenic.  There is mild fullness of the left renal collecting system, suggesting mild hydronephrosis. Three small cysts are seen, largest measuring 1.7 x 1.6 x 1.4 cm, extending exophytically from the lower pole.  Bladder:  The urinary bladder is very distended at the time of the imaging.  The sonographer reports that the patient had voided just prior to the examination.  The urinary bladder wall thickness appears normal.  Bladder volume is calculated to be 1239 cm cubed.  A small amount of ascites was noted adjacent to the liver.  IMPRESSION:  1.  Mild  bilateral hydronephrosis and very distended urinary bladder. The fullness of both renal collecting systems could be secondary to the patient's very distended urinary bladder.  The urinary bladder is distended, despite the patient having reportedly voided prior to performance of the ultrasound. Question if the patient could have bladder outlet obstruction. 2.  Slight increased echogenicity of the renal cortices could be due to chronic medical renal disease. 3.  Very small amount of ascites noted adjacent to the liver. 4.  Bilateral simple renal cysts.   Original Report Authenticated By: Britta Mccreedy, M.D.   Nm Pulmonary Perf And Vent  06/13/2013   *RADIOLOGY REPORT*  Clinical Data: Shortness of breath  NM PULMONARY VENTILATION AND PERFUSION SCAN  Views:  Anterior, posterior, right lateral, left lateral, RPO, LPO, RAO, LAO - ventilation and perfusion  Radiopharmaceutical: Technetium 55m DTPA - ventilation;  Technetium 41m macroaggregated albumin - perfusion  Dose:  40.0 mCi - ventilation; 4.8 mCi - perfusion  Route of administration:  Inhalation - ventilation; intravenous - perfusion  Comparison: Chest radiograph June 13, 2013  Findings:  The ventilation study shows homogeneous and symmetric uptake of radiotracer bilaterally.  The perfusion study shows homogeneous and symmetric uptake of  radiotracer bilaterally.  There is no appreciable ventilation / perfusion mismatch.  IMPRESSION: Normal studies.  Very low probability of pulmonary embolus.   Original Report Authenticated By: Bretta Bang, M.D.    I have reviewed the patient's current medications.  Assessment/Plan: 1 AKI obstructive uropathy- resolving with foley in place 2 BOO 3 Anemia 4 Prostate Ca 5 Thyroid Ca  P- expect continued improvement, will sign off. Please call as needed    Feras Gardella D 06/15/2013,3:55 PM

## 2013-06-15 NOTE — Progress Notes (Signed)
  Subjective: Patient reports No pain or discomfort  Objective: Vital signs in last 24 hours: Temp:  [98.4 F (36.9 C)-99 F (37.2 C)] 99 F (37.2 C) (07/23 2139) Pulse Rate:  [65-71] 71 (07/23 2139) Resp:  [18] 18 (07/23 2139) BP: (120-142)/(58-71) 142/66 mmHg (07/23 2139) SpO2:  [97 %-98 %] 98 % (07/23 2139)  Intake/Output from previous day: 07/22 0701 - 07/23 0700 In: 1197.8 [P.O.:720; I.V.:477.8] Out: 4350 [Urine:4350] Intake/Output this shift: Total I/O In: 240 [P.O.:240] Out: 1700 [Urine:1700]  Physical Exam:  General:Alert and oriented In no acute distress Foley draining clear urine. Urinary output: 4350 ml Creat: 2.75  Lab Results:  Recent Labs  06/13/13 1832 06/14/13 0620 06/15/13 0405  HGB 10.2* 10.7* 9.6*  HCT 30.0* 28.9* 26.8*   BMET  Recent Labs  06/15/13 0405 06/15/13 0945  NA 134* 133*  K 3.7 3.3*  CL 96 93*  CO2 27 28  GLUCOSE 119* 139*  BUN 49* 41*  CREATININE 3.55* 2.75*  CALCIUM 8.7 9.1   No results found for this basename: LABPT, INR,  in the last 72 hours No results found for this basename: LABURIN,  in the last 72 hours No results found for this or any previous visit.  Studies/Results: No results found.  Assessment/Plan:  Renal insufficiency: Improving  Obstructive uropathy  Prostate cancer  Thyroid cancer  Continue Flomax.    Leave Foley indwelling.   LOS: 2 days   Jesika Men-[name] 06/15/2013, 10:00 PM

## 2013-06-15 NOTE — Care Management Note (Signed)
Cm spoke with patient at bedside concerning discharge planning with spouse present. PTA pt independent. PCP: [name], MD. Pt uses [facility] pharmacy on [address]. Pt has steri strips on neck from previous surgery. Per pt MD to remove strips at follow up appt Friday 7/25. Pt currently has foley catheter. Pt anticipates dc of foley prior to discharge. No barriers identified at this time.   Roxy Manns Charlene Cowdrey,RN,BSN [phone]

## 2013-06-16 LAB — BASIC METABOLIC PANEL
BUN: 24 mg/dL — ABNORMAL HIGH (ref 6–23)
Calcium: 8.9 mg/dL (ref 8.4–10.5)
GFR calc Af Amer: 42 mL/min — ABNORMAL LOW (ref >90)
GFR calc non Af Amer: 36 mL/min — ABNORMAL LOW (ref >90)
Potassium: 3.8 mEq/L (ref 3.5–5.1)
Sodium: 136 mEq/L (ref 135–145)

## 2013-06-16 MED ORDER — SODIUM CHLORIDE 0.9 % IV SOLN
INTRAVENOUS | Status: AC
  Administered 2013-06-16: 10:00:00 via INTRAVENOUS

## 2013-06-16 NOTE — Care Management Note (Signed)
Cm spoke with patient concerning discharge planning. MD order for [facility] for foley care. Per pt choice [facility] to provide [facility] services upon discharge. [facility] liasion [employer] notified. Pt's spouse to provide home care assistance and provide tx home. No other barriers identified.   Roxy Manns Masie Bermingham,RN,BSN [phone]

## 2013-06-16 NOTE — Discharge Summary (Signed)
Physician Discharge Summary  [name] Wood ZOX:[id number] DOB: [birth year] DOA: 06/13/2013  PCP: [name], MD  Admit date: 06/13/2013 Discharge date: 06/16/2013  Time spent: 35 minutes  Recommendations for Outpatient Follow-up:  1. Follow up with PCP next week, check b-met and Bp if creatinine back to baseline restart antihypertensive. 2. Follow up with Dr. Brunilda Payor in 1-2 week for obstructive uropathy.  Discharge Diagnoses:  Principal Problem:   Acute kidney failure Active Problems:   Hyponatremia   Obstructive uropathy   Discharge Condition: stable  Diet recommendation: heart healthy  Filed Weights   06/13/13 2303  Weight: 97.07 kg (214 lb)    History of present illness:  72 y.o. male who presents with generalized weakness and SOB. 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 He states he has passed some urine since then but is unclear regarding how much. He has been bedbound for most of the days following the surgery. Admits to urinary frequency "every 30 mins" because of his symptoms he presented to the ER for evaluation.   Hospital Course:  Acute kidney failure:  - due obstructive uropathy. Renal US perform showed hydronephrosis B/L. - Foley placed had good urine output.  - b-met at PCP office.  Hyponatremia  - due to obstructive uropathy. - improved as cr. Improved.  Obstructive uropathy  - Foley placed, urology consulted. Follow up with urology as an outpatient. - Flomax.   Procedures:  V/Q scna negative for PE.  Renal US : Mild bilateral hydronephrosis and very distended urinary bladder. The fullness of both renal collecting systems could be secondary to the patient's very distended urinary bladder. The urinary bladder is distended.   Consultations:  Renal  urology  Discharge Exam: Filed Vitals:   06/15/13 0607 06/15/13 1456 06/15/13 2139 06/16/13 0601  BP: 129/58 120/71 142/66 124/54  Pulse: 65  71 71 63  Temp: 98.4 F (36.9 C) 98.4 F (36.9 C) 99 F (37.2 C) 98.1 F (36.7 C)  TempSrc: Oral Oral Oral Oral  Resp: 18 18 18 18   Height:      Weight:      SpO2: 97% 98% 98% 95%    General: A&O x3 Cardiovascular: RRR Respiratory: good air movement CTA B/L  Discharge Instructions  Discharge Orders   Future Appointments Provider Department Dept Phone   06/30/2013 10:30 AM Carlus Pavlov, MD [facility] PRIMARY [facility] ENDOCRINOLOGY [phone]   Future Orders Complete By Expires     Diet - low sodium heart healthy  As directed     Increase activity slowly  As directed         Medication List    STOP taking these medications       triamterene-hydrochlorothiazide 37.5-25 MG per capsule  Commonly known as:  DYAZIDE      TAKE these medications       amLODipine 10 MG tablet  Commonly known as:  NORVASC  Take 10 mg by mouth every evening.     calcium-vitamin D 500-200 MG-UNIT per tablet  Take 1 tablet by mouth 2 (two) times daily with a meal.     cholecalciferol 1000 UNITS tablet  Commonly known as:  VITAMIN D  Take 1,000 Units by mouth daily.     fexofenadine 180 MG tablet  Commonly known as:  ALLEGRA  Take 180 mg by mouth daily as needed (allergies).     HYDROcodone-acetaminophen 5-325 MG per tablet  Commonly known as:  NORCO/VICODIN  Take 1-2 tablets  by mouth every 4 (four) hours as needed for pain.     levothyroxine 100 MCG tablet  Commonly known as:  SYNTHROID, LEVOTHROID  Take 1 tablet (100 mcg total) by mouth daily before breakfast.     mometasone 50 MCG/ACT nasal spray  Commonly known as:  NASONEX  Place 2 sprays into the nose daily as needed (rhinitis).     multivitamin Tabs  Take 1 tablet by mouth daily.     polyvinyl alcohol 1.4 % ophthalmic solution  Commonly known as:  LIQUIFILM TEARS  Place 1 drop into both eyes daily as needed (for allergies).     simvastatin 20 MG tablet  Commonly known as:  ZOCOR  Take 20 mg by mouth at bedtime.      tamsulosin 0.4 MG Caps  Commonly known as:  FLOMAX  Take 0.4 mg by mouth daily.       No Known Allergies     Follow-up Information   Follow up with [name], MD In 1 week. (follow up on Bp medications.)    Contact information:   [address] [facility] [address] [place] [address] [phone]        The results of significant diagnostics from this hospitalization (including imaging, microbiology, ancillary and laboratory) are listed below for reference.    Significant Diagnostic Studies: Dg Chest 2 View  06/13/2013   *RADIOLOGY REPORT*  Clinical Data: 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 Calcification of a mildly elongated thoracic aorta. Mediastinal contours and pulmonary vascularity normal. Peribronchial thickening with atelectasis at right base. No definite infiltrate, pleural effusion or pneumothorax. Surgical clips from recent thyroidectomy noted. No acute osseous findings.  IMPRESSION: Bronchitic changes with right basilar atelectasis.   Original Report Authenticated By: Ulyses Southward, M.D.   Dg Chest 2 View  05/31/2013   *RADIOLOGY REPORT*  Clinical Data: Preoperative evaluation for total thyroidectomy for multinodular goiter.  Nonsmoker.  Hypertension.  No current chest complaints  CHEST - 2 VIEW  Comparison: 06/24/2011  Findings: Lower lung volumes are present than on the prior exam with some resulting crowding of bronchovascular markings.  Taking this into consideration, heart and mediastinal contours are stable and within normal limits.  The lung fields appear clear with no signs of focal infiltrate or congestive failure.  No pleural fluid or significant peribronchial cuffing is seen.  Bony structures demonstrate mild degenerative change of the mid thoracic spine and are otherwise intact.  IMPRESSION: Lower lung volumes with an  otherwise stable cardiopulmonary appearance with no new focal or acute abnormality suggested.   Original Report Authenticated By: Rhodia Albright, M.D.   US Renal  06/13/2013   *RADIOLOGY REPORT*  Clinical Data: Acute renal failure  RENAL/URINARY TRACT ULTRASOUND COMPLETE  Comparison:  Abdominal ultrasound 02/22/2013  Findings:  Right Kidney:  Measures 13.0 cm in sagittal length.  Echogenicity of the renal parenchyma appears increased compared to the adjacent liver.  There is mild prominence of the right renal collecting system and calyces consistent with mild hydronephrosis.  Several simple renal cysts are again seen.  The largest cyst is exophytic from the mid pole measuring 4.0 x 4.0 x 4.0 cm.  The second largest cyst is exophytic from the upper pole measuring 2.6 by 2.8 x 2.3 cm.  Left Kidney:  The left kidney measures 11.0 cm in sagittal length. Renal cortex appears slightly echogenic.  There is mild fullness of the left renal collecting system, suggesting mild hydronephrosis. Three small cysts are seen, largest measuring 1.7 x 1.6 x 1.4 cm, extending exophytically from the lower pole.  Bladder:  The urinary bladder is very distended at the time of the imaging.  The sonographer reports that the patient had voided just prior to the examination.  The urinary bladder wall thickness appears normal.  Bladder volume is calculated to be 1239 cm cubed.  A small amount of ascites was noted adjacent to the liver.  IMPRESSION:  1.  Mild  bilateral hydronephrosis and very distended urinary bladder. The fullness of both renal collecting systems could be secondary to the patient's very distended urinary bladder.  The urinary bladder is distended, despite the patient having reportedly voided prior to performance of the ultrasound. Question if the patient could have bladder outlet obstruction. 2.  Slight increased echogenicity of the renal cortices could be due to chronic medical renal disease. 3.  Very small amount of  ascites noted adjacent to the liver. 4.  Bilateral simple renal cysts.   Original Report Authenticated By: Britta Mccreedy, M.D.   Nm Pulmonary Perf And Vent  06/13/2013   *RADIOLOGY REPORT*  Clinical Data: Shortness of breath  NM PULMONARY VENTILATION AND PERFUSION SCAN  Views:  Anterior, posterior, right lateral, left lateral, RPO, LPO, RAO, LAO - ventilation and perfusion  Radiopharmaceutical: Technetium 77m DTPA - ventilation; Technetium 39m macroaggregated albumin - perfusion  Dose:  40.0 mCi - ventilation; 4.8 mCi - perfusion  Route of administration:  Inhalation - ventilation; intravenous - perfusion  Comparison: Chest radiograph June 13, 2013  Findings:  The ventilation study shows homogeneous and symmetric uptake of radiotracer bilaterally.  The perfusion study shows homogeneous and symmetric uptake of radiotracer bilaterally.  There is no appreciable ventilation / perfusion mismatch.  IMPRESSION: Normal studies.  Very low probability of pulmonary embolus.   Original Report Authenticated By: Bretta Bang, M.D.    Microbiology: No results found for this or any previous visit (from the past 240 hour(s)).   Labs: Basic Metabolic Panel:  Recent Labs Lab 06/14/13 0230 06/14/13 0620 06/14/13 0940 06/14/13 2220 06/15/13 0405 06/15/13 0945 06/15/13 2215  NA 125* 129* 132* 131* 134* 133* 132*  K 3.8 3.8 3.6 3.9 3.7 3.3* 3.6  CL 88* 90* 93* 93* 96 93* 93*  CO2 19 20 24 25 27 28 29   GLUCOSE 147* 98 147* 123* 119* 139* 118*  BUN 101* 93* 85* 58* 49* 41* 30*  CREATININE 12.75* 10.37* 8.78* 4.42* 3.55* 2.75* 1.94*  CALCIUM 9.1 9.2 9.4 8.9 8.7 9.1 8.5  PHOS  --  6.1*  --   --   --   --   --    Liver Function Tests:  Recent Labs Lab 06/14/13 0620  ALBUMIN 3.2*   No results found for this basename: LIPASE, AMYLASE,  in the last 168 hours No results found for this basename: AMMONIA,  in the last 168 hours CBC:  Recent Labs Lab 06/13/13 1340 06/13/13 1832 06/14/13 0620  06/15/13 0405  WBC 13.7*  --  9.0 11.7*  NEUTROABS 11.7*  --   --   --   HGB 10.9* 10.2* 10.7* 9.6*  HCT 28.9* 30.0* 28.9* 26.8*  MCV 82.3  --  83.3 85.6  PLT 459*  --  490* 471*   Cardiac Enzymes:  Recent Labs Lab 06/13/13 2043  CKTOTAL 778*   BNP: BNP (last 3 results)  Recent Labs  06/13/13 1609  PROBNP 267.2*   CBG: No results found for this basename: GLUCAP,  in the last 168 hours     Signed:  Marinda Elk  [facility] 06/16/2013, 9:40 AM

## 2013-06-16 NOTE — Progress Notes (Signed)
Patient discharged to home with family, discharge instructions reviewed with patient and family who verbalized understanding. No new RX's. Patient and family instructed in care of foley catheter, supplies given,  demonstrated how to change from regular foley bag to leg bag. Written instructions reviewed with patient and family who verbalized understanding.

## 2013-06-16 NOTE — Progress Notes (Signed)
Nutrition Brief Note  Pt discussed during multidisciplinary rounds. RN contacted RD per wife's request about questions regarding diet at discharge. Pt d/c on heart healthy diet, however noted that pt on renal diet earlier in admission for acute kidney failure. Explained to pt and wife that the renal diet is typically only followed during hospitalization until renal labs normalize, as pt without history of chronic kidney disease. Instructed pt and wife on low phosphorus diet as pt had elevated phosphorus. Encouraged pt to ask his doctor to check his phosphorus level at follow up visit. Handouts on low phosphorus diet provided and explained and RD contact information given. Teach back method used. Expect good compliance.   Levon Hedger MS, RD, LDN [phone] Pager [phone] After Hours Pager

## 2013-06-17 ENCOUNTER — Ambulatory Visit (INDEPENDENT_AMBULATORY_CARE_PROVIDER_SITE_OTHER): Payer: Medicare Other | Admitting: Surgery

## 2013-06-17 ENCOUNTER — Encounter (INDEPENDENT_AMBULATORY_CARE_PROVIDER_SITE_OTHER): Payer: Self-pay | Admitting: Surgery

## 2013-06-17 ENCOUNTER — Telehealth (INDEPENDENT_AMBULATORY_CARE_PROVIDER_SITE_OTHER): Payer: Self-pay

## 2013-06-17 ENCOUNTER — Encounter (INDEPENDENT_AMBULATORY_CARE_PROVIDER_SITE_OTHER): Payer: Medicare Other | Admitting: Surgery

## 2013-06-17 VITALS — BP 146/74 | HR 68 | Resp 16 | Ht 71.0 in | Wt 190.4 lb

## 2013-06-17 DIAGNOSIS — D440 Neoplasm of uncertain behavior of thyroid gland: Secondary | ICD-10-CM

## 2013-06-17 DIAGNOSIS — D449 Neoplasm of uncertain behavior of unspecified endocrine gland: Secondary | ICD-10-CM

## 2013-06-17 DIAGNOSIS — C73 Malignant neoplasm of thyroid gland: Secondary | ICD-10-CM

## 2013-06-17 LAB — UIFE/LIGHT CHAINS/TP QN, 24-HR UR
Alpha 1, Urine: DETECTED — AB
Alpha 2, Urine: DETECTED — AB
Beta, Urine: DETECTED — AB
Free Kappa Lt Chains,Ur: 1.08 mg/dL (ref 0.14–2.42)
Free Kappa/Lambda Ratio: 1.64 ratio — ABNORMAL LOW (ref 2.04–10.37)
Total Protein, Urine: 17.5 mg/dL

## 2013-06-17 NOTE — Telephone Encounter (Signed)
Home health nurse calling about pt's medication. The pt has an appt today with Dr Gerrit Friends. The home health nurse needs clarification on the med's b/c the pt's wife said the orders for the Calcium and Vitamin D changed right before they were discharged. The pt has on discharge instructions to be taking Calcium w/D daily and Vitamin D daily but the pt's wife was given by the nurse on a separate sheet of paper wrote down that the pt is supposed to be taking Calcium D 2 tablets TID and Vitamin D 1 tab BID. Please verify the med's and call the home health nurse to let her know.

## 2013-06-17 NOTE — Telephone Encounter (Signed)
Pt to be seen by Dr Gerrit Friends in office today. I will print this msg and give it to him when he sees pt today.

## 2013-06-17 NOTE — Progress Notes (Signed)
General Surgery [facility]  Visit Diagnoses: 1. Papillary thyroid carcinoma, follicular variant, multifocal   2. Neoplasm of uncertain behavior of thyroid gland, Hurthle cell neoplasm     HISTORY: Patient is a 72 year old male who underwent total thyroidectomy on 06/03/2013. Final pathology shows multifocal follicular variant of papillary thyroid carcinoma. There are at least 5 foci of tumor. The largest tumor measures approximately 2.8 cm in dimension. Surgical margins were negative.  Postoperatively the patient has done well. He did have problems with urinary retention which required hospitalization. Serum calcium levels have remained normal. His most recent level is 8.9.  Patient is scheduled to see endocrinology in consultation in 2 weeks. He will require treatment with adjuvant radioactive iodine. Patient is currently taking Synthroid 100 mcg daily.  EXAM: Cervical incision is healing nicely. Steri-Strips are removed. Mild soft tissue swelling. No sign of infection. No sign of seroma. Voice quality is normal.  IMPRESSION: Status post total thyroidectomy for multifocal papillary thyroid carcinoma  PLAN: Patient will begin applying topical creams to his incision. He will see endocrinology in consultation in 2 weeks.  Patient will return for wound check in 6 weeks.  Velora Heckler, MD, FACS General & Endocrine Surgery [facility]

## 2013-06-17 NOTE — Patient Instructions (Signed)
  COCOA BUTTER & VITAMIN E CREAM  ([employer] or other brand)  Apply cocoa butter/vitamin E cream to your incision 2 - 3 times daily.  Massage cream into incision for one minute with each application.  Use sunscreen (50 SPF or higher) for first 6 months after surgery if area is exposed to sun.  You may substitute Mederma or other scar reducing creams as desired.   

## 2013-06-18 ENCOUNTER — Telehealth: Payer: Self-pay | Admitting: Internal Medicine

## 2013-06-18 NOTE — Telephone Encounter (Signed)
Needs hospital f/u this week, please arrange

## 2013-06-20 NOTE — Telephone Encounter (Signed)
Lmovm for patient to call office. °

## 2013-06-22 ENCOUNTER — Ambulatory Visit (INDEPENDENT_AMBULATORY_CARE_PROVIDER_SITE_OTHER): Payer: Medicare Other | Admitting: Internal Medicine

## 2013-06-22 VITALS — BP 160/80 | HR 74 | Wt 192.4 lb

## 2013-06-22 DIAGNOSIS — I10 Essential (primary) hypertension: Secondary | ICD-10-CM

## 2013-06-22 DIAGNOSIS — N179 Acute kidney failure, unspecified: Secondary | ICD-10-CM

## 2013-06-22 DIAGNOSIS — E010 Iodine-deficiency related diffuse (endemic) goiter: Secondary | ICD-10-CM

## 2013-06-22 DIAGNOSIS — N139 Obstructive and reflux uropathy, unspecified: Secondary | ICD-10-CM

## 2013-06-22 DIAGNOSIS — E049 Nontoxic goiter, unspecified: Secondary | ICD-10-CM

## 2013-06-22 MED ORDER — CARVEDILOL 6.25 MG PO TABS: 6.2500 mg | ORAL_TABLET | Freq: Two times a day (BID) | ORAL | Status: DC

## 2013-06-22 NOTE — Assessment & Plan Note (Signed)
Admitted 06/13/2013 for 3 days: Diagnosed with acute renal failure d/t  Obstructive Uropathy. Urology was consulted, he had a Foley temporarily, was discharged on Flomax and prn self catheterization. Has  a followup scheduled with urology

## 2013-06-22 NOTE — Assessment & Plan Note (Addendum)
Recently admitted to acute renal failure due to obstructive uropathy, Maxzidex was on hold. BP slightly elevated today, only taking amlodipine, No recent ambulatory BPs. Plan: Continue amlodipine Add coreg D/c maxzide, ok to go back on it if needed at some point and  if BMP ok See instructions

## 2013-06-22 NOTE — Progress Notes (Signed)
  Subjective:    Patient ID: [name] Wood, male    DOB: 27-Jan-1941, 72 y.o.   MRN: [id number]  HPI Hospital followup Discharge summary dictation, appropriate labs and x-rays were reviewed and summarized in the assessment and plan  Past Medical History  Diagnosis Date  . Allergy     rhinitis  . Hyperlipidemia   . Hypertension   . Osteoarthritis   . Erectile dysfunction   . Overweight(278.02)   . Renal insufficiency     baseline cr 1.5 11-06; cr 1.3 on 5-02  . Prostate cancer 2-11    prostate  . Prostate ca 2013    seeding implant  . Thyroid goiter 05-31-13    multinodular    Past Surgical History  Procedure Laterality Date  . Tonsillectomy  1967  . Radioactive seed implant  ~2012  . Hernia repair  2009    right  . Thyroidectomy N/A 06/03/2013    Procedure: TOTAL THYROIDECTOMY;  Surgeon: Velora Heckler, MD;  Location: [facility] ORS;  Service: General;  Laterality: N/A;    Review of Systems Since he left the hospital he is doing better, follows all instructions as recommended. Uses bladder catheterization q 4 hours. No chest shortness or breath No lower extremity edema Denies nausea, vomiting, diarrhea. No fever chills.     Objective:   Physical Exam BP 160/80  Pulse 74  Wt 192 lb 6.4 oz (87.272 kg)  BMI 26.85 kg/m2  SpO2 98%  General -- alert, well-developed, NAD.    Lungs -- normal respiratory effort, no intercostal retractions, no accessory muscle use, and normal breath sounds.   Heart-- normal rate, regular rhythm, no murmur, and no gallop.   Extremities-- no pretibial edema bilaterally Neurologic-- alert & oriented X3 and strength normal in all extremities. Psych-- Cognition and judgment appear intact. Alert and cooperative with normal attention span and concentration.  not anxious appearing and not depressed appearing.       Assessment & Plan:  Admitted 06/13/2013 for 3 days: Diagnosed with acute renal failure d/t a obstructive uropathy Sodium was low. He had  shortness or breath, a VQ scan was low probability. Urology was consulted, he had a Foley temporarily, was discharged on Flomax and prn self catheterization. Initial sodium 116, at discharge 136. BUN, creatinine: 110/16 , at discharge 24/1.8. Hemoglobin was low at 9.6 from a baseline of 12.2. TSH wnl

## 2013-06-22 NOTE — Assessment & Plan Note (Signed)
Status post thyroidectomy, pathology show that your thyroid carcinoma multifocal. Plan is to see endocrinology for further eval.

## 2013-06-22 NOTE — Telephone Encounter (Signed)
Pt scheduled for 06/24/13.

## 2013-06-22 NOTE — Assessment & Plan Note (Addendum)
Recent admission for acute kidney failure reviewed, see below. Plan: Recheck a BMP and a CBC. ---- Admitted 06/13/2013 for 3 days: Diagnosed with acute renal failure d/t a obstructive uropathy. Sodium was low.  Urology was consulted, he had a Foley temporarily, was discharged on Flomax and prn self catheterization. Initial sodium 116, at discharge 136. BUN, creatinine: 110/16 , at discharge 24/1.8. Hemoglobin was low at 9.6 from a baseline of 12.2.

## 2013-06-22 NOTE — Patient Instructions (Addendum)
Stop the diuretic Continue amlodipine, add carvedilol twice a day. Check the  blood pressure 2 or 3 times a week, be sure it is between 110/60 and 140/80. If it is consistently higher or lower, let me know; we may need to adjust the new medication or go back to the diuretic. If you feel well and the blood pressure is normal, next visit in 3 months.

## 2013-06-23 ENCOUNTER — Encounter: Payer: Self-pay | Admitting: Internal Medicine

## 2013-06-23 LAB — CBC WITH DIFFERENTIAL/PLATELET
Basophils Relative: 0.4 % (ref 0.0–3.0)
Eosinophils Relative: 3.6 % (ref 0.0–5.0)
MCV: 94.6 fl (ref 78.0–100.0)
Monocytes Absolute: 0.3 10*3/uL (ref 0.1–1.0)
Monocytes Relative: 3.4 % (ref 3.0–12.0)
Neutrophils Relative %: 78.3 % — ABNORMAL HIGH (ref 43.0–77.0)
RBC: 3.25 Mil/uL — ABNORMAL LOW (ref 4.22–5.81)
WBC: 9.4 10*3/uL (ref 4.5–10.5)

## 2013-06-23 LAB — BASIC METABOLIC PANEL
Chloride: 102 mEq/L (ref 96–112)
Creatinine, Ser: 1.4 mg/dL (ref 0.4–1.5)
GFR: 63.91 mL/min (ref >60.00)
Potassium: 3.6 mEq/L (ref 3.5–5.1)

## 2013-06-24 ENCOUNTER — Ambulatory Visit: Payer: Medicare Other | Admitting: Internal Medicine

## 2013-06-27 ENCOUNTER — Telehealth: Payer: Self-pay | Admitting: Internal Medicine

## 2013-06-27 NOTE — Telephone Encounter (Signed)
If he is unable to sleep because he is urinating frequently, he needs to discuss that with his urologist.

## 2013-06-27 NOTE — Telephone Encounter (Signed)
Patient's wife is calling and states that the patient is unable to fall asleep at night and gets up too many times at night to urinate. She wants to know if a prescription can be called in to [facility] in [place] to help him sleep.

## 2013-06-27 NOTE — Telephone Encounter (Signed)
Please advise or if pt. Needs to schedule OV. Thanks.

## 2013-06-28 NOTE — Telephone Encounter (Signed)
Spoke with patients wife and advised of recommendations. She will call urology.

## 2013-06-30 ENCOUNTER — Encounter: Payer: Self-pay | Admitting: Internal Medicine

## 2013-06-30 ENCOUNTER — Telehealth: Payer: Self-pay | Admitting: *Deleted

## 2013-06-30 ENCOUNTER — Ambulatory Visit (INDEPENDENT_AMBULATORY_CARE_PROVIDER_SITE_OTHER): Payer: Medicare Other | Admitting: Internal Medicine

## 2013-06-30 VITALS — BP 138/70 | HR 77 | Temp 98.8°F | Resp 10 | Ht 71.0 in | Wt 190.0 lb

## 2013-06-30 DIAGNOSIS — C73 Malignant neoplasm of thyroid gland: Secondary | ICD-10-CM

## 2013-06-30 LAB — TSH: TSH: 13.6 u[IU]/mL — ABNORMAL HIGH (ref 0.35–5.50)

## 2013-06-30 LAB — T4, FREE: Free T4: 1.1 ng/dL (ref 0.60–1.60)

## 2013-06-30 MED ORDER — LEVOTHYROXINE SODIUM 112 MCG PO TABS: 112.0000 ug | ORAL_TABLET | Freq: Every day | ORAL | Status: DC

## 2013-06-30 NOTE — Telephone Encounter (Signed)
Called pt and advised him of his results from his lab work. TSH was 13.60, Free, T4 was 1.10. Advised pt per Dr Elvera Lennox that he needs to increase his Synthroid to 112 mcg daily; take it EVERY DAY, with water, >30 min before b'fast, separated by >4h from anti acid medication, calcium, iron, MVI. I will send it to his pharmacy. Will need to come back in 6 weeks for labs. She is putting in the orders. Also advised pt of his RAi appts: Wed, Aug 13th at 8:45 am, Thurs, Aug 14th at 8:45 am and Fri, Aug 15th at 8:45 am. Pt also to return for a Whole Body Scan on Fri, Aug 22nd at 7:45 am. Pt understood. To send letter to pt. with schedule.

## 2013-06-30 NOTE — Progress Notes (Addendum)
Subjective:     Patient ID: [name] Wood, male   DOB: 13-May-1941, 72 y.o.   MRN: [id number]  HPI [name] Wood is a pleasant 72 year old man, referred by Dr.Gerkin for management of papillary thyroid cancer, follicular variant, multifocal.  ThyCa Hx: 01/2013 pt. found to have a goiter on exam with PCP (no previous sxs) 03/01/2013: thyroid ultrasound: Bilateral nodules, with a dominant nodule in the inferior left lobe of 4.9 x 2.4 x 3.1 cm, and upper left lobe of 2.6 x 1.7 x 2.5 cm, with calcifications. On the right, there are several nodules, 3.2 x 2.3 x 2.9 cm, 3.2 x 2.1 x 2.9 cm, and 2 cm in diameter, and additional smaller nodules, less than 1.4 cm. 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 Gerrit Friends: at least 5 bilateral foci of papillary thyroid cancer, follicular variant, with the largest being 2.8 cm.  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 06/17/2013: postsurgical visit with Dr. Gerrit Friends. calcium 8.9.   Patient felt very weak after his surgery, and was subsequently admitted with hyponatremia (sodium 117), and found to have urinary retention/incontinence - had obstructive uropathy. This has since improved, but still needs to cath himself.  He is on Synthroid 100 mcg daily. He takes it in am, with water, eats b'fast 2 hours, takes the calcium with b'fast. He is on Coral calcium 1000 mg, also vit D 1000 units.   Denies weight changes, has cold>hot intolerance, nocturia - not new, no anxiety, palpitations, tremors.   Patient has family history of thyroid disease in daughter. No  family history of thyroid cancer.  I reviewed his chart, and patient also has a history of hypertension, hyperlipidemia, erectile dysfunction, renal insufficiency, osteoarthritis, history of prostate cancer - implanted radioactive seeds, obstructive uropathy.  Review of Systems Constitutional: no weight gain/loss, no fatigue, + subjective hyperthermia/hypothermia (most frequently), + nocturia >1, poor sleep Eyes: no blurry vision, no xerophthalmia ENT: no sore throat, no nodules palpated in throat, no dysphagia/odynophagia, no hoarseness Cardiovascular: no CP/SOB/palpitations/leg swelling Respiratory: no cough/SOB Gastrointestinal: no N/V/D/C Musculoskeletal: no muscle/joint aches Skin: no rashes Neurological: no tremors/numbness/tingling/dizziness Psychiatric: no depression/anxiety Difficulty with erections  Past Medical History  Diagnosis Date  . Allergy     rhinitis  . Hyperlipidemia   . Hypertension   . Osteoarthritis   . Erectile dysfunction   . Overweight(278.02)   . Renal insufficiency     baseline cr 1.5 11-06; cr 1.3 on 5-02  . Prostate cancer 2-11    prostate  . Prostate ca 2013    seeding implant  . 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 Tonsillectomy  1967  . Radioactive seed implant  ~2012  . Hernia repair  2009    right  . Thyroidectomy N/A 06/03/2013    Procedure: TOTAL THYROIDECTOMY;  Surgeon: Velora Heckler, MD;  Location: [facility];  Service: General;  Laterality: N/A;   History   Social History  . Marital Status: Married    Spouse Name: N/A  Number of Children: 4   Occupational History  . [employer]    Social History Main Topics  . Smoking status: Never Smoker   . Smokeless tobacco: Never Used  . Alcohol Use: No     Comment: very rarely   . Drug Use: No  . Sexually Active: Yes -- Male partner(s)   Social History Narrative   Diet: healthy   Regular exercise: bowl, walk    Caffeine use: no    Current Outpatient Prescriptions on File Prior to Visit  Medication Sig Dispense Refill  . amLODipine (NORVASC) 10 MG tablet Take 10 mg by mouth every evening.      . Calcium Carbonate-Vitamin D (CALCIUM-VITAMIN D) 500-200 MG-UNIT per tablet Take 1 tablet by mouth 2 (two) times daily with a meal.      . carvedilol (COREG) 6.25 MG tablet Take 1 tablet (6.25 mg total) by mouth 2 (two) times daily with a meal.  60 tablet  3  . cholecalciferol (VITAMIN D) 1000 UNITS tablet Take 1,000 Units by mouth daily.      . fexofenadine (ALLEGRA) 180 MG tablet Take 180 mg by mouth daily as needed (allergies).       Marland Kitchen HYDROcodone-acetaminophen (NORCO/VICODIN) 5-325 MG per tablet Take 1-2 tablets by mouth every 4 (four) hours as needed for pain.      Marland Kitchen levothyroxine (SYNTHROID, LEVOTHROID) 100 MCG tablet Take 1 tablet (100 mcg total) by mouth daily before breakfast.  90 tablet  1  . mometasone (NASONEX) 50 MCG/ACT nasal spray Place 2 sprays into the nose daily as needed (rhinitis).       . multivitamin (ONE-A-DAY MEN'S) TABS Take 1 tablet by mouth daily.        . polyvinyl alcohol (LIQUIFILM TEARS) 1.4 % ophthalmic solution Place 1 drop into both eyes daily as needed (for allergies).      . simvastatin (ZOCOR) 20 MG tablet Take 20 mg by mouth at bedtime.      . tamsulosin (FLOMAX) 0.4 MG CAPS Take 0.4 mg by mouth daily.        No current facility-administered medications on file prior to visit.   No Known Allergies  Family History  Problem Relation Age of Onset  . Alzheimer's disease Mother   . Diabetes Father   . Kidney disease Father   . Cancer Sister     ? type  . Colon cancer Neg Hx   . Prostate cancer Neg Hx   . CAD Neg Hx    Objective:   Physical Exam BP 138/70  Pulse 77  Temp(Src) 98.8 F (37.1 C) (Oral)  Resp 10  Ht 5\' 11"  (1.803 m)  Wt 190 lb (86.183 kg)  BMI 26.51 kg/m2  SpO2 98% Wt Readings from Last 3 Encounters:  06/30/13 190 lb (86.183 kg)  06/22/13 192 lb  6.4 oz (87.272 kg)  06/17/13 190 lb 6.4 oz (86.365 kg)   Constitutional: normal weight, in NAD Eyes: PERRLA, EOMI, no exophthalmos ENT: moist mucous membranes, no thyromegaly, thyroidectomy scar healing - no pain or increased sensitivity to touch; no cervical lymphadenopathy. [place] negative bilat. Cardiovascular: RRR, No MRG Respiratory: CTA B Gastrointestinal: abdomen soft, NT, ND, BS+ Musculoskeletal: no deformities, strength intact in all 4 Skin: moist, warm, no rashes Neurological: no tremor with outstretched hands, DTR normal in all 4  Assessment:     1. Papillary thyroid cancer, follicular variant, multifocal  - see history of present illness    Plan:     Patient  with recently diagnosed multifocal papillary thyroid cancer, without extension to the neck, and apparently without lymphovascular invasion. - I had a long discussion with the patient and his wife about the fact that papillary thyroid cancer is an indolent cancer, with low chances of recurrence, after total thyroidectomy and radioactive iodine treatment - We discussed about the next step in his treatment, which is RAI thyroid remnant ablation, which is ideally done by thyroid hormone withdrawal, however, due to age and comorbidities, I think he would do better with Thyrogen stimulation - I explained the purpose of radioactive iodine treatment, which is mostly to improve followup for the cancer - Patient and wife agree with the plan, however, he needs to have all the studies done by Aug 25th when he gets back to work as he is a Optician, dispensing and cannot miss work - also, since he had a bad experience with urinary retention after his thyroidectomy, he wants to have everything ran by Dr Brunilda Payor (will send Dr Brunilda Payor my note). We did discuss about possible side effects of radioactive iodine treatment, which, however, is very unlikely to affect his urinary bladder. - We discussed about proper intake of Synthroid. Advised him to move calcium  from b'fast to dinnertime. - I will check a TSH and a free T4 today - Advise him to join my chart for easier communication  Office Visit on 06/30/2013  Component Date Value Range Status  . TSH 06/30/2013 13.60* 0.35 - 5.50 uIU/mL Final  . Free T4 06/30/2013 1.10  0.60 - 1.60 ng/dL Final   Will increase Synthroid to 112 mcg daily. RTC in 6 weeks for labs.  I called the Nuclear Medicine Department and discussed with Dr. Pecolia Ades, suggested: no need for pretreatment scan, okay to go ahead with Thyrogen stimulated remnant ablation, proceed with ~75 mCi radioactive iodine, and perform posttreatment whole-body scan, if possible, however might skip this step if this can only be scheduled after patient returns to work.  We were able to schedule all tests before 08/25 >> will call pt with the whole schedule: - Wednesday, 07/06/2013, 8:45 AM, Thyrogen injection #1 - Thursday, 07/07/2013, 8:45 AM, Thyrogen injection #2 - Friday, 07/08/2013, 8:45 AM, I-131 tablet   Radioactive iodine ablation - Friday 07/15/2013, 7:45 AM, post treatment whole-body scan  07/18/2013:  *RADIOLOGY REPORT* Clinical Data: 7 days post therapy for thyroid carcinoma: Papillary thyroid carcinoma with follicular variant NUCLEAR MEDICINE I-131 POST THERAPY WHOLE BODY SCAN Technique: Standard anterior and posterior localized whole body planar images obtained 5 to 10 days following therapeutic dose of I- 131. Views: Whole body Radionuclide: Sodium of 131 Dose: 77 mCi administered 7 days prior Route of administration: Oral Comparison: None.  Findings: There is a residual uptake seen in the region of the thyroid. No appreciable radiotracer uptake is noted outside of the neck region.  IMPRESSION: No evidence suggesting metastatic disease outside of the thyroid region. There is uptake in the thyroid region indicating residual thyroid tissue in the neck.  Original Report Authenticated By: Bretta Bang, M.D.

## 2013-06-30 NOTE — Patient Instructions (Signed)
Please stop at the lab. Please join MyChart for easier communication. I will try to arrange the radioactive iodine (RAI) treatment and whole body scan by 08/25, but I cannot guarantee we can do it. Start the low iodine diet at least 2 weeks before the RAI treatment day.

## 2013-07-05 ENCOUNTER — Other Ambulatory Visit: Payer: Self-pay | Admitting: Internal Medicine

## 2013-07-05 NOTE — Telephone Encounter (Signed)
Refill done per protocol.  

## 2013-07-06 ENCOUNTER — Encounter (HOSPITAL_COMMUNITY)
Admission: RE | Admit: 2013-07-06 | Discharge: 2013-07-06 | Disposition: A | Discharge status: Home / Self Care | Payer: Medicare Other | Source: Ambulatory Visit | Attending: Internal Medicine | Admitting: Internal Medicine

## 2013-07-06 DIAGNOSIS — C73 Malignant neoplasm of thyroid gland: Secondary | ICD-10-CM | POA: Insufficient documentation

## 2013-07-06 MED ORDER — THYROTROPIN ALFA 1.1 MG IM SOLR
0.9000 mg | INTRAMUSCULAR | Status: AC
  Administered 2013-07-06: 0.9 mg via INTRAMUSCULAR
  Filled 2013-07-06: qty 0.9

## 2013-07-07 ENCOUNTER — Encounter (HOSPITAL_COMMUNITY)
Admission: RE | Admit: 2013-07-07 | Discharge: 2013-07-07 | Disposition: A | Discharge status: Home / Self Care | Payer: Medicare Other | Source: Ambulatory Visit | Attending: Internal Medicine | Admitting: Internal Medicine

## 2013-07-07 MED ORDER — THYROTROPIN ALFA 1.1 MG IM SOLR
0.9000 mg | INTRAMUSCULAR | Status: AC
Start: 2013-07-07 — End: 2013-07-07
  Administered 2013-07-07: 0.9 mg via INTRAMUSCULAR
  Filled 2013-07-07: qty 0.9

## 2013-07-08 ENCOUNTER — Encounter (HOSPITAL_COMMUNITY)
Admission: RE | Admit: 2013-07-08 | Discharge: 2013-07-08 | Disposition: A | Discharge status: Home / Self Care | Payer: Medicare Other | Source: Ambulatory Visit | Attending: Internal Medicine | Admitting: Internal Medicine

## 2013-07-08 ENCOUNTER — Telehealth: Payer: Self-pay | Admitting: *Deleted

## 2013-07-08 MED ORDER — SODIUM IODIDE I 131 CAPSULE: 77.0000 | Freq: Once | INTRAVENOUS | Status: AC | PRN

## 2013-07-08 NOTE — Telephone Encounter (Signed)
Called pt's wife and advised her per Dr Elvera Lennox just 2 more days if he had the RAi tx today. She understood.

## 2013-07-08 NOTE — Telephone Encounter (Signed)
Pt's wife called asking how long does pt need to stay on a low iodine diet? Please advise. Thank you.

## 2013-07-08 NOTE — Telephone Encounter (Signed)
Just 2 more days if he had the RAI tx today.

## 2013-07-15 ENCOUNTER — Encounter (HOSPITAL_COMMUNITY)
Admission: RE | Admit: 2013-07-15 | Discharge: 2013-07-15 | Disposition: A | Discharge status: Home / Self Care | Payer: Medicare Other | Source: Ambulatory Visit | Attending: Internal Medicine | Admitting: Internal Medicine

## 2013-07-15 DIAGNOSIS — C73 Malignant neoplasm of thyroid gland: Secondary | ICD-10-CM | POA: Insufficient documentation

## 2013-07-18 ENCOUNTER — Encounter: Payer: Self-pay | Admitting: Internal Medicine

## 2013-07-26 ENCOUNTER — Encounter (INDEPENDENT_AMBULATORY_CARE_PROVIDER_SITE_OTHER): Payer: Medicare Other | Admitting: Surgery

## 2013-08-01 ENCOUNTER — Telehealth: Payer: Self-pay | Admitting: Internal Medicine

## 2013-08-01 NOTE — Telephone Encounter (Signed)
If he has a burning pain at the area where he received radiation therapy that is likely related. If he has any redness, swelling, warmness needs to be seen. I can't say much more without seeing him. Schedule a visit if so desired.

## 2013-08-01 NOTE — Telephone Encounter (Signed)
Please advise 

## 2013-08-01 NOTE — Telephone Encounter (Signed)
Patient's wife called stating the patient has been having pain in his "private area" since completing radiation. She states their oncologist told them its from the radiation, but she would like to know what Dr. Drue Novel thinks. CB# [phone]

## 2013-08-02 NOTE — Telephone Encounter (Signed)
Spoke with patient's wife and advised of recommendations per documentation.

## 2013-08-15 NOTE — Telephone Encounter (Signed)
Spoke with pt's wife. Stated she spoke with someone at [facility] last night and they stated pt had a infection in his prostate. Advised pt's wife that if he is bleeding from his penis that he needs to be seen by the ED, as soon as possible to be evaluated.

## 2013-08-30 ENCOUNTER — Ambulatory Visit (INDEPENDENT_AMBULATORY_CARE_PROVIDER_SITE_OTHER): Payer: Medicare Other | Admitting: Internal Medicine

## 2013-08-30 ENCOUNTER — Encounter: Payer: Self-pay | Admitting: Internal Medicine

## 2013-08-30 VITALS — BP 148/73 | HR 89 | Temp 97.4°F | Wt 192.8 lb

## 2013-08-30 DIAGNOSIS — M1990 Unspecified osteoarthritis, unspecified site: Secondary | ICD-10-CM

## 2013-08-30 DIAGNOSIS — E785 Hyperlipidemia, unspecified: Secondary | ICD-10-CM

## 2013-08-30 DIAGNOSIS — C61 Malignant neoplasm of prostate: Secondary | ICD-10-CM

## 2013-08-30 DIAGNOSIS — I10 Essential (primary) hypertension: Secondary | ICD-10-CM

## 2013-08-30 LAB — BASIC METABOLIC PANEL
CO2: 30 mEq/L (ref 19–32)
Chloride: 106 mEq/L (ref 96–112)
Creatinine, Ser: 1.4 mg/dL (ref 0.4–1.5)
Potassium: 3.4 mEq/L — ABNORMAL LOW (ref 3.5–5.1)
Sodium: 142 mEq/L (ref 135–145)

## 2013-08-30 LAB — AST: AST: 26 U/L (ref 0–37)

## 2013-08-30 LAB — ALT: ALT: 22 U/L (ref 0–53)

## 2013-08-30 MED ORDER — CARVEDILOL 12.5 MG PO TABS: 12.5000 mg | ORAL_TABLET | Freq: Two times a day (BID) | ORAL | Status: DC

## 2013-08-30 NOTE — Assessment & Plan Note (Addendum)
Ambulatory BPs around 140-150/70. Continue with edema. Currently on amlodipine 10 mg, coreg 6.25 bid Plan: BMP Discontinue amlodipine due to edema which is not severe but bothersome to pt Increase coreg dose If BP   elevated, consider reintroduce a diuretic Asked patient to call w/ BP readings in a month

## 2013-08-30 NOTE — Assessment & Plan Note (Signed)
LFTs 

## 2013-08-30 NOTE — Assessment & Plan Note (Signed)
Strongly encouraged to avoid ibuprofen, Tylenol is okay

## 2013-08-30 NOTE — Progress Notes (Signed)
  Subjective:    Patient ID: [name] Wood, male    DOB: [birth year], 72 y.o.   MRN: [id number]  HPI Followup from previous visit Hypertension, good medication compliance, still has some lower extremity edema and is concerned about it. Ambulatory BPs 140, 154, diastolic BP in the 70s. Was recommended to take ibuprofen by another provider, strongly recommend him not to. See assessment and plan.  Past Medical History  Diagnosis Date  . Allergy     rhinitis  . Hyperlipidemia   . Hypertension   . Osteoarthritis   . Erectile dysfunction   . Overweight(278.02)   . Renal insufficiency     baseline cr 1.5 11-06; cr 1.3 on 5-02  . Prostate cancer 2-11    prostate  . Prostate ca 2013    seeding implant  . Thyroid goiter 05-31-13    multinodular    Past Surgical History  Procedure Laterality Date  . Tonsillectomy  1967  . Radioactive seed implant  ~2012  . Hernia repair  2009    right  . Thyroidectomy N/A 06/03/2013    Procedure: TOTAL THYROIDECTOMY;  Surgeon: Velora Heckler, MD;  Location: [facility];  Service: General;  Laterality: N/A;   History   Social History  . Marital Status: Married    Spouse Name: N/A    Number of Children: 4  . Years of Education: N/A   Occupational History  . [employer]    Social History Main Topics  . Smoking status: Never Smoker   . Smokeless tobacco: Never Used  . Alcohol Use: No     Comment: very rarely   . Drug Use: No  . Sexual Activity: Yes    Partners: Female   Other Topics Concern  . Not on file   Social History Narrative  . No narrative on file    Review of Systems Follows a low-salt diet Urinary flow is normal, no dysuria or gross hematuria, very seldom use self bladder catheterization.     Objective:   Physical Exam BP 148/73  Pulse 89  Temp(Src) 97.4 F (36.3 C)  Wt 192 lb 12.8 oz (87.454 kg)  BMI 26.9 kg/m2  SpO2 98% General -- alert, well-developed, NAD.   Lungs -- normal respiratory effort, no intercostal  retractions, no accessory muscle use, and normal breath sounds.  Heart-- normal rate, regular rhythm, no murmur.   Extremities-- + to ++/+++ pitting edema , L>R ankle, goes up to mid pretibial area  Neurologic--  alert & oriented X3. Speech normal, gait normal, strength normal in all extremities.  Psych-- Cognition and judgment appear intact. Cooperative with normal attention span and concentration. No anxious appearing , no depressed appearing.         Assessment & Plan:

## 2013-08-30 NOTE — Patient Instructions (Signed)
Stop  amlodipine Continue with coreg at a higher dose !2.5 twice a day Check the  blood pressure 2 or 3 times a week, be sure it is between 110/60 and 140/85. If it is consistently higher or lower, let me know Call with reading in 4 weeks

## 2013-08-30 NOTE — Assessment & Plan Note (Signed)
Reports good urinary flow, uses self-catheterization as needed

## 2013-09-29 ENCOUNTER — Ambulatory Visit (INDEPENDENT_AMBULATORY_CARE_PROVIDER_SITE_OTHER): Payer: Medicare Other | Admitting: Internal Medicine

## 2013-09-29 ENCOUNTER — Encounter: Payer: Self-pay | Admitting: Internal Medicine

## 2013-09-29 VITALS — BP 140/80 | HR 61 | Temp 98.0°F | Resp 10 | Wt 191.0 lb

## 2013-09-29 DIAGNOSIS — E890 Postprocedural hypothyroidism: Secondary | ICD-10-CM

## 2013-09-29 DIAGNOSIS — C73 Malignant neoplasm of thyroid gland: Secondary | ICD-10-CM

## 2013-09-29 LAB — T4, FREE: Free T4: 0.96 ng/dL (ref 0.60–1.60)

## 2013-09-29 LAB — TSH: TSH: 12.12 u[IU]/mL — ABNORMAL HIGH (ref 0.35–5.50)

## 2013-09-29 NOTE — Progress Notes (Signed)
Subjective:     Patient ID: [name] Wood, male   DOB: July 29, 1941, 72 y.o.   MRN: [id number]  HPI [name] Wood is a pleasant 72 y.o. man, returning for f/u for papillary thyroid cancer, follicular variant, multifocal and postsurgical hypothyroidism.Marland Kitchen  He is on Synthroid 112 (increased after last visit) mcg daily. He takes it in am, with water, eats b'fast after 2 hours,still takes the calcium with b'fast. He is on Coral calcium 1000 mg, also vit D 1000 units.  Reviewed recent TFTs: Lab Results  Component Value Date   TSH 13.60* 06/30/2013   TSH 0.82 02/10/2013   TSH 0.93 10/16/2009   FREET4 1.10 06/30/2013   ThyCa Hx: 01/2013 pt. found to have a goiter on exam with PCP (no previous sxs) 03/01/2013: thyroid ultrasound: Bilateral nodules, with a dominant nodule in the inferior left lobe of 4.9 x 2.4 x 3.1 cm, and upper left lobe of 2.6 x 1.7 x 2.5 cm, with calcifications. On the right, there are several nodules, 3.2 x 2.3 x 2.9 cm, 3.2 x 2.1 x 2.9 cm, and 2 cm in diameter, and additional smaller nodules, less than 1.4 cm. 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 Gerrit Friends: at least 5 bilateral foci of papillary thyroid cancer, follicular variant, with the largest being 2.8 cm.  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 06/17/2013: postsurgical visit with Dr. Gerrit Friends. calcium 8.9.  06/30/2013: TSH 13.6, fT4 1.10 >> Synthroid increased to 112 mcg, advised to separate more from calcium, but did not do this 07/08/2013: RAI tx 73 mCi post thyrogen injection 2/2 age and  comorbidities 07/15/2013: post RAI WBS: only uptake in low neck   Denies weight changes, has cold>hot intolerance, no anxiety, palpitations, tremors.   Patient also has a history of hypertension, hyperlipidemia, erectile dysfunction, renal insufficiency, osteoarthritis, history of prostate cancer - implanted radioactive seeds, obstructive uropathy.  Review of Systems Constitutional: no weight gain/loss, no fatigue, no subjective hyperthermia/hypothermia, + nocturia >1, poor sleep Eyes: no blurry vision, no xerophthalmia ENT: no sore throat, no nodules palpated in throat, no dysphagia/odynophagia, no hoarseness Cardiovascular: no CP/SOB/palpitations/leg swelling Respiratory: no cough/SOB Gastrointestinal: no N/V/D/C Musculoskeletal: + muscle aches /no joint aches Skin: no rashes Neurological: no tremors/numbness/tingling/dizziness Difficulty with erections, low libido  I reviewed pt's medications, allergies, PMH, social hx, family hx and no changes required, except as mentioned above.  Objective:   Physical Exam BP 140/80  Pulse 61  Temp(Src) 98 F (36.7 C) (Oral)  Resp 10  Wt 191 lb (86.637 kg)  SpO2 96% Wt Readings from Last 3 Encounters:  09/29/13 191 lb (86.637 kg)  08/30/13 192 lb 12.8 oz (87.454 kg)  06/30/13 190 lb (86.183 kg)   Constitutional: normal weight, in NAD Eyes: PERRLA, EOMI, no exophthalmos ENT: moist mucous membranes, thyroidectomy scar healed - no pain or increased sensitivity to touch; no cervical lymphadenopathy. Cardiovascular: RRR, No MRG Respiratory: CTA B Gastrointestinal: abdomen soft, NT, ND, BS+ Musculoskeletal: no deformities, strength intact in all 4 Skin: moist, warm, no rashes Neurological: no tremor with outstretched hands, DTR  normal in all 4  Assessment:     1. Papillary thyroid cancer, follicular variant, multifocal  - see history of present illness    Plan:     Patient with recently diagnosed multifocal papillary thyroid cancer,  without extension to the neck, and apparently without lymphovascular invasion. He is s/p RAI ablation post thyrogen and had a negative WBS (only uptake in neck). - we discussed about his recent WBS and the fact that it did not show metastases - We discussed about proper intake of Synthroid. Advised him to move calcium from b'fast to dinnertime. - I will check a TSH and a free along with Thyroglobulin and ATA today - Advise him to join my chart for easier communication - RTC in 6 mo  Office Visit on 09/29/2013  Component Date Value Range Status  . TSH 09/29/2013 12.12* 0.35 - 5.50 uIU/mL Final  . Free T4 09/29/2013 0.96  0.60 - 1.60 ng/dL Final   Separate Synthroid from Calcium >> also increase dose to 125 mcg.  Office Visit on 09/29/2013  Component Date Value Range Status  . TSH 09/29/2013 12.12* 0.35 - 5.50 uIU/mL Final  . Free T4 09/29/2013 0.96  0.60 - 1.60 ng/dL Final  . Thyroglobulin Ab 09/29/2013 <20.0  <40.0 U/mL Final  . Thyroglobulin 09/29/2013 6.3  0.0 - 55.0 ng/mL Final   Comment:                            Thyroglobulin Autoantibodies may interfere with assays for                          Thyroglobulin and may cause underestimation of the Thyroglobulin                          level.  If Thyroglobulin is ordered alone, please interpret with                          caution.   Continue with above plan.

## 2013-09-29 NOTE — Patient Instructions (Signed)
Please return in 6 months. Please try to join MyChart. I will send you the labs through there.

## 2013-09-30 DIAGNOSIS — E890 Postprocedural hypothyroidism: Secondary | ICD-10-CM | POA: Insufficient documentation

## 2013-09-30 LAB — THYROGLOBULIN ANTIBODY: Thyroglobulin Ab: <20 U/mL (ref <40.0)

## 2013-09-30 LAB — THYROGLOBULIN LEVEL: Thyroglobulin: 6.3 ng/mL (ref 0.0–55.0)

## 2013-09-30 MED ORDER — LEVOTHYROXINE SODIUM 125 MCG PO TABS: 125.0000 ug | ORAL_TABLET | Freq: Every day | ORAL | Status: DC

## 2013-10-18 ENCOUNTER — Telehealth: Payer: Self-pay | Admitting: Internal Medicine

## 2013-10-18 DIAGNOSIS — I10 Essential (primary) hypertension: Secondary | ICD-10-CM

## 2013-10-18 NOTE — Telephone Encounter (Signed)
Patient: [name] Wood ambulatory BPs: 160, 170/70, 80. Pulse 50, 60s Currently on Coreg 12.5 twice a day. Plan:  Reintroduced diuretics, call hydrochlorothiazide 25 mg one by mouth daily #30 no refills Arrange for a BMP in 2 weeks, DX hypertension. Patient to call with BP readings in a month

## 2013-10-19 MED ORDER — HYDROCHLOROTHIAZIDE 25 MG PO TABS: 25.0000 mg | ORAL_TABLET | Freq: Every day | ORAL | Status: DC

## 2013-10-19 NOTE — Telephone Encounter (Signed)
Done. DJR  

## 2013-11-02 ENCOUNTER — Other Ambulatory Visit (INDEPENDENT_AMBULATORY_CARE_PROVIDER_SITE_OTHER): Payer: Medicare Other

## 2013-11-02 DIAGNOSIS — C73 Malignant neoplasm of thyroid gland: Secondary | ICD-10-CM

## 2013-11-02 DIAGNOSIS — E890 Postprocedural hypothyroidism: Secondary | ICD-10-CM

## 2013-11-03 ENCOUNTER — Other Ambulatory Visit: Payer: Self-pay | Admitting: Internal Medicine

## 2013-11-03 DIAGNOSIS — E890 Postprocedural hypothyroidism: Secondary | ICD-10-CM

## 2013-11-03 MED ORDER — LEVOTHYROXINE SODIUM 150 MCG PO TABS: 150.0000 ug | ORAL_TABLET | Freq: Every day | ORAL | Status: DC

## 2013-11-13 ENCOUNTER — Other Ambulatory Visit: Payer: Self-pay | Admitting: Internal Medicine

## 2013-12-13 ENCOUNTER — Other Ambulatory Visit: Payer: Self-pay | Admitting: Internal Medicine

## 2013-12-30 ENCOUNTER — Other Ambulatory Visit: Payer: Self-pay | Admitting: *Deleted

## 2013-12-30 ENCOUNTER — Telehealth: Payer: Self-pay | Admitting: *Deleted

## 2013-12-30 MED ORDER — SIMVASTATIN 20 MG PO TABS: ORAL_TABLET | ORAL | Status: DC

## 2013-12-30 NOTE — Telephone Encounter (Signed)
Patient called and requested a refill for simvastatin (ZOCOR) 20 MG tablet   Pharmacy [facility] [place], [place].

## 2013-12-30 NOTE — Telephone Encounter (Signed)
Done. JG//CMA 

## 2014-01-13 ENCOUNTER — Telehealth: Payer: Self-pay

## 2014-01-13 ENCOUNTER — Encounter: Payer: Self-pay | Admitting: Internal Medicine

## 2014-01-13 ENCOUNTER — Ambulatory Visit (INDEPENDENT_AMBULATORY_CARE_PROVIDER_SITE_OTHER): Payer: Medicare HMO | Admitting: Internal Medicine

## 2014-01-13 VITALS — BP 187/81 | HR 76 | Temp 97.9°F | Wt 194.0 lb

## 2014-01-13 DIAGNOSIS — I10 Essential (primary) hypertension: Secondary | ICD-10-CM

## 2014-01-13 LAB — CBC WITH DIFFERENTIAL/PLATELET
BASOS ABS: 0 10*3/uL (ref 0.0–0.1)
Basophils Relative: 0.5 % (ref 0.0–3.0)
EOS ABS: 0.2 10*3/uL (ref 0.0–0.7)
Eosinophils Relative: 4 % (ref 0.0–5.0)
HCT: 38.2 % — ABNORMAL LOW (ref 39.0–52.0)
Hemoglobin: 12.4 g/dL — ABNORMAL LOW (ref 13.0–17.0)
LYMPHS PCT: 20 % (ref 12.0–46.0)
Lymphs Abs: 1.2 10*3/uL (ref 0.7–4.0)
MCHC: 32.5 g/dL (ref 30.0–36.0)
MCV: 96 fl (ref 78.0–100.0)
MONO ABS: 0.7 10*3/uL (ref 0.1–1.0)
Monocytes Relative: 10.7 % (ref 3.0–12.0)
NEUTROS PCT: 64.8 % (ref 43.0–77.0)
Neutro Abs: 4 10*3/uL (ref 1.4–7.7)
Platelets: 309 10*3/uL (ref 150.0–400.0)
RBC: 3.98 Mil/uL — ABNORMAL LOW (ref 4.22–5.81)
RDW: 13.4 % (ref 11.5–14.6)
WBC: 6.2 10*3/uL (ref 4.5–10.5)

## 2014-01-13 LAB — BASIC METABOLIC PANEL
BUN: 13 mg/dL (ref 6–23)
CALCIUM: 9 mg/dL (ref 8.4–10.5)
CO2: 31 meq/L (ref 19–32)
CREATININE: 1.3 mg/dL (ref 0.4–1.5)
Chloride: 100 mEq/L (ref 96–112)
GFR: 71.4 mL/min (ref >60.00)
GLUCOSE: 92 mg/dL (ref 70–99)
Potassium: 3.2 mEq/L — ABNORMAL LOW (ref 3.5–5.1)
Sodium: 138 mEq/L (ref 135–145)

## 2014-01-13 NOTE — Telephone Encounter (Signed)
Pt notified. Agrees with plan.

## 2014-01-13 NOTE — Telephone Encounter (Signed)
Pt was told to call back regarding what b/p meds hes currently taking and that we would adjust medications as necessary .  Pt states is currently taking the hydrochlorothiazide 25mg  and the simvastatin 20 mg not taking the  Coreg 12.5mg .

## 2014-01-13 NOTE — Patient Instructions (Signed)
Get your blood work before you leave   Call ASAP and let us know the actual medications that you are  taking  Check the  blood pressure 2 or 3 times a  week be sure it is between 110/60 and 140/85. Ideal blood pressure is 120/80. If it is consistently higher or lower, let me know  Next visit is for a physical exam in 2-3 months, fasting Please make an appointment

## 2014-01-13 NOTE — Telephone Encounter (Addendum)
Advise patient to restart carvedilol 12.5 mg one tablet twice a day, send a prescription, monitor his BP and followup as recommended in 2-3 months Also, advised patient labs are okay except for a  low potassium, recommend KCL 10 meq 1 po qd #30, no RF Check a K+ level in 2 weeks, dx HTN

## 2014-01-13 NOTE — Progress Notes (Signed)
Pre visit review using our clinic review tool, if applicable. No additional management support is needed unless otherwise documented below in the visit note. 

## 2014-01-13 NOTE — Telephone Encounter (Signed)
The patient's wife called and stated she needed to speak with the CMA as soon as possible to relay medication information.   Callback - [phone]

## 2014-01-13 NOTE — Assessment & Plan Note (Addendum)
Since the last time he was here we discontinue amlodipine, edema decreased. Currently on carvedilol and HCTZ, compliance with carvedilol?. BP today elevated, ambulatory BPs in the 170s. Plan: BMP, CBC (patient had  anemia) Patient will call with actual medications he is taking, will adjust his medications as necessary because he's not at goal. Addendum--patient called--Not taking carvedilol, advised him to restart, monitor his BPs

## 2014-01-13 NOTE — Progress Notes (Signed)
Subjective:    Patient ID: [name] Wood, male    DOB: [birth year], 73 y.o.   MRN: [id number]  DOS:  01/13/2014 ROV  Hypertension--off  amlodipine, edema decrease. Unsure about the medications that he is taking, he thinks is taking the diuretic, and carvedilol?. Ambulatory BPs still elevated in the 170s.  ROS Diet-- + limited salt  No  CP, SOB No dysuria, difficulty urinating or blood in the urine    Past Medical History  Diagnosis Date  . Allergy     rhinitis  . Hyperlipidemia   . Hypertension   . Osteoarthritis   . Erectile dysfunction   . Overweight   . Renal insufficiency     baseline cr 1.5 11-06; cr 1.3 on 5-02  . Prostate cancer 2-11    prostate  . Prostate ca 2013    seeding implant  . 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 Tonsillectomy  1967  . Radioactive seed implant  ~2012  . Hernia repair  2009    right  . Thyroidectomy N/A 06/03/2013    Procedure: TOTAL THYROIDECTOMY;  Surgeon: Earnstine Regal, MD;  Location: [facility];  Service: General;  Laterality: N/A;    History   Social History  . Marital Status: Married    Spouse Name: N/A    Number of Children: 4  . Years of Education: N/A   Occupational History  . [employer]    Social History Main Topics  . Smoking status: Never Smoker   . Smokeless tobacco: Never Used  . Alcohol Use: No     Comment: very rarely   . Drug Use: No  . Sexual Activity: Yes    Partners: Female   Other Topics Concern  . Not on file   Social History Narrative  . No narrative on file        Medication List       This list is accurate as of: 01/13/14 11:59 PM.  Always use your most recent med list.               calcium-vitamin D 500-200 MG-UNIT per tablet  Take 1 tablet by mouth 2 (two) times daily with a meal.     carvedilol 12.5 MG tablet  Commonly known as:  COREG  Take 1 tablet (12.5 mg total) by mouth 2 (two) times daily with a  meal.     cholecalciferol 1000 UNITS tablet  Commonly known as:  VITAMIN D  Take 1,000 Units by mouth daily.     fexofenadine 180 MG tablet  Commonly known as:  ALLEGRA  Take 180 mg by mouth daily as needed (allergies).     hydrochlorothiazide 25 MG tablet  Commonly known as:  HYDRODIURIL  TAKE ONE TABLET BY MOUTH ONCE DAILY     levothyroxine 150 MCG tablet  Commonly known as:  SYNTHROID, LEVOTHROID  Take 1 tablet (150 mcg total) by mouth daily.     mometasone 50 MCG/ACT nasal spray  Commonly known as:  NASONEX  Place 2 sprays into the nose daily as needed (rhinitis).     multivitamin Tabs tablet  Take 1 tablet by mouth daily.     polyvinyl alcohol 1.4 % ophthalmic solution  Commonly known as:  LIQUIFILM TEARS  Place 1 drop into both eyes daily as needed (for allergies).     simvastatin 20 MG tablet  Commonly known as:  ZOCOR  TAKE 1 TABLET AT BEDTIME     tamsulosin 0.4 MG Caps capsule  Commonly known as:  FLOMAX  Take 0.4 mg by mouth daily.           Objective:   Physical Exam BP 187/81  Pulse 76  Temp(Src) 97.9 F (36.6 C)  Wt 194 lb (87.998 kg)  SpO2 97% General -- alert, well-developed, NAD.  Lungs -- normal respiratory effort, no intercostal retractions, no accessory muscle use, and normal breath sounds.  Heart-- normal rate, regular rhythm, no murmur.   Extremities-- no pretibial edema bilaterally  Neurologic--  alert & oriented X3. Speech normal, gait normal, strength normal in all extremities.   Psych-- Cognition and judgment appear intact. Cooperative with normal attention span and concentration. No anxious or depressed appearing.     Assessment & Plan:

## 2014-01-16 ENCOUNTER — Telehealth: Payer: Self-pay | Admitting: Internal Medicine

## 2014-01-16 NOTE — Telephone Encounter (Signed)
Relevant patient education assigned to patient using Emmi. ° °

## 2014-02-03 ENCOUNTER — Telehealth: Payer: Self-pay | Admitting: Internal Medicine

## 2014-02-03 DIAGNOSIS — I10 Essential (primary) hypertension: Secondary | ICD-10-CM

## 2014-02-03 NOTE — Telephone Encounter (Signed)
Pts wife states pts b/p average the past few days have been around 160/80's.

## 2014-02-03 NOTE — Telephone Encounter (Signed)
Left message for call back  identifiable     

## 2014-02-03 NOTE — Telephone Encounter (Signed)
Patient wife called and had question concerning her husband's blood pressure. She would like to know does bananas run up your blood pressure. Also what's some things he should avoid eating Please advise.

## 2014-02-03 NOTE — Telephone Encounter (Signed)
Spoke spouse and gave information on foods that are not recommended for hypertensive patients. Patient is seeking to walk on the track and monitor his food. Readings are averaging 165/85 over the last 3 weeks.  Please advise if any changes should be made.

## 2014-02-04 NOTE — Telephone Encounter (Signed)
1. agree with a gradual exercise program ; start with 10 minutes a day  2. BP needs better control, increase Coreg from 12.5 mg twice a day to 25 mg twice a day, call in a prescription #60 and 2 refills. 3. Continue taking BP and also check pulse, if pulse < 55 let me know. 4. Due for a BMP, hypertension. Please arrange (Is he taking potassium?)

## 2014-02-06 NOTE — Telephone Encounter (Signed)
LM for call back.  idendtifiable

## 2014-02-08 MED ORDER — CARVEDILOL 25 MG PO TABS
25.0000 mg | ORAL_TABLET | Freq: Two times a day (BID) | ORAL | Status: AC
Start: 2014-02-08 — End: ?

## 2014-02-08 NOTE — Addendum Note (Signed)
Addended by: Reino Bellis on: 02/08/2014 10:48 AM   Modules accepted: Orders

## 2014-02-08 NOTE — Telephone Encounter (Addendum)
Spoke with patient and enacted orders as noted. Patient has picked up Rx for 12.5mg , advised to take 2 in the morning and 2 in the evening. Advised will send in new Rx. Scheduled labs. States he is taking the potassium.

## 2014-02-09 ENCOUNTER — Other Ambulatory Visit: Payer: Self-pay | Admitting: Internal Medicine

## 2014-02-09 ENCOUNTER — Other Ambulatory Visit (INDEPENDENT_AMBULATORY_CARE_PROVIDER_SITE_OTHER): Payer: Medicare HMO

## 2014-02-09 DIAGNOSIS — I10 Essential (primary) hypertension: Secondary | ICD-10-CM

## 2014-02-09 LAB — BASIC METABOLIC PANEL
BUN: 15 mg/dL (ref 6–23)
CHLORIDE: 100 meq/L (ref 96–112)
CO2: 31 mEq/L (ref 19–32)
Calcium: 8.6 mg/dL (ref 8.4–10.5)
Creatinine, Ser: 1.5 mg/dL (ref 0.4–1.5)
GFR: 60.78 mL/min (ref >60.00)
Glucose, Bld: 104 mg/dL — ABNORMAL HIGH (ref 70–99)
Potassium: 3.3 mEq/L — ABNORMAL LOW (ref 3.5–5.1)
Sodium: 140 mEq/L (ref 135–145)

## 2014-02-09 MED ORDER — TRIAMTERENE-HCTZ 37.5-25 MG PO TABS: 1.0000 | ORAL_TABLET | Freq: Every day | ORAL | Status: DC

## 2014-02-17 ENCOUNTER — Other Ambulatory Visit: Payer: Medicare HMO

## 2014-02-17 ENCOUNTER — Telehealth: Payer: Self-pay

## 2014-02-17 NOTE — Telephone Encounter (Signed)
Patient called to clarify the HCTZ. Reviewed Maxzide 25 and the directions. Patient states understanding.

## 2014-03-09 ENCOUNTER — Other Ambulatory Visit (INDEPENDENT_AMBULATORY_CARE_PROVIDER_SITE_OTHER): Payer: Medicare HMO

## 2014-03-09 DIAGNOSIS — I10 Essential (primary) hypertension: Secondary | ICD-10-CM

## 2014-03-09 LAB — BASIC METABOLIC PANEL
BUN: 16 mg/dL (ref 6–23)
CALCIUM: 9 mg/dL (ref 8.4–10.5)
CHLORIDE: 100 meq/L (ref 96–112)
CO2: 32 meq/L (ref 19–32)
CREATININE: 1.5 mg/dL (ref 0.4–1.5)
GFR: 58.9 mL/min — ABNORMAL LOW (ref >60.00)
GLUCOSE: 93 mg/dL (ref 70–99)
Potassium: 3.4 mEq/L — ABNORMAL LOW (ref 3.5–5.1)
SODIUM: 138 meq/L (ref 135–145)

## 2014-04-03 ENCOUNTER — Other Ambulatory Visit (INDEPENDENT_AMBULATORY_CARE_PROVIDER_SITE_OTHER): Payer: Medicare HMO

## 2014-04-03 DIAGNOSIS — E890 Postprocedural hypothyroidism: Secondary | ICD-10-CM

## 2014-04-03 LAB — T4, FREE: Free T4: 1.28 ng/dL (ref 0.60–1.60)

## 2014-04-03 LAB — TSH: TSH: 2.22 u[IU]/mL (ref 0.35–4.50)

## 2014-04-10 ENCOUNTER — Encounter: Payer: Self-pay | Admitting: Internal Medicine

## 2014-04-10 ENCOUNTER — Ambulatory Visit (INDEPENDENT_AMBULATORY_CARE_PROVIDER_SITE_OTHER): Payer: Medicare HMO | Admitting: Internal Medicine

## 2014-04-10 VITALS — BP 112/72 | HR 78 | Temp 97.3°F | Resp 12 | Wt 193.0 lb

## 2014-04-10 DIAGNOSIS — E890 Postprocedural hypothyroidism: Secondary | ICD-10-CM

## 2014-04-10 DIAGNOSIS — C73 Malignant neoplasm of thyroid gland: Secondary | ICD-10-CM

## 2014-04-10 NOTE — Patient Instructions (Signed)
Please return in 6 months. I ordered a whole body scan for you to be done in 06/2014. Please expect a call from Nuclear Medicine to schedule this. Please come back to the lab in 1 month.

## 2014-04-10 NOTE — Progress Notes (Addendum)
Subjective:     Patient ID: [name] Wood, male   DOB: 1941/05/07, 72 y.o.   MRN: [id number]  HPI Mr Legan is a pleasant 73 y.o. man, returning for f/u for papillary thyroid cancer, follicular variant, multifocal and postsurgical hypothyroidism. Last visit 6 mo ago.  He is on Synthroid 150 mcg daily. He takes it in am, with water, eats b'fast after 2 hours. He is on Coral calcium 1000 mg - takes this at dinnertime. TSH improved after he moved calcium at night.  Reviewed recent TFTs: Lab Results  Component Value Date   TSH 2.22 04/03/2014   TSH 7.00* 11/02/2013   TSH 6.72* 11/02/2013   TSH 12.12* 09/29/2013   TSH 13.60* 06/30/2013   TSH 0.82 02/10/2013   FREET4 1.28 04/03/2014   FREET4 1.13 11/02/2013   FREET4 0.96 09/29/2013   FREET4 1.10 06/30/2013   ThyCa Hx: 01/2013 pt. found to have a goiter on exam with PCP (no previous sxs) 03/01/2013: thyroid ultrasound: Bilateral nodules, with a dominant nodule in the inferior left lobe of 4.9 x 2.4 x 3.1 cm, and upper left lobe of 2.6 x 1.7 x 2.5 cm, with calcifications. On the right, there are several nodules, 3.2 x 2.3 x 2.9 cm, 3.2 x 2.1 x 2.9 cm, and 2 cm in diameter, and additional smaller nodules, less than 1.4 cm. 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 Harlow Asa: at least 5 bilateral foci of papillary thyroid cancer, follicular variant, with the largest being 2.8 cm.  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 06/17/2013: postsurgical visit with Dr. Harlow Asa. calcium 8.9.  06/30/2013: TSH 13.6, fT4 1.10 >> Synthroid increased to  112 mcg, advised to separate more from calcium, but did not do this 07/08/2013: RAI tx 76 mCi post thyrogen injection 2/2 age and comorbidities 07/15/2013: post RAI WBS: only uptake in low neck   Denies weight changes, has cold/hot intolerance, no anxiety, palpitations, tremors. No neck compression sxs.  Patient also has a history of hypertension, hyperlipidemia, erectile dysfunction, renal insufficiency, osteoarthritis, history of prostate cancer - implanted radioactive seeds, obstructive uropathy.  Review of Systems Constitutional: no weight gain/loss, no fatigue, no subjective hyperthermia/hypothermia Eyes: no blurry vision, no xerophthalmia ENT: no sore throat, no nodules palpated in throat, no dysphagia/odynophagia, no hoarseness Cardiovascular: no CP/SOB/palpitations/leg swelling Respiratory: no cough/SOB Gastrointestinal: no N/V/D/C Musculoskeletal: + muscle aches /no joint aches Skin: no rashes Neurological: no tremors/numbness/tingling/dizziness Difficulty with erections, low libido  I reviewed pt's medications, allergies, PMH, social hx, family hx and no changes required, except as mentioned above.  Objective:   Physical Exam BP 112/72  Pulse 78  Temp(Src) 97.3 F (36.3 C) (Oral)  Resp 12  Wt 193 lb (87.544 kg)  SpO2 97% Wt Readings from Last 3 Encounters:  04/10/14 193 lb (87.544 kg)  01/13/14 194 lb (87.998 kg)  09/29/13 191 lb (86.637 kg)   Constitutional: normal weight, in NAD Eyes: PERRLA, EOMI, no exophthalmos ENT: moist mucous membranes, thyroidectomy scar healed - no pain or increased sensitivity to touch; no cervical lymphadenopathy. Cardiovascular: RRR, No  MRG Respiratory: CTA B Gastrointestinal: abdomen soft, NT, ND, BS+ Musculoskeletal: no deformities, strength intact in all 4 Skin: moist, warm, no rashes Neurological: no tremor with outstretched hands, DTR normal in all 4  Assessment:     1. Papillary thyroid cancer, follicular variant,  multifocal  2. Postsurgical hypothyroidism    Plan:     1. And 2. Patient with h/o multifocal papillary thyroid cancer, without extension to the neck, and apparently without lymphovascular invasion. He is s/p RAI ablation post thyrogen and had a negative WBS (only uptake in neck) - in 06/2013.  - In 06/2014, we need a Thyrogen-stimulated WBS. I ordered this and explained what it entails. - We discussed about proper intake of Synthroid. He is taking it correctly now >> TSH finally normal.  - We discussed that we need the TSH a little lower than normal 2/2 his ThyCa.  - We will repeat labs in 1 mo >> may need to increase Synthroid dose then. Will add the Tg + ATA then. - RTC in 6 mo  Component     Latest Ref Rng 05/11/2014  TSH     0.35 - 4.50 uIU/mL 0.77  Free T4     0.60 - 1.60 ng/dL 1.23  Antithyroglobulin Ab     0.0 - 0.9 IU/mL <1.0  THYROGLOBULIN, SERUM     1.4 - 29.2 ng/mL 0.6 (L)   TSH a little higher than desired for a ThyCa pt, but will continue the same Synthroid dose due to age. Tg detectable, but <1. Will recheck when he comes back in 5 mo.

## 2014-04-12 ENCOUNTER — Encounter: Payer: Self-pay | Admitting: Internal Medicine

## 2014-04-12 ENCOUNTER — Ambulatory Visit (INDEPENDENT_AMBULATORY_CARE_PROVIDER_SITE_OTHER): Payer: Medicare HMO | Admitting: Internal Medicine

## 2014-04-12 VITALS — BP 156/78 | HR 60 | Temp 97.4°F | Wt 192.0 lb

## 2014-04-12 DIAGNOSIS — Z23 Encounter for immunization: Secondary | ICD-10-CM

## 2014-04-12 DIAGNOSIS — N259 Disorder resulting from impaired renal tubular function, unspecified: Secondary | ICD-10-CM

## 2014-04-12 DIAGNOSIS — I10 Essential (primary) hypertension: Secondary | ICD-10-CM

## 2014-04-12 LAB — POTASSIUM: POTASSIUM: 4 meq/L (ref 3.5–5.1)

## 2014-04-12 NOTE — Assessment & Plan Note (Addendum)
Good compliance with medication, ambulatory BPs normal. Plan: Check a potassium level, continue with high potassium diet.

## 2014-04-12 NOTE — Progress Notes (Signed)
Pre-visit discussion using our clinic review tool. No additional management support is needed unless otherwise documented below in the visit note.  

## 2014-04-12 NOTE — Patient Instructions (Signed)
Get your blood work before you leave   Next visit is for a physical exam in 3 to 4 months ,  fasting Please make an appointment    Continue checking your blood pressure   At some point this year the clinic will relocate to  [place] and [address] (10 minutes form here)  [place]  [place],  [address] [phone]

## 2014-04-12 NOTE — Progress Notes (Signed)
Subjective:    Patient ID: [name] Wood, male    DOB: 1941/11/07, 73 y.o.   MRN: [id number]  DOS:  04/12/2014 Type of  visit: ROV  Ref. high blood pressure. In general feeling well, good compliance of medication, ambulatory BPs consistently 120/70.  His last potassium was slightly low, Following a high potassium diet   ROS Denies chest pain or difficulty breathing. No lower extremity edema No  nausea, vomiting, diarrhea  Past Medical History  Diagnosis Date  . Allergy     rhinitis  . Hyperlipidemia   . Hypertension   . Osteoarthritis   . Erectile dysfunction   . Overweight   . Renal insufficiency     baseline cr 1.5 11-06; cr 1.3 on 5-02  . Prostate cancer 2-11    prostate  . Prostate ca 2013    seeding implant  . 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 Tonsillectomy  1967  . Radioactive seed implant  ~2012  . Hernia repair  2009    right  . Thyroidectomy N/A 06/03/2013    Procedure: TOTAL THYROIDECTOMY;  Surgeon: Earnstine Regal, MD;  Location: [facility];  Service: General;  Laterality: N/A;    History   Social History  . Marital Status: Married    Spouse Name: N/A    Number of Children: 4  . Years of Education: N/A   Occupational History  . [employer]    Social History Main Topics  . Smoking status: Never Smoker   . Smokeless tobacco: Never Used  . Alcohol Use: No     Comment: very rarely   . Drug Use: No  . Sexual Activity: Yes    Partners: Female   Other Topics Concern  . Not on file   Social History Narrative  . No narrative on file        Medication List       This list is accurate as of: 04/12/14  7:20 PM.  Always use your most recent med list.               calcium-vitamin D 500-200 MG-UNIT per tablet  Take 1 tablet by mouth 2 (two) times daily with a meal.     carvedilol 25 MG tablet  Commonly known as:  COREG  Take 1 tablet (25 mg total) by mouth 2 (two)  times daily with a meal.     cholecalciferol 1000 UNITS tablet  Commonly known as:  VITAMIN D  Take 1,000 Units by mouth daily.     fexofenadine 180 MG tablet  Commonly known as:  ALLEGRA  Take 180 mg by mouth daily as needed (allergies).     levothyroxine 150 MCG tablet  Commonly known as:  SYNTHROID, LEVOTHROID  Take 1 tablet (150 mcg total) by mouth daily.     mometasone 50 MCG/ACT nasal spray  Commonly known as:  NASONEX  Place 2 sprays into the nose daily as needed (rhinitis).     multivitamin Tabs tablet  Take 1 tablet by mouth daily.     polyvinyl alcohol 1.4 % ophthalmic solution  Commonly known as:  LIQUIFILM TEARS  Place 1 drop into both eyes daily as needed (for allergies).     simvastatin 20 MG tablet  Commonly known as:  ZOCOR  TAKE 1 TABLET AT BEDTIME     tamsulosin 0.4 MG Caps capsule  Commonly known as:  FLOMAX  Take 0.4 mg by mouth daily.     triamterene-hydrochlorothiazide 37.5-25 MG per tablet  Commonly known as:  MAXZIDE-25  Take 1 tablet by mouth daily.           Objective:   Physical Exam BP 156/78  Pulse 60  Temp(Src) 97.4 F (36.3 C) (Oral)  Wt 192 lb (87.091 kg)  SpO2 97%  General -- alert, well-developed, NAD.  Lungs -- normal respiratory effort, no intercostal retractions, no accessory muscle use, and normal breath sounds.  Heart-- normal rate, regular rhythm, no murmur.   Extremities-- no pretibial edema bilaterally  Neurologic--  alert & oriented X3.   Psych-- Cognition and judgment appear intact. Cooperative with normal attention span and concentration. No anxious or depressed appearing.     Assessment & Plan:  Needs a pneumonia shot today, prevnar

## 2014-04-14 ENCOUNTER — Telehealth: Payer: Self-pay | Admitting: Internal Medicine

## 2014-04-14 MED ORDER — TRIAMTERENE-HCTZ 37.5-25 MG PO TABS: 1.0000 | ORAL_TABLET | Freq: Every day | ORAL | Status: DC

## 2014-04-14 NOTE — Telephone Encounter (Signed)
Caller name:  [name] Wood  Call back number: [phone] Pharmacy:  [facility]  Reason for call:   Pt stated that he was supposed to have blood pressure medication refilled, pt did not know name of medication.

## 2014-04-14 NOTE — Telephone Encounter (Signed)
Triamterene-hydrochlorothiazide refilled per protocol. E-scribed to [facility] on [address]. JG//CMA

## 2014-04-29 ENCOUNTER — Other Ambulatory Visit: Payer: Self-pay | Admitting: Radiation Oncology

## 2014-05-11 ENCOUNTER — Other Ambulatory Visit (INDEPENDENT_AMBULATORY_CARE_PROVIDER_SITE_OTHER): Payer: Medicare HMO

## 2014-05-11 DIAGNOSIS — C73 Malignant neoplasm of thyroid gland: Secondary | ICD-10-CM

## 2014-05-11 DIAGNOSIS — E890 Postprocedural hypothyroidism: Secondary | ICD-10-CM

## 2014-05-11 LAB — TSH: TSH: 0.77 u[IU]/mL (ref 0.35–4.50)

## 2014-05-11 LAB — T4, FREE: FREE T4: 1.23 ng/dL (ref 0.60–1.60)

## 2014-05-12 LAB — THYROGLOBULIN, SERUM: THYROGLOBULIN, SERUM: 0.6 ng/mL — AB (ref 1.4–29.2)

## 2014-05-12 LAB — THYROGLOBULIN WITH ANTI-TG AB

## 2014-05-27 ENCOUNTER — Other Ambulatory Visit: Payer: Self-pay | Admitting: Internal Medicine

## 2014-05-29 ENCOUNTER — Other Ambulatory Visit: Payer: Self-pay | Admitting: Internal Medicine

## 2014-05-29 DIAGNOSIS — E890 Postprocedural hypothyroidism: Secondary | ICD-10-CM

## 2014-05-29 MED ORDER — LEVOTHYROXINE SODIUM 150 MCG PO TABS
150.0000 ug | ORAL_TABLET | Freq: Every day | ORAL | Status: DC
Start: 2014-05-29 — End: 2014-10-13

## 2014-05-31 ENCOUNTER — Other Ambulatory Visit: Payer: Self-pay | Admitting: Radiation Oncology

## 2014-06-30 ENCOUNTER — Other Ambulatory Visit: Payer: Self-pay | Admitting: Radiation Oncology

## 2014-07-05 ENCOUNTER — Telehealth: Payer: Self-pay | Admitting: Internal Medicine

## 2014-07-05 NOTE — Telephone Encounter (Signed)
I believe he cannot stay near the child, but please give him the nr for [place] to check with them directly.

## 2014-07-05 NOTE — Telephone Encounter (Signed)
Please read note below and advise.  

## 2014-07-05 NOTE — Telephone Encounter (Signed)
Called pt and advised him per Dr Arman Filter note. Gave pt the # for him to call [facility].

## 2014-07-05 NOTE — Telephone Encounter (Signed)
Patient is going to [place] to get shots Will these effect him being around a child? They care a 63 month old   [phone]  Thank You

## 2014-07-10 ENCOUNTER — Encounter (HOSPITAL_COMMUNITY)
Admission: RE | Admit: 2014-07-10 | Discharge: 2014-07-10 | Disposition: A | Discharge status: Home / Self Care | Payer: Medicare HMO | Source: Ambulatory Visit | Attending: Internal Medicine | Admitting: Internal Medicine

## 2014-07-10 DIAGNOSIS — C73 Malignant neoplasm of thyroid gland: Secondary | ICD-10-CM | POA: Diagnosis present

## 2014-07-10 MED ORDER — THYROTROPIN ALFA 1.1 MG IM SOLR
0.9000 mg | INTRAMUSCULAR | Status: AC
  Administered 2014-07-10: 0.9 mg via INTRAMUSCULAR
  Filled 2014-07-10: qty 0.9

## 2014-07-11 ENCOUNTER — Encounter (HOSPITAL_COMMUNITY)
Admission: RE | Admit: 2014-07-11 | Discharge: 2014-07-11 | Disposition: A | Discharge status: Home / Self Care | Payer: Medicare HMO | Source: Ambulatory Visit | Attending: Internal Medicine | Admitting: Internal Medicine

## 2014-07-11 DIAGNOSIS — C73 Malignant neoplasm of thyroid gland: Secondary | ICD-10-CM | POA: Diagnosis not present

## 2014-07-11 MED ORDER — THYROTROPIN ALFA 1.1 MG IM SOLR
0.9000 mg | INTRAMUSCULAR | Status: AC
  Administered 2014-07-11: 0.9 mg via INTRAMUSCULAR
  Filled 2014-07-11: qty 0.9

## 2014-07-11 MED ORDER — TECHNETIUM TC 99M MEDRONATE IV KIT
26.2000 | PACK | Freq: Once | INTRAVENOUS | Status: DC | PRN
Start: 2014-07-11 — End: 2014-07-11

## 2014-07-12 ENCOUNTER — Encounter (HOSPITAL_COMMUNITY)
Admission: RE | Admit: 2014-07-12 | Discharge: 2014-07-12 | Disposition: A | Discharge status: Home / Self Care | Payer: Medicare HMO | Source: Ambulatory Visit | Attending: Internal Medicine | Admitting: Internal Medicine

## 2014-07-14 ENCOUNTER — Encounter (HOSPITAL_COMMUNITY)
Admission: RE | Admit: 2014-07-14 | Discharge: 2014-07-14 | Disposition: A | Discharge status: Home / Self Care | Payer: Medicare HMO | Source: Ambulatory Visit | Attending: Internal Medicine | Admitting: Internal Medicine

## 2014-07-14 ENCOUNTER — Other Ambulatory Visit: Payer: Self-pay | Admitting: Internal Medicine

## 2014-07-14 DIAGNOSIS — C73 Malignant neoplasm of thyroid gland: Secondary | ICD-10-CM | POA: Diagnosis not present

## 2014-07-14 LAB — THYROGLOBULIN LEVEL: THYROGLOBULIN: 9 ng/mL (ref 0.0–55.0)

## 2014-07-14 LAB — THYROGLOBULIN ANTIBODY

## 2014-07-14 MED ORDER — SODIUM IODIDE I 131 CAPSULE
4.0000 | Freq: Once | INTRAVENOUS | Status: AC | PRN
Start: 2014-07-14 — End: 2014-07-14
  Administered 2014-07-14: 4 via ORAL

## 2014-07-20 ENCOUNTER — Telehealth: Payer: Self-pay | Admitting: Internal Medicine

## 2014-07-20 NOTE — Telephone Encounter (Signed)
Called pt and wife, lvm advising them per Dr Arman Filter note. Advised if any further questions to call our office.

## 2014-07-20 NOTE — Telephone Encounter (Signed)
Patient would like to know the result of her husband radiation treatment. Please advise

## 2014-07-20 NOTE — Telephone Encounter (Signed)
Please read note below and advise.  

## 2014-07-20 NOTE — Telephone Encounter (Signed)
They were sent through [facility] 1 week ago:  Entered by Philemon Kingdom, MD at 07/14/2014 11:45 AM   Dear Mr Justine Null news: no signs of metastases or recurrence of the thyroid cancer!  Sincerely,  Philemon Kingdom MD

## 2014-07-31 ENCOUNTER — Other Ambulatory Visit: Payer: Self-pay | Admitting: Radiation Oncology

## 2014-08-09 ENCOUNTER — Encounter: Payer: Medicare HMO | Admitting: Internal Medicine

## 2014-08-30 ENCOUNTER — Other Ambulatory Visit: Payer: Self-pay | Admitting: Radiation Oncology

## 2014-09-14 ENCOUNTER — Other Ambulatory Visit: Payer: Self-pay

## 2014-09-30 ENCOUNTER — Other Ambulatory Visit: Payer: Self-pay | Admitting: Radiation Oncology

## 2014-10-02 ENCOUNTER — Other Ambulatory Visit: Payer: Self-pay

## 2014-10-02 MED ORDER — TRIAMTERENE-HCTZ 37.5-25 MG PO TABS: 1.0000 | ORAL_TABLET | Freq: Every day | ORAL | Status: DC

## 2014-10-12 ENCOUNTER — Encounter: Payer: Self-pay | Admitting: Internal Medicine

## 2014-10-12 ENCOUNTER — Ambulatory Visit (INDEPENDENT_AMBULATORY_CARE_PROVIDER_SITE_OTHER): Payer: Medicare HMO | Admitting: Internal Medicine

## 2014-10-12 ENCOUNTER — Other Ambulatory Visit: Payer: Self-pay | Admitting: Internal Medicine

## 2014-10-12 VITALS — BP 122/70 | HR 74 | Temp 97.7°F | Resp 12 | Wt 192.0 lb

## 2014-10-12 DIAGNOSIS — E890 Postprocedural hypothyroidism: Secondary | ICD-10-CM

## 2014-10-12 DIAGNOSIS — C73 Malignant neoplasm of thyroid gland: Secondary | ICD-10-CM

## 2014-10-12 LAB — T4, FREE: Free T4: 1.93 ng/dL — ABNORMAL HIGH (ref 0.80–1.80)

## 2014-10-12 LAB — TSH: TSH: 0.325 u[IU]/mL — AB (ref 0.350–4.500)

## 2014-10-12 NOTE — Patient Instructions (Signed)
Please stop at the lab.  Please return in 1 year.  

## 2014-10-12 NOTE — Progress Notes (Signed)
Subjective:     Patient ID: [name] Wood, male   DOB: 1941/11/23, 73 y.o.   MRN: [id number]  HPI [name] Wood is a pleasant 73 y.o. man, returning for f/u for papillary thyroid cancer, follicular variant, multifocal and postsurgical hypothyroidism. Last visit 6 mo ago.  He is on Synthroid 150 mcg daily. He takes it in am, with water, eats b'fast after 2 hours.  He is on Coral calcium 1000 mg - takes this at dinnertime. TSH improved after he moved calcium at night. He takes MVI in pm.  Reviewed recent TFTs: Lab Results  Component Value Date   TSH 0.77 05/11/2014   TSH 2.22 04/03/2014   TSH 7.00* 11/02/2013   TSH 6.72* 11/02/2013   TSH 12.12* 09/29/2013   FREET4 1.23 05/11/2014   FREET4 1.28 04/03/2014   FREET4 1.13 11/02/2013   FREET4 0.96 09/29/2013   FREET4 1.10 06/30/2013   ThyCa Hx: 01/2013 pt. found to have a goiter on exam with PCP (no previous sxs) 03/01/2013: thyroid ultrasound: Bilateral nodules, with a dominant nodule in the inferior left lobe of 4.9 x 2.4 x 3.1 cm, and upper left lobe of 2.6 x 1.7 x 2.5 cm, with calcifications. On the right, there are several nodules, 3.2 x 2.3 x 2.9 cm, 3.2 x 2.1 x 2.9 cm, and 2 cm in diameter, and additional smaller nodules, less than 1.4 cm. 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 Harlow Asa: at least 5 bilateral foci of papillary thyroid cancer, follicular variant, with the largest being 2.8 cm.  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 06/17/2013: postsurgical visit with Dr. Harlow Asa. calcium 8.9.  06/30/2013: TSH 13.6,  fT4 1.10 >> Synthroid increased to 112 mcg, advised to separate more from calcium, but did not do this 07/08/2013: RAI tx 73 mCi post thyrogen injection 2/2 age and comorbidities 07/15/2013: post RAI WBS: only uptake in low neck  05/11/2014: TSH 0.77, fT4 1.23, Tg 0.6, ATA <1. 07/10/2014: Tg-stim WBS: No evidence of local thyroid cancer recurrence or distant metastasis 07/14/2014: Tg 9.0, ATA <20  Denies weight changes, has cold/hot intolerance, no anxiety, palpitations, tremors. No neck compression sxs.  Patient also has a history of hypertension, hyperlipidemia, erectile dysfunction, renal insufficiency, osteoarthritis, history of prostate cancer - implanted radioactive seeds, obstructive uropathy.  Review of Systems Constitutional: no weight gain/loss, no fatigue, no subjective hyperthermia/hypothermia Eyes: no blurry vision, no xerophthalmia ENT: no sore throat, no nodules palpated in throat, no dysphagia/odynophagia, no hoarseness Cardiovascular: no CP/SOB/palpitations/leg swelling Respiratory: no cough/SOB Gastrointestinal: no N/V/D/C Musculoskeletal: no muscle aches /no joint aches Skin: no rashes Neurological: no tremors/numbness/tingling/dizziness Difficulty with erections, low libido  I reviewed pt's medications, allergies, PMH, social hx, family hx and no changes required, except as mentioned above.  Objective:   Physical Exam BP 122/70 mmHg  Pulse 74  Temp(Src) 97.7 F (36.5 C) (Oral)  Resp 12  Wt 192 lb (87.091 kg)  SpO2 95% Wt Readings from Last 3 Encounters:  10/12/14 192 lb (87.091 kg)  04/12/14 192 lb (87.091 kg)  04/10/14 193 lb (87.544  kg)   Constitutional: normal weight, in NAD Eyes: PERRLA, EOMI, no exophthalmos ENT: moist mucous membranes, thyroidectomy scar healed - no pain or increased sensitivity to touch; no cervical lymphadenopathy. Cardiovascular: RRR, No MRG Respiratory: CTA B Gastrointestinal: abdomen soft, NT, ND, BS+ Musculoskeletal: no  deformities, strength intact in all 4 Skin: moist, warm, no rashes Neurological: no tremor with outstretched hands, DTR normal in all 4  Assessment:     1. Papillary thyroid cancer, follicular variant, multifocal  2. Postsurgical hypothyroidism    Plan:     1. And 2. Patient with h/o multifocal papillary thyroid cancer, without extension to the neck, and apparently without lymphovascular invasion. He is s/p RAI ablation post thyrogen and had 2 negative WBS's - in 06/2013 and 06/2014. We reviewed his results for these studies, and they are very reassuring.  - We discussed about proper intake of Synthroid. He is taking it correctly now >> TSH normal - We discussed that we need the TSH a little lower (but would prefer still in the normal range 2/2 age)  - We will repeat labs today and add Tg + ATA - RTC in 1 year, we will likely need a neck U/S at that time.   Orders Only on 10/12/2014  Component Date Value Ref Range Status  . TSH 10/12/2014 0.325* 0.350 - 4.500 uIU/mL Final  . Free T4 10/12/2014 1.93* 0.80 - 1.80 ng/dL Final  Tg + ATA not drawn??? ([facility]). Will need to come back for these  TSH too suppressed for age >> will need to decrease the LT4 dose to 125. Will draw Tg + ATA when he comes back for repeat TFTs in 6 weeks.

## 2014-10-13 ENCOUNTER — Encounter: Payer: Self-pay | Admitting: *Deleted

## 2014-10-13 ENCOUNTER — Other Ambulatory Visit: Payer: Self-pay | Admitting: *Deleted

## 2014-10-13 MED ORDER — LEVOTHYROXINE SODIUM 125 MCG PO TABS: 125.0000 ug | ORAL_TABLET | Freq: Every day | ORAL | Status: AC

## 2014-10-18 ENCOUNTER — Ambulatory Visit (INDEPENDENT_AMBULATORY_CARE_PROVIDER_SITE_OTHER): Payer: Medicare HMO | Admitting: Internal Medicine

## 2014-10-18 ENCOUNTER — Encounter: Payer: Self-pay | Admitting: Internal Medicine

## 2014-10-18 VITALS — BP 173/84 | HR 66 | Temp 98.0°F | Ht 71.0 in | Wt 189.1 lb

## 2014-10-18 DIAGNOSIS — Z23 Encounter for immunization: Secondary | ICD-10-CM

## 2014-10-18 DIAGNOSIS — E785 Hyperlipidemia, unspecified: Secondary | ICD-10-CM

## 2014-10-18 DIAGNOSIS — C61 Malignant neoplasm of prostate: Secondary | ICD-10-CM

## 2014-10-18 DIAGNOSIS — Z0000 Encounter for general adult medical examination without abnormal findings: Secondary | ICD-10-CM

## 2014-10-18 DIAGNOSIS — I10 Essential (primary) hypertension: Secondary | ICD-10-CM

## 2014-10-18 DIAGNOSIS — R1900 Intra-abdominal and pelvic swelling, mass and lump, unspecified site: Secondary | ICD-10-CM

## 2014-10-18 NOTE — Assessment & Plan Note (Signed)
BP today slightly elevated, ambulatory BPs consistently normal. Continue with carvedilol

## 2014-10-18 NOTE — Assessment & Plan Note (Signed)
Superficial abdominal mass, patient was unaware of it, it seems to be asymptomatic. We'll check a CT of the abdomen without contrast. Consider surgical referral

## 2014-10-18 NOTE — Assessment & Plan Note (Signed)
Continue with simvastatin, check FLP

## 2014-10-18 NOTE — Patient Instructions (Signed)
Go to the fourth floor and get your labs:  We will schedule a CT of the abdomen  Next visit to see me in 6 months  Continue checking your blood pressures daily, call if it is consistently more than 140/85      Preventive Care for Adults Ages 28 and over  Blood pressure check.** / Every 1 to 2 years.  Lipid and cholesterol check.**/ Every 5 years beginning at age 74.  Lung cancer screening. / Every year if you are aged 60-80 years and have a 30-pack-year history of smoking and currently smoke or have quit within the past 15 years. Yearly screening is stopped once you have quit smoking for at least 15 years or develop a health problem that would prevent you from having lung cancer treatment.  Fecal occult blood test (FOBT) of stool. / Every year beginning at age 49 and continuing until age 73. You may not have to do this test if you get a colonoscopy every 10 years.  Flexible sigmoidoscopy** or colonoscopy.** / Every 5 years for a flexible sigmoidoscopy or every 10 years for a colonoscopy beginning at age 23 and continuing until age 29.  Hepatitis C blood test.** / For all people born from [birth year] through 1966 and any individual with known risks for hepatitis C.  Abdominal aortic aneurysm (AAA) screening.** / A one-time screening for ages 8 to 81 years who are current or former smokers.  Skin self-exam. / Monthly.  Influenza vaccine. / Every year.  Tetanus, diphtheria, and acellular pertussis (Tdap/Td) vaccine.** / 1 dose of Td every 10 years.  Varicella vaccine.** / Consult your health care provider.  Zoster vaccine.** / 1 dose for adults aged 47 years or older.  Pneumococcal 13-valent conjugate (PCV13) vaccine.** / Consult your health care provider.  Pneumococcal polysaccharide (PPSV23) vaccine.** / 1 dose for all adults aged 24 years and older.  Meningococcal vaccine.** / Consult your health care provider.  Hepatitis A vaccine.** / Consult your health care  provider.  Hepatitis B vaccine.** / Consult your health care provider.  Haemophilus influenzae type b (Hib) vaccine.** / Consult your health care provider. **Family history and personal history of risk and conditions may change your health care provider's recommendations. Document Released: 01/06/2002 Document Revised: 11/15/2013 Document Reviewed: 04/07/2011 [facility] Patient Information 2015 [place], [place]. This information is not intended to replace advice given to you by your health care provider. Make sure you discuss any questions you have with your health care provider.    Fall Prevention and Home Safety Falls cause injuries and can affect all age groups. It is possible to use preventive measures to significantly decrease the likelihood of falls. There are many simple measures which can make your home safer and prevent falls. OUTDOORS  Repair cracks and edges of walkways and driveways.  Remove high doorway thresholds.  Trim shrubbery on the main path into your home.  Have good outside lighting.  Clear walkways of tools, rocks, debris, and clutter.  Check that handrails are not broken and are securely fastened. Both sides of steps should have handrails.  Have leaves, snow, and ice cleared regularly.  Use sand or salt on walkways during winter months.  In the garage, clean up grease or oil spills. BATHROOM  Install night lights.  Install grab bars by the toilet and in the tub and shower.  Use non-skid mats or decals in the tub or shower.  Place a plastic non-slip stool in the shower to sit on, if needed.  Keep floors dry and clean up all water on the floor immediately.  Remove soap buildup in the tub or shower on a regular basis.  Secure bath mats with non-slip, double-sided rug tape.  Remove throw rugs and tripping hazards from the floors. BEDROOMS  Install night lights.  Make sure a bedside light is easy to reach.  Do not use oversized bedding.  Keep a  telephone by your bedside.  Have a firm chair with side arms to use for getting dressed.  Remove throw rugs and tripping hazards from the floor. KITCHEN  Keep handles on pots and pans turned toward the center of the stove. Use back burners when possible.  Clean up spills quickly and allow time for drying.  Avoid walking on wet floors.  Avoid hot utensils and knives.  Position shelves so they are not too high or low.  Place commonly used objects within easy reach.  If necessary, use a sturdy step stool with a grab bar when reaching.  Keep electrical cables out of the way.  Do not use floor polish or wax that makes floors slippery. If you must use wax, use non-skid floor wax.  Remove throw rugs and tripping hazards from the floor. STAIRWAYS  Never leave objects on stairs.  Place handrails on both sides of stairways and use them. Fix any loose handrails. Make sure handrails on both sides of the stairways are as long as the stairs.  Check carpeting to make sure it is firmly attached along stairs. Make repairs to worn or loose carpet promptly.  Avoid placing throw rugs at the top or bottom of stairways, or properly secure the rug with carpet tape to prevent slippage. Get rid of throw rugs, if possible.  Have an electrician put in a light switch at the top and bottom of the stairs. OTHER FALL PREVENTION TIPS  Wear low-heel or rubber-soled shoes that are supportive and fit well. Wear closed toe shoes.  When using a stepladder, make sure it is fully opened and both spreaders are firmly locked. Do not climb a closed stepladder.  Add color or contrast paint or tape to grab bars and handrails in your home. Place contrasting color strips on first and last steps.  Learn and use mobility aids as needed. Install an electrical emergency response system.  Turn on lights to avoid dark areas. Replace light bulbs that burn out immediately. Get light switches that glow.  Arrange furniture  to create clear pathways. Keep furniture in the same place.  Firmly attach carpet with non-skid or double-sided tape.  Eliminate uneven floor surfaces.  Select a carpet pattern that does not visually hide the edge of steps.  Be aware of all pets. OTHER HOME SAFETY TIPS  Set the water temperature for 120 F (48.8 C).  Keep emergency numbers on or near the telephone.  Keep smoke detectors on every level of the home and near sleeping areas. Document Released: 10/31/2002 Document Revised: 05/11/2012 Document Reviewed: 01/30/2012 [facility] Patient Information 2015 [place], [place]. This information is not intended to replace advice given to you by your health care provider. Make sure you discuss any questions you have with your health care provider.

## 2014-10-18 NOTE — Progress Notes (Signed)
Subjective:    Patient ID: [name] Wood, male    DOB: 1941/08/31, 73 y.o.   MRN: [id number]  DOS:  10/18/2014 Type of visit - description :   Here for Medicare AWV: 1. Risk factors based on Past M, S, F history: reviewed 2. Physical Activities: still works , bowling    3. Depression/mood:  (-) screening   4. Hearing:  No problemss noted or reported   5. ADL's:  Independent, still drives   6. Fall Risk: low risk, see instructions   7. home Safety: does feel safe at home   8. Height, weight, &visual acuity: see VS, vision slt decreased , saw eye doctor this year, new glasses 9. Counseling: provided 10. Labs ordered based on risk factors: if needed   11. Referral Coordination: if needed 12.  Care Plan, see assessment and plan   13.   Cognitive Assessment: nor mal motor and cognition noted  14. Team care updated 15 personalized care list provided   In addition, today we discussed the following: Hypertension, good compliance of medication, BPs are checked daily and they're usually 120/73. Hyperlipidemia, good compliance with simvastatin History of thyroid cancer, last visit with endocrinology few days ago. History of prostate cancer, last visit with urology last month, he got good reports.  ROS Denies chest pain or difficulty breathing No nausea, vomiting, diarrhea or blood in the stools. No dysuria, hematuria or difficulty urinating  Past Medical History  Diagnosis Date  . Allergy     rhinitis  . Hyperlipidemia   . Hypertension   . Osteoarthritis   . Erectile dysfunction   . Overweight(278.02)   . Renal insufficiency     baseline cr 1.5 11-06; cr 1.3 on 5-02  . Prostate cancer 2-11    prostate  . Prostate ca 2013    seeding implant  . 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 Tonsillectomy  1967  . Radioactive seed implant  ~2012  . Hernia repair  2009    right  . Thyroidectomy N/A 06/03/2013   Procedure: TOTAL THYROIDECTOMY;  Surgeon: Earnstine Regal, MD;  Location: [facility];  Service: General;  Laterality: N/A;    History   Social History  . Marital Status: Married    Spouse Name: N/A    Number of Children: 4  . Years of Education: N/A   Occupational History  . [employer]    Social History Main Topics  . Smoking status: Never Smoker   . Smokeless tobacco: Never Used  . Alcohol Use: 0.0 oz/week    0 Not specified per week     Comment: very rarely   . Drug Use: No  . Sexual Activity:    Partners: Female   Other Topics Concern  . Not on file   Social History Narrative   Household-- pt and wife     Family History  Problem Relation Age of Onset  . Alzheimer's disease Mother   . Diabetes Father   . Kidney disease Father   . Cancer Sister     ? type  . Colon cancer Neg Hx   . Prostate cancer Neg Hx   . CAD Neg Hx        Medication List       This list is accurate as of: 10/18/14 11:59 PM.  Always use your most recent med list.  calcium-vitamin D 500-200 MG-UNIT per tablet  Take 1 tablet by mouth 2 (two) times daily with a meal.     carvedilol 25 MG tablet  Commonly known as:  COREG  Take 1 tablet (25 mg total) by mouth 2 (two) times daily with a meal.     cholecalciferol 1000 UNITS tablet  Commonly known as:  VITAMIN D  Take 1,000 Units by mouth daily.     fexofenadine 180 MG tablet  Commonly known as:  ALLEGRA  Take 180 mg by mouth daily as needed (allergies).     levothyroxine 125 MCG tablet  Commonly known as:  SYNTHROID, LEVOTHROID  Take 1 tablet (125 mcg total) by mouth daily.     mometasone 50 MCG/ACT nasal spray  Commonly known as:  NASONEX  Place 2 sprays into the nose daily as needed (rhinitis).     multivitamin Tabs tablet  Take 1 tablet by mouth daily.     polyvinyl alcohol 1.4 % ophthalmic solution  Commonly known as:  LIQUIFILM TEARS  Place 1 drop into both eyes daily as needed (for allergies).      simvastatin 20 MG tablet  Commonly known as:  ZOCOR  TAKE ONE TABLET BY MOUTH AT BEDTIME     tamsulosin 0.4 MG Caps capsule  Commonly known as:  FLOMAX  TAKE ONE CAPSULE BY MOUTH AT BEDTIME     triamterene-hydrochlorothiazide 37.5-25 MG per tablet  Commonly known as:  MAXZIDE-25  Take 1 tablet by mouth daily.           Objective:   Physical Exam  Abdominal:     BP 173/84 mmHg  Pulse 66  Temp(Src) 98 F (36.7 C) (Oral)  Ht 5\' 11"  (1.803 m)  Wt 189 lb 2 oz (85.787 kg)  BMI 26.39 kg/m2  SpO2 97% General -- alert, well-developed, NAD.  HEENT-- Not pale.   Lungs -- normal respiratory effort, no intercostal retractions, no accessory muscle use, and normal breath sounds.  Heart-- normal rate, regular rhythm, no murmur.  Abdomen-- Not distended, good bowel sounds,soft, non-tender. Has a  Mass, see graphic Extremities-- no pretibial edema bilaterally  Neurologic--  alert & oriented X3. Speech normal, gait appropriate for age, strength symmetric and appropriate for age.  Psych-- Cognition and judgment appear intact. Cooperative with normal attention span and concentration. No anxious or depressed appearing.        Assessment & Plan:

## 2014-10-18 NOTE — Assessment & Plan Note (Signed)
Patient reports he saw Dr. Janice Norrie last month, got good reports. Last available office visit note is from April 2014.

## 2014-10-18 NOTE — Assessment & Plan Note (Signed)
  Assessment & Plan:  Td -- patient reported a Td aprox 2007 pneumonia shot -- 2014 prevnar-- today shingles shot -- Rx provided last year, states he got it  Declined flu shot , benefits discussed    continue w/ healthy life style    Cscope 8-07 @ [facility] (no records)-- reports it was completely normal , we couldn't get records

## 2014-10-18 NOTE — Progress Notes (Signed)
Pre visit review using our clinic review tool, if applicable. No additional management support is needed unless otherwise documented below in the visit note. 

## 2014-10-19 LAB — COMPLETE METABOLIC PANEL WITH GFR
ALK PHOS: 58 U/L (ref 39–117)
ALT: 28 U/L (ref 0–53)
AST: 31 U/L (ref 0–37)
Albumin: 4.3 g/dL (ref 3.5–5.2)
BUN: 17 mg/dL (ref 6–23)
CO2: 32 mEq/L (ref 19–32)
CREATININE: 1.39 mg/dL — AB (ref 0.50–1.35)
Calcium: 9.4 mg/dL (ref 8.4–10.5)
Chloride: 98 mEq/L (ref 96–112)
GFR, EST AFRICAN AMERICAN: 57 mL/min — AB
GFR, Est Non African American: 50 mL/min — ABNORMAL LOW
Glucose, Bld: 74 mg/dL (ref 70–99)
Potassium: 4 mEq/L (ref 3.5–5.3)
SODIUM: 137 meq/L (ref 135–145)
TOTAL PROTEIN: 7.1 g/dL (ref 6.0–8.3)
Total Bilirubin: 0.7 mg/dL (ref 0.2–1.2)

## 2014-10-19 LAB — CBC WITH DIFFERENTIAL/PLATELET
BASOS ABS: 0.1 10*3/uL (ref 0.0–0.1)
Basophils Relative: 1 % (ref 0–1)
EOS PCT: 4 % (ref 0–5)
Eosinophils Absolute: 0.2 10*3/uL (ref 0.0–0.7)
HCT: 39.7 % (ref 39.0–52.0)
Hemoglobin: 13.7 g/dL (ref 13.0–17.0)
LYMPHS PCT: 26 % (ref 12–46)
Lymphs Abs: 1.4 10*3/uL (ref 0.7–4.0)
MCH: 31.4 pg (ref 26.0–34.0)
MCHC: 34.5 g/dL (ref 30.0–36.0)
MCV: 91.1 fL (ref 78.0–100.0)
MPV: 9.5 fL (ref 9.4–12.4)
Monocytes Absolute: 0.6 10*3/uL (ref 0.1–1.0)
Monocytes Relative: 11 % (ref 3–12)
NEUTROS ABS: 3.1 10*3/uL (ref 1.7–7.7)
Neutrophils Relative %: 58 % (ref 43–77)
PLATELETS: 351 10*3/uL (ref 150–400)
RBC: 4.36 MIL/uL (ref 4.22–5.81)
RDW: 13.2 % (ref 11.5–15.5)
WBC: 5.4 10*3/uL (ref 4.0–10.5)

## 2014-10-19 LAB — LIPID PANEL
CHOL/HDL RATIO: 3.2 ratio
Cholesterol: 152 mg/dL (ref 0–200)
HDL: 47 mg/dL (ref >39)
LDL CALC: 89 mg/dL (ref 0–99)
Triglycerides: 78 mg/dL (ref <150)
VLDL: 16 mg/dL (ref 0–40)

## 2014-10-30 ENCOUNTER — Other Ambulatory Visit: Payer: Self-pay | Admitting: Radiation Oncology

## 2014-11-01 ENCOUNTER — Other Ambulatory Visit (HOSPITAL_BASED_OUTPATIENT_CLINIC_OR_DEPARTMENT_OTHER): Payer: Medicare HMO

## 2014-11-13 ENCOUNTER — Ambulatory Visit (HOSPITAL_BASED_OUTPATIENT_CLINIC_OR_DEPARTMENT_OTHER)
Admission: RE | Admit: 2014-11-13 | Discharge: 2014-11-13 | Disposition: A | Discharge status: Home / Self Care | Payer: Medicare HMO | Source: Ambulatory Visit | Attending: Internal Medicine | Admitting: Internal Medicine

## 2014-11-13 ENCOUNTER — Telehealth: Payer: Self-pay | Admitting: Internal Medicine

## 2014-11-13 DIAGNOSIS — Z8546 Personal history of malignant neoplasm of prostate: Secondary | ICD-10-CM | POA: Insufficient documentation

## 2014-11-13 DIAGNOSIS — R1900 Intra-abdominal and pelvic swelling, mass and lump, unspecified site: Secondary | ICD-10-CM | POA: Insufficient documentation

## 2014-11-13 DIAGNOSIS — Z85850 Personal history of malignant neoplasm of thyroid: Secondary | ICD-10-CM | POA: Insufficient documentation

## 2014-11-13 NOTE — Telephone Encounter (Signed)
Spoke with GI, they suggest as surgical referral. Plan:  Referral, MRI, CT chest in one year (he also has a lung nodule) Patient aware, in agreement.

## 2014-11-13 NOTE — Telephone Encounter (Signed)
Phone call from radiology, abdominal mass is  suspicious for GIST, other consideration includes , a gastric lymphoma etc.;other  findings could be metastatic disease but that is less likely. They recommend an MRI of the abdomen with and without, given his creatinine will use half contracts dose. Before we proceed, I will contact GI to see what would be the best way to get a tissue diagnosis. ------ IMPRESSION: 1. Large 11 cm diameter round mass situated along the lesser curve of the stomach. Suspect this is arising from the stomach, uncertain. No surrounding inflammation. No lymphadenopathy. Top differential considerations include Gastrointestinal Stromal Tumor (GIST), leiomyoma/leiomyosarcoma, less likely lymphoma or metastatic disease. 2. Recommend followup abdomen MRI without and with contrast to further characterize the mass in #1, and also the incidental findings in #3 and #4: 3. 2 cm intermediate density medial exophytic left renal lesion, favor proteinaceous benign cyst. 4. Multiple hypodense lesions in the liver, favor benign cysts. 5. Three mm subpleural lung nodule in the right middle lobe, favor benign/postinflammatory etiology. Ordinarily a followup chest CT in 1 year would be recommended, but disposition of this can be determined by the workup of #1. 6. Benign left adrenal adenoma. Benign right renal cysts.

## 2014-11-24 HISTORY — PX: STOMACH SURGERY: SHX791

## 2014-12-07 ENCOUNTER — Telehealth: Payer: Self-pay | Admitting: Internal Medicine

## 2014-12-07 NOTE — Telephone Encounter (Signed)
Spoke with Pts wife, Enid Derry, informed her that insurance has denied MRI of abdomen; Dr. Larose Kells recommends to wait and see what general surgery has to say before continuing with MRI appeal. Enid Derry verbalized understanding and stated she would inform Pt of decision.

## 2014-12-07 NOTE — Telephone Encounter (Signed)
Advise patient, MRI has been denied by his insurance. Plan to wait and see what surgery determines, if they need a MRI I will call the insurance and appeal

## 2014-12-10 ENCOUNTER — Telehealth: Payer: Self-pay | Admitting: Internal Medicine

## 2014-12-10 NOTE — Telephone Encounter (Signed)
Please check status of surgery referral, let me know

## 2014-12-11 NOTE — Telephone Encounter (Signed)
Please advise. Thanks.  

## 2014-12-11 NOTE — Telephone Encounter (Signed)
Pt has appointment w/ Dr Raul Del on 12/18/14

## 2014-12-11 NOTE — Telephone Encounter (Signed)
FYI

## 2014-12-11 NOTE — Telephone Encounter (Signed)
thx

## 2014-12-11 NOTE — Telephone Encounter (Signed)
Referral was faxed to [facility] on 11/19/14/called to check on status, had to leave message. Will refax referral as well

## 2015-01-03 ENCOUNTER — Other Ambulatory Visit: Payer: Self-pay | Admitting: Internal Medicine

## 2015-01-09 IMAGING — US US RENAL
1 series · 14 of 25 positions shown · non-contrast
Comparison: None.

CLINICAL DATA: Chronic renal insufficiency

RENAL/URINARY TRACT ULTRASOUND COMPLETE

[Series 1: us renal · 0.26mm/px · 14 of 62 slices shown]
[im 1/62]
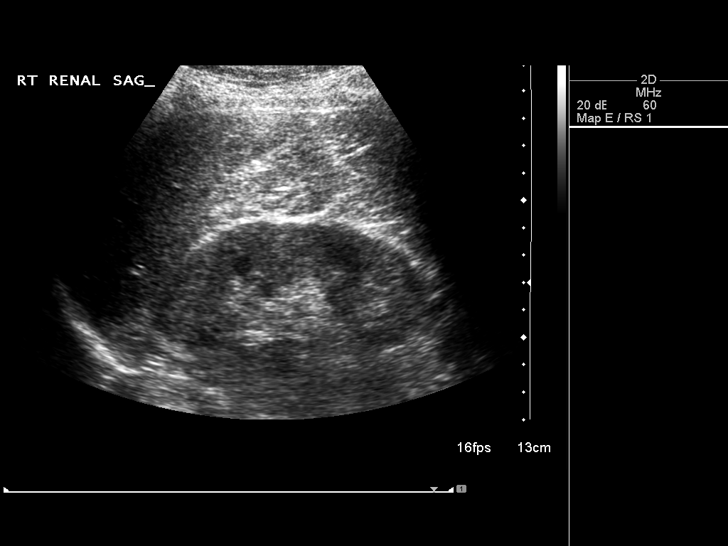
[im 6/62]
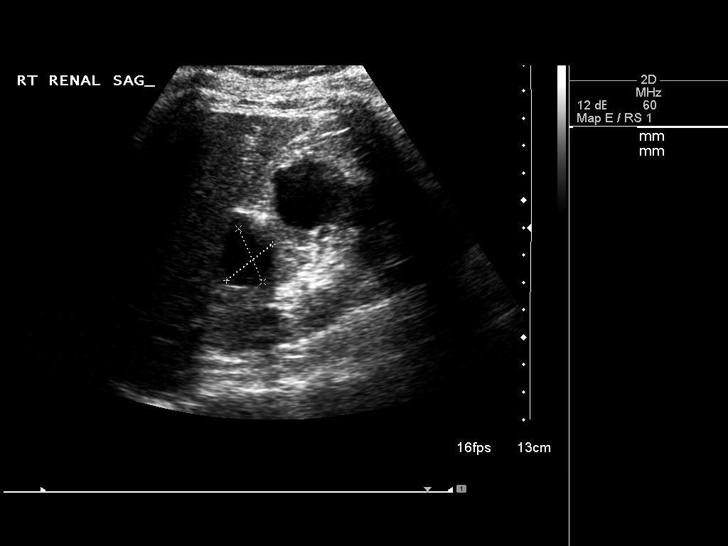
[im 11/62]
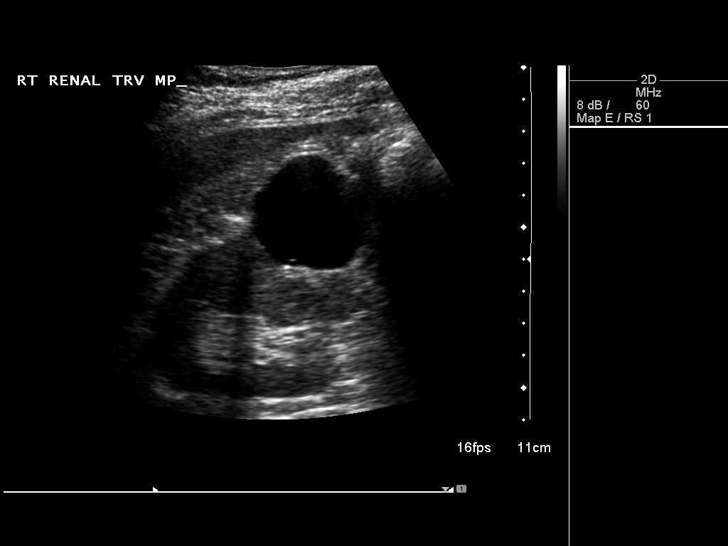
[im 16/62]
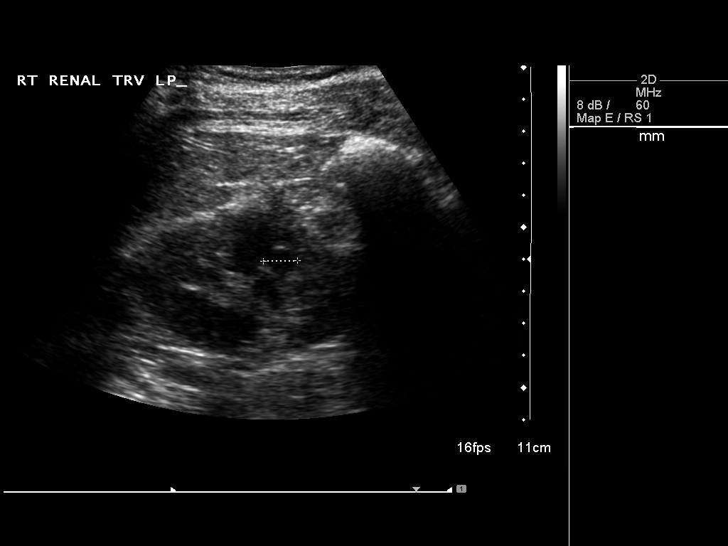
[im 21/62]
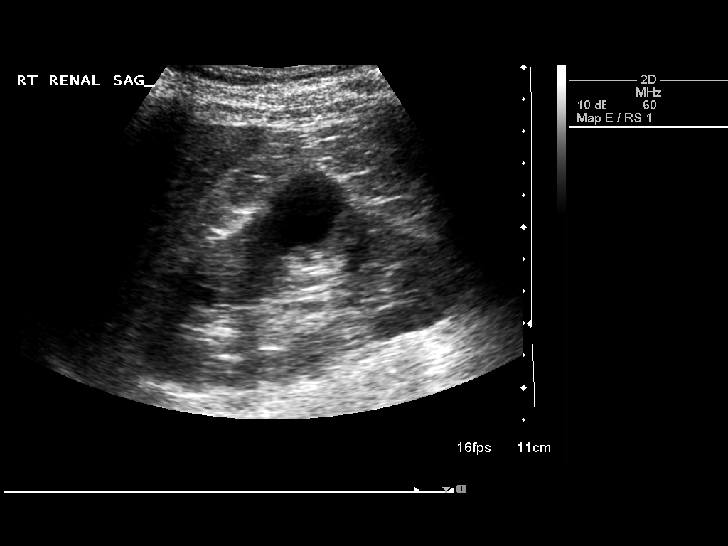
[im 23/62]
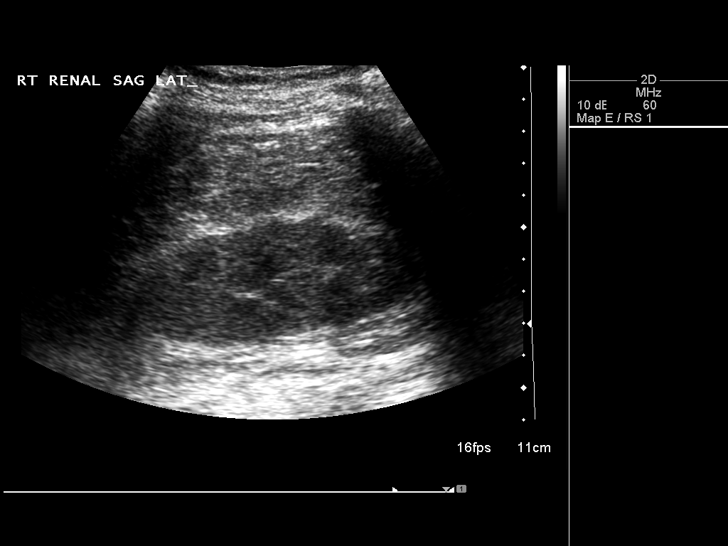
[im 28/62]
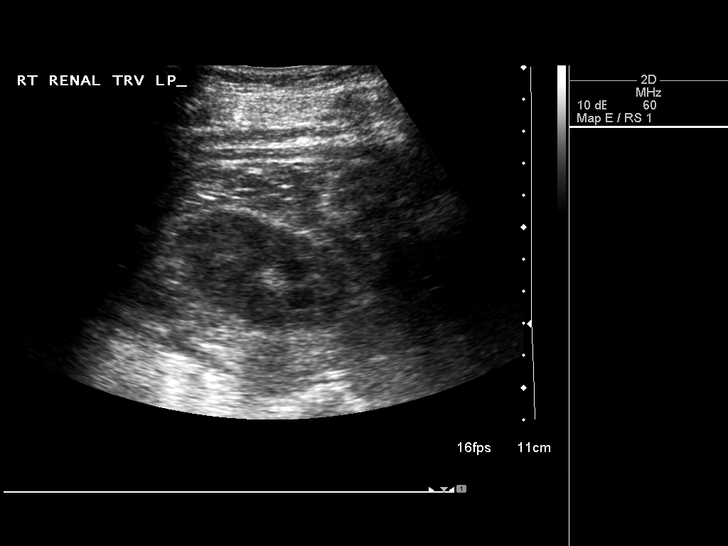
[im 34/62]
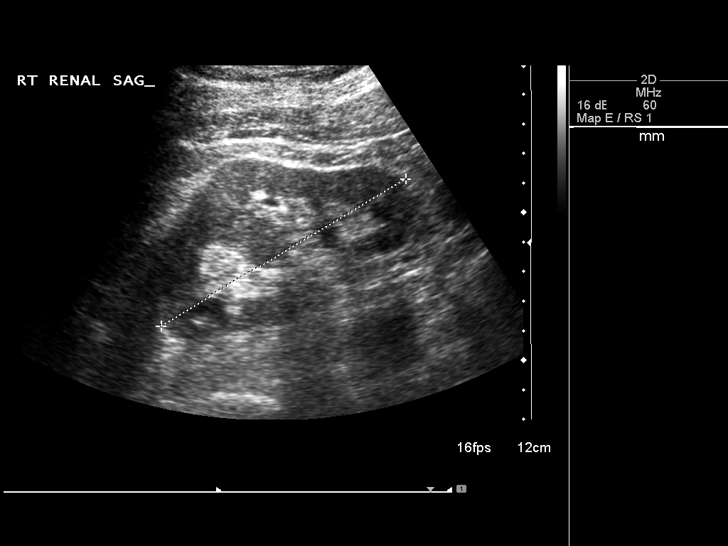
[im 39/62]
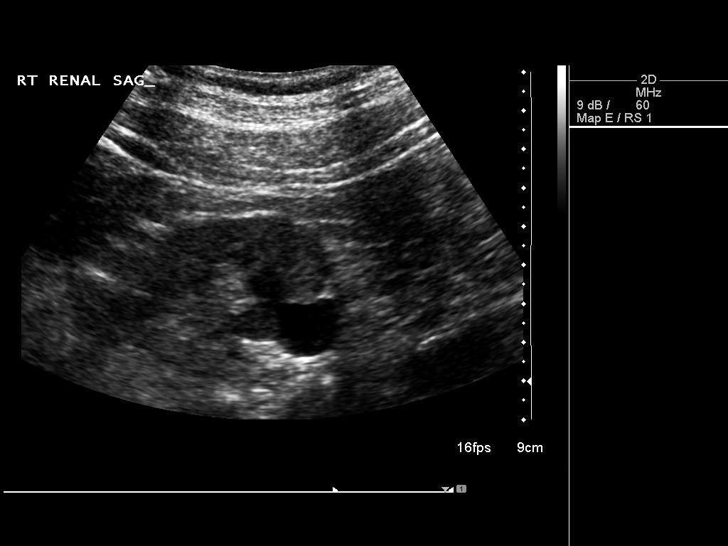
[im 41/62]
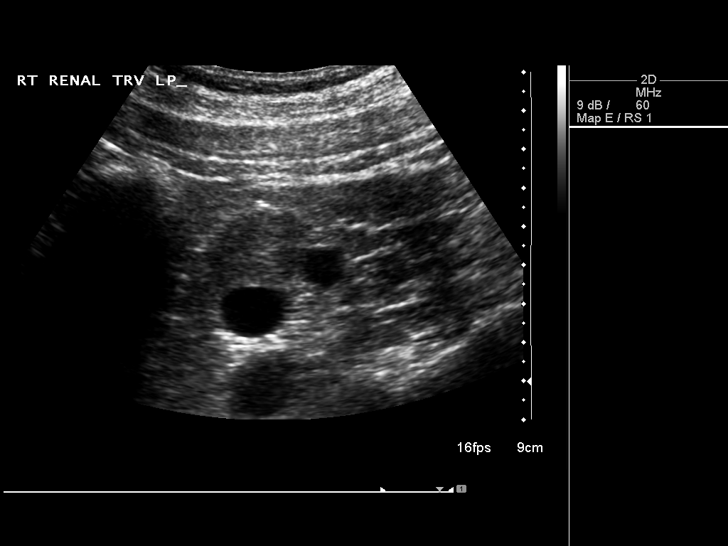
[im 46/62]
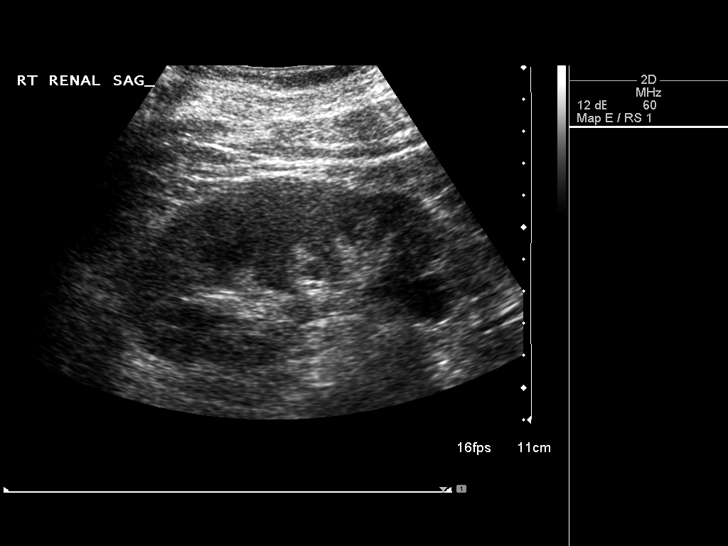
[im 51/62]
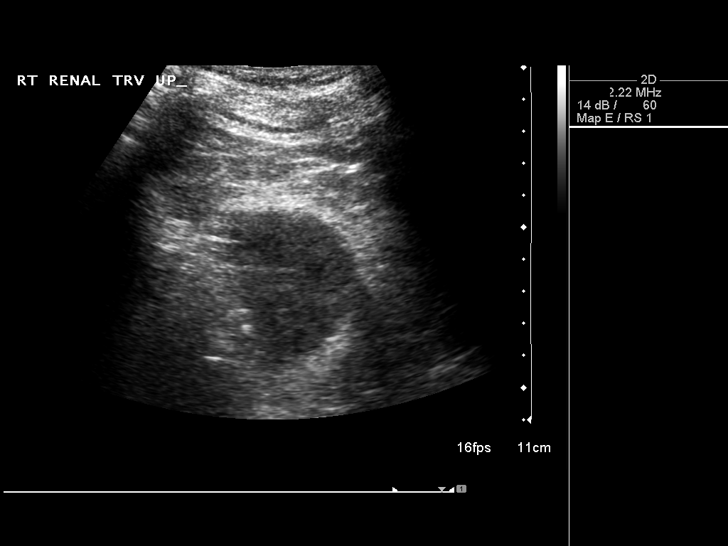
[im 56/62]
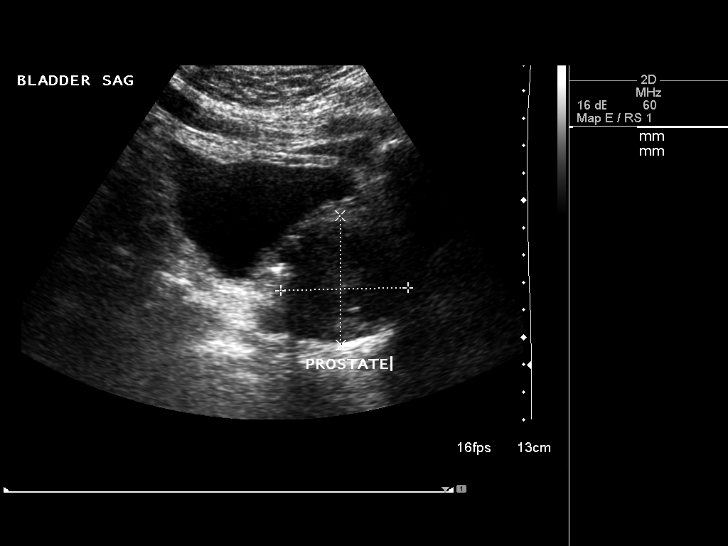
[im 62/62]
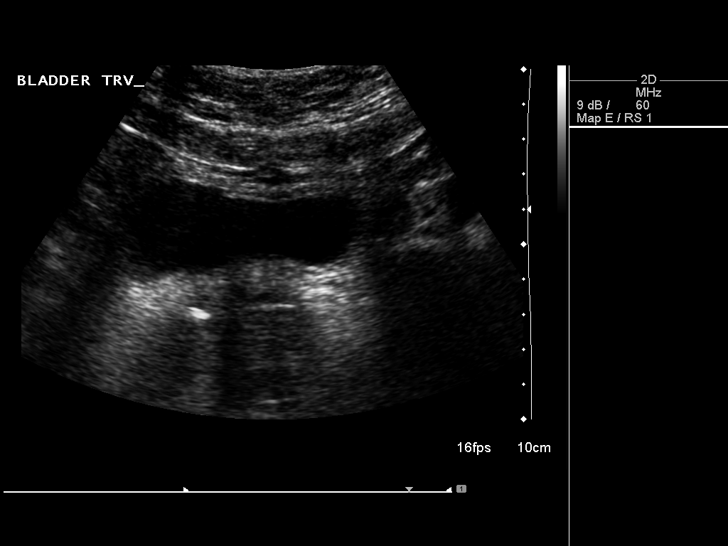

[14 of 25 positions shown; findings below may reference images not displayed]

FINDINGS: Right Kidney:  No hydronephrosis is seen.  The right kidney
measures 10.5 cm sagittally.  The parenchyma of the right kidney is
somewhat echogenic.  Multiple right renal cysts are present.  The
largest is in the mid right kidney laterally of 3.6 x 3.8 x 3.7 cm.

Left Kidney:  No hydronephrosis is noted.  The left kidney measures
10.5 cm.  The left renal parenchyma also is somewhat echogenic
suggesting chronic renal medical disease.  Cysts are present in the
lower pole with the largest measuring 1.9 x 1.8 x 1.8 cm.

Bladder:  The urinary bladder is unremarkable although the prostate
is prominent measuring 5.8 cm transversely.
IMPRESSION: 1.  No hydronephrosis.
2.  Echogenic renal parenchyma consistent with chronic renal
medical disease.
3.  Bilateral renal cysts.
4.  Prominent prostate.

## 2015-01-09 IMAGING — US US SOFT TISSUE HEAD/NECK
1 series · 13 of 25 positions shown · non-contrast
Comparison: None.

CLINICAL DATA: Question enlarged thyroid on physical exam

THYROID ULTRASOUND
TECHNIQUE: Ultrasound examination of the thyroid gland and adjacent
soft tissues was performed.

[Series 1: us soft tissue head/neck · 0.09mm/px · 13 of 60 slices shown]
[im 1/60]
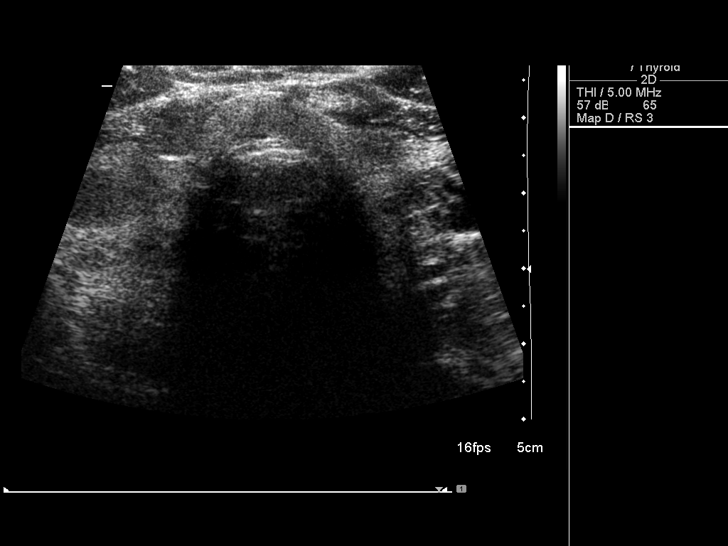
[im 5/60]
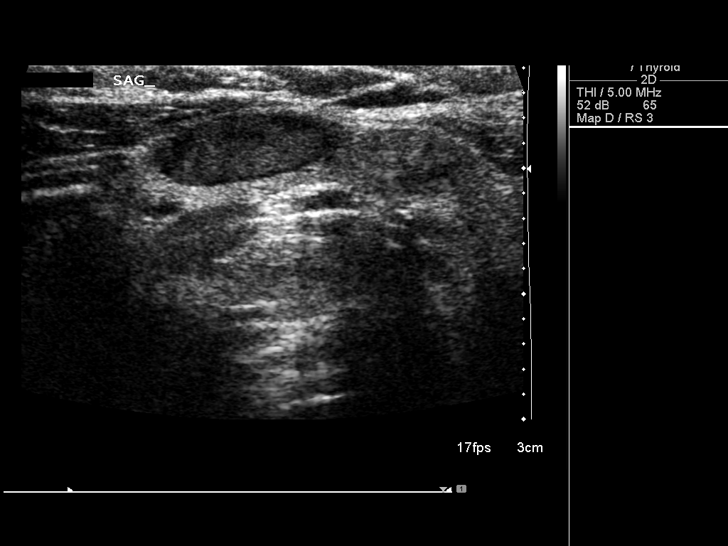
[im 10/60]
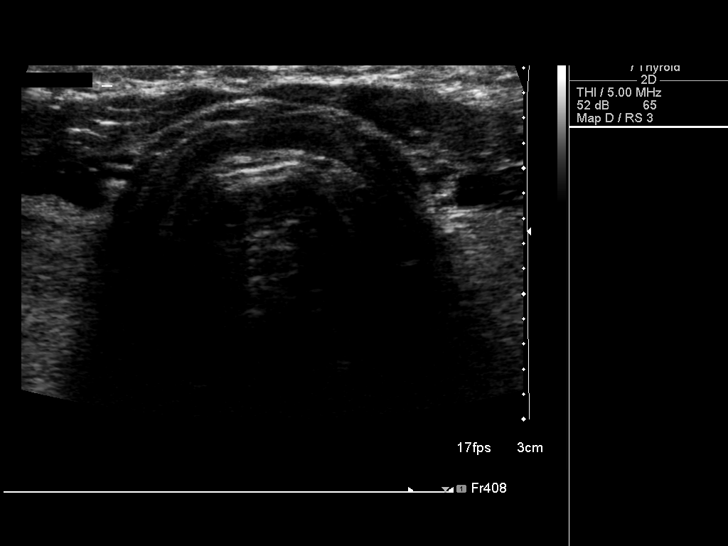
[im 15/60]
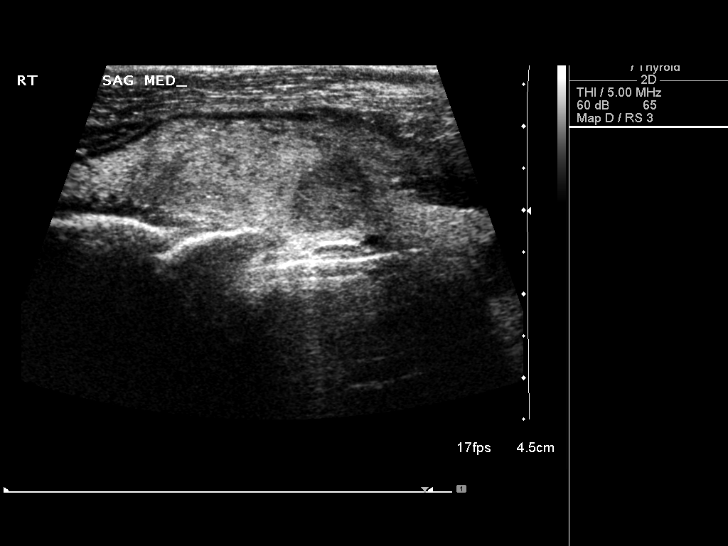
[im 20/60]
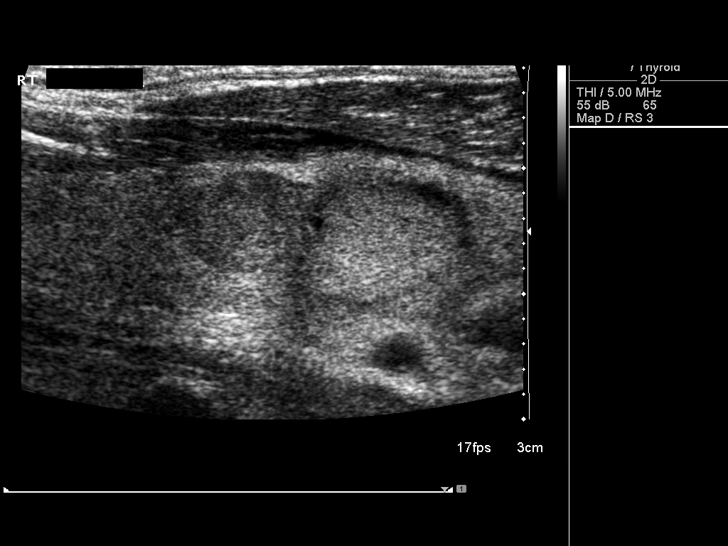
[im 25/60]
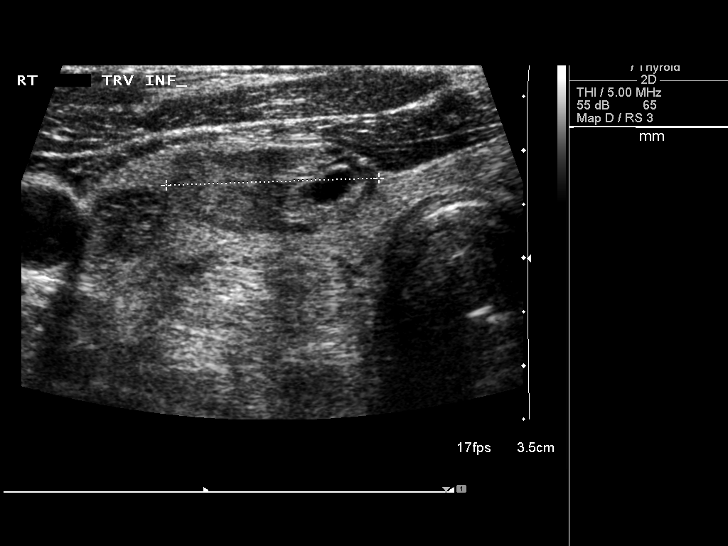
[im 30/60]
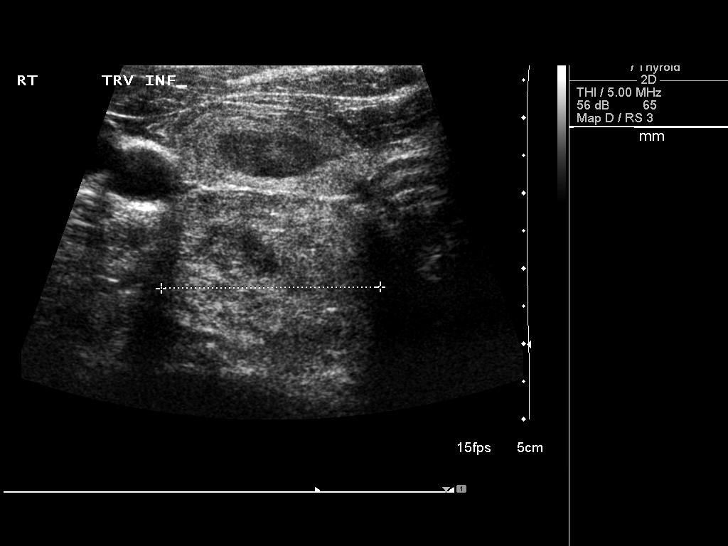
[im 35/60]
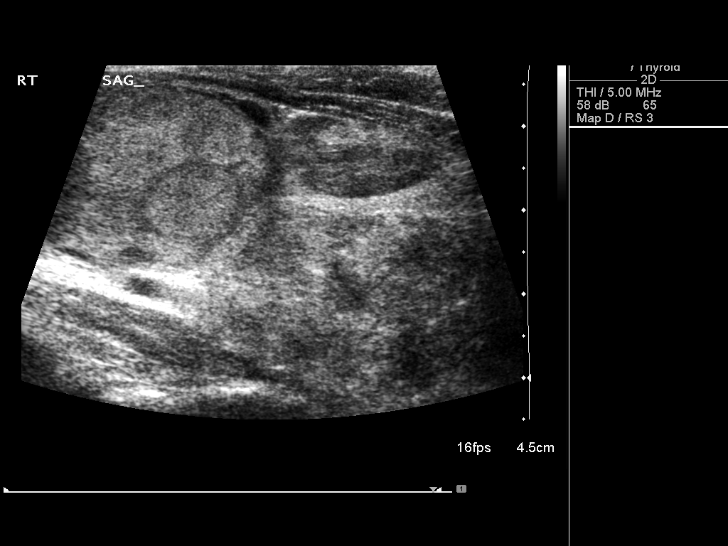
[im 40/60]
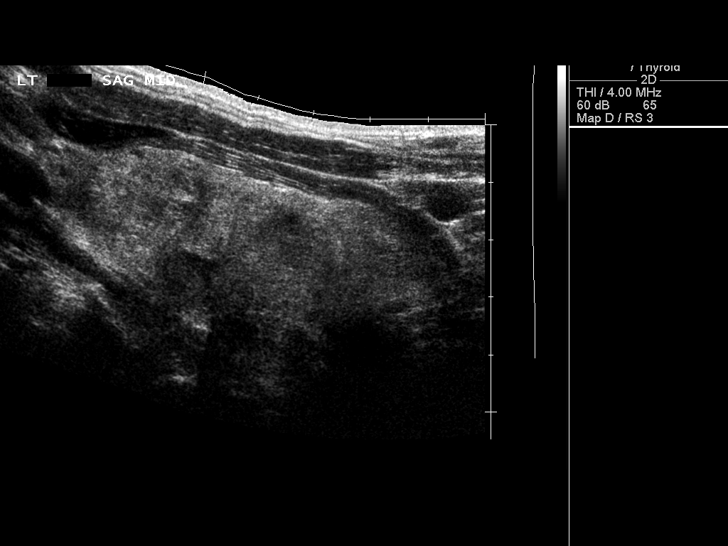
[im 45/60]
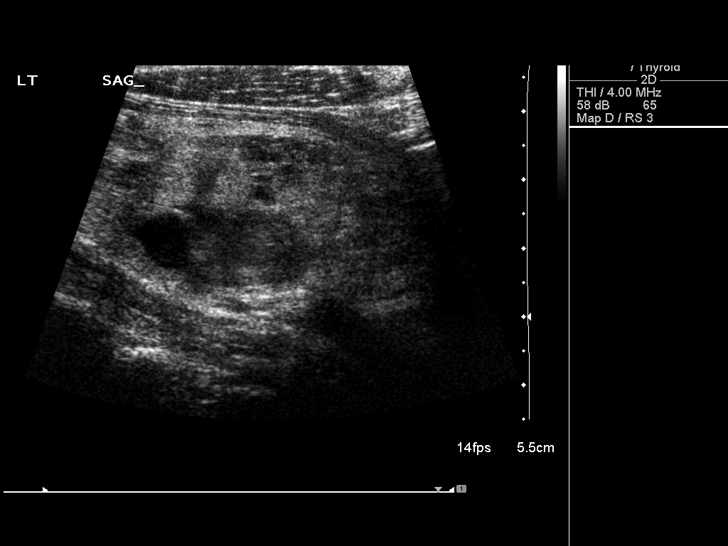
[im 50/60]
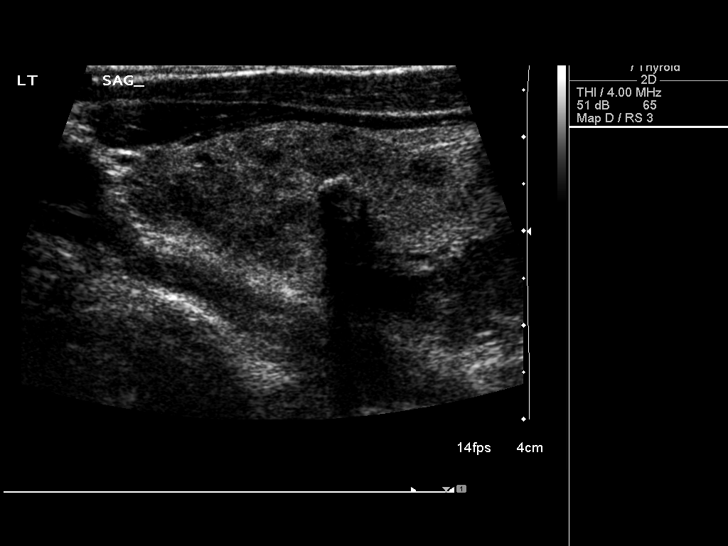
[im 55/60]
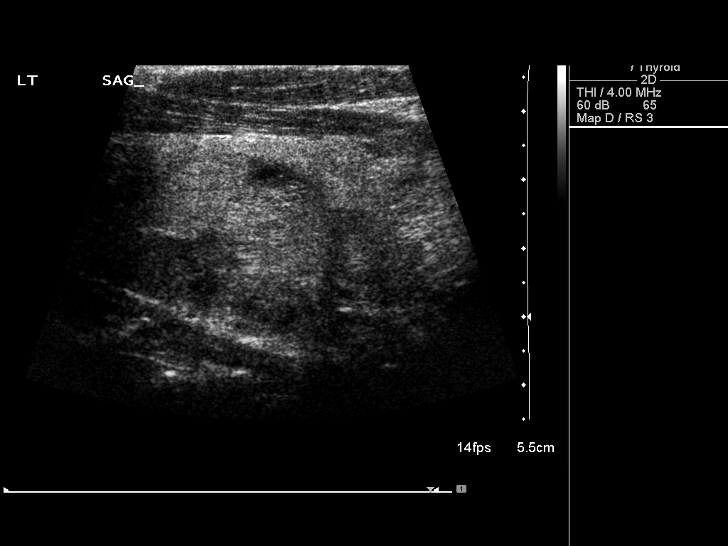
[im 60/60]
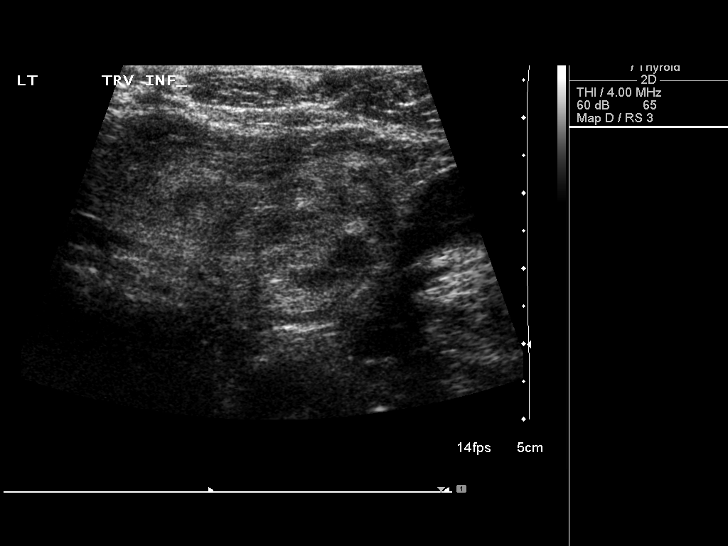

[13 of 25 positions shown; findings below may reference images not displayed]

FINDINGS: Right thyroid lobe:  7.2 x 3.4 x 3.0 cm.
Left thyroid lobe:  7.8 x 3.0 x 3.6 cm.
Isthmus:  6 mm in thickness.

Focal nodules:  The echogenicity of the thyroid gland is
inhomogeneous.  Multiple nodules present bilaterally which are
solid.  The largest solid nodule is in the lower pole of the left
lobe measuring 4.9 x 2.4 x 3.1 cm.  A solid nodule in the upper
pole of the left lobe with calcifications measures 2.6 x 1.7 x
cm.

The largest nodule on the right is in the upper pole measuring
x 2.3 x 2.9 cm.  A solid nodule in the lower pole on the right
measures 3.2 x 2.1 x 2.9 cm.  A 2-cm nodule is noted in the lower
pole on the right as well.  Additional smaller nodules are present
of no more than 14 mm in diameter.

Lymphadenopathy:  None visualized.
IMPRESSION: 1.  Multiple thyroid nodules, many of which do meet criteria for
thyroid biopsy. Findings meet consensus criteria for biopsy.
Ultrasound-guided fine needle aspiration should be considered, as
per the consensus statement: Management of Thyroid Nodules Detected
at US:  Society of Radiologists in Ultrasound Consensus Conference
2.  Diffuse enlargement of the thyroid gland.

## 2015-01-16 ENCOUNTER — Other Ambulatory Visit: Payer: Self-pay

## 2015-01-16 ENCOUNTER — Telehealth: Payer: Self-pay | Admitting: Internal Medicine

## 2015-01-16 MED ORDER — SIMVASTATIN 20 MG PO TABS: 20.0000 mg | ORAL_TABLET | Freq: Every day | ORAL | Status: AC

## 2015-01-16 NOTE — Telephone Encounter (Signed)
Simvastatin refilled to [facility] as requested.

## 2015-01-16 NOTE — Telephone Encounter (Signed)
Caller name: Gustavus Relation to pt: self Call back number: [phone] Pharmacy: [facility] [address]  Reason for call:   Requesting refill of simvastatin

## 2015-01-22 DIAGNOSIS — D490 Neoplasm of unspecified behavior of digestive system: Secondary | ICD-10-CM | POA: Insufficient documentation

## 2015-01-28 ENCOUNTER — Telehealth: Payer: Self-pay | Admitting: Internal Medicine

## 2015-01-28 NOTE — Telephone Encounter (Signed)
Saw surgery Dr. Raul Del ~ 11-2014 . Please call   the surgeon's  office and get the records from the  visit

## 2015-01-29 ENCOUNTER — Other Ambulatory Visit (HOSPITAL_COMMUNITY): Payer: Self-pay

## 2015-01-29 ENCOUNTER — Other Ambulatory Visit: Payer: Self-pay

## 2015-01-29 NOTE — Telephone Encounter (Signed)
Faxed medical records request to Dr. Brigitte Pulse office at [phone].

## 2015-02-05 ENCOUNTER — Telehealth: Payer: Self-pay | Admitting: Internal Medicine

## 2015-02-05 NOTE — Telephone Encounter (Signed)
Spoke with PT and PT wife- concerned about visit with Demetrius Revel MD- last week. PT claims was given wrong paperwork at time of appointment- apologized and informed that if complaint with that office- she would need to speak with their office. Contacted office of Dr. Raul Del and left message with direct dial.

## 2015-02-26 ENCOUNTER — Encounter (HOSPITAL_COMMUNITY): Payer: Self-pay | Admitting: Emergency Medicine

## 2015-02-26 ENCOUNTER — Emergency Department (HOSPITAL_COMMUNITY): Payer: PPO

## 2015-02-26 ENCOUNTER — Telehealth: Payer: Self-pay | Admitting: Internal Medicine

## 2015-02-26 ENCOUNTER — Inpatient Hospital Stay (HOSPITAL_COMMUNITY)
Admission: EM | Admit: 2015-02-26 | Discharge: 2015-03-25 | DRG: 856 | Disposition: E | Payer: PPO | Attending: Emergency Medicine | Admitting: Emergency Medicine

## 2015-02-26 ENCOUNTER — Inpatient Hospital Stay (HOSPITAL_COMMUNITY): Payer: PPO

## 2015-02-26 DIAGNOSIS — K659 Peritonitis, unspecified: Secondary | ICD-10-CM | POA: Diagnosis present

## 2015-02-26 DIAGNOSIS — T884XXA Failed or difficult intubation, initial encounter: Secondary | ICD-10-CM

## 2015-02-26 DIAGNOSIS — Z903 Acquired absence of stomach [part of]: Secondary | ICD-10-CM | POA: Diagnosis present

## 2015-02-26 DIAGNOSIS — B49 Unspecified mycosis: Secondary | ICD-10-CM | POA: Diagnosis present

## 2015-02-26 DIAGNOSIS — N179 Acute kidney failure, unspecified: Secondary | ICD-10-CM | POA: Diagnosis present

## 2015-02-26 DIAGNOSIS — A419 Sepsis, unspecified organism: Secondary | ICD-10-CM | POA: Diagnosis present

## 2015-02-26 DIAGNOSIS — R6521 Severe sepsis with septic shock: Secondary | ICD-10-CM | POA: Diagnosis present

## 2015-02-26 DIAGNOSIS — R0602 Shortness of breath: Secondary | ICD-10-CM

## 2015-02-26 DIAGNOSIS — K650 Generalized (acute) peritonitis: Secondary | ICD-10-CM | POA: Diagnosis present

## 2015-02-26 DIAGNOSIS — J9601 Acute respiratory failure with hypoxia: Secondary | ICD-10-CM | POA: Diagnosis not present

## 2015-02-26 DIAGNOSIS — I5032 Chronic diastolic (congestive) heart failure: Secondary | ICD-10-CM | POA: Diagnosis present

## 2015-02-26 DIAGNOSIS — I469 Cardiac arrest, cause unspecified: Secondary | ICD-10-CM | POA: Diagnosis not present

## 2015-02-26 DIAGNOSIS — J9600 Acute respiratory failure, unspecified whether with hypoxia or hypercapnia: Secondary | ICD-10-CM | POA: Diagnosis not present

## 2015-02-26 DIAGNOSIS — Z833 Family history of diabetes mellitus: Secondary | ICD-10-CM | POA: Diagnosis not present

## 2015-02-26 DIAGNOSIS — I10 Essential (primary) hypertension: Secondary | ICD-10-CM | POA: Diagnosis present

## 2015-02-26 DIAGNOSIS — R0902 Hypoxemia: Secondary | ICD-10-CM

## 2015-02-26 DIAGNOSIS — G9349 Other encephalopathy: Secondary | ICD-10-CM | POA: Diagnosis not present

## 2015-02-26 DIAGNOSIS — E162 Hypoglycemia, unspecified: Secondary | ICD-10-CM | POA: Diagnosis not present

## 2015-02-26 DIAGNOSIS — Z8546 Personal history of malignant neoplasm of prostate: Secondary | ICD-10-CM

## 2015-02-26 DIAGNOSIS — N390 Urinary tract infection, site not specified: Secondary | ICD-10-CM | POA: Diagnosis present

## 2015-02-26 DIAGNOSIS — D696 Thrombocytopenia, unspecified: Secondary | ICD-10-CM | POA: Diagnosis present

## 2015-02-26 DIAGNOSIS — Z820 Family history of epilepsy and other diseases of the nervous system: Secondary | ICD-10-CM | POA: Diagnosis not present

## 2015-02-26 DIAGNOSIS — T814XXA Infection following a procedure, initial encounter: Secondary | ICD-10-CM | POA: Diagnosis present

## 2015-02-26 DIAGNOSIS — Y95 Nosocomial condition: Secondary | ICD-10-CM

## 2015-02-26 DIAGNOSIS — R0600 Dyspnea, unspecified: Secondary | ICD-10-CM | POA: Diagnosis not present

## 2015-02-26 DIAGNOSIS — E872 Acidosis: Secondary | ICD-10-CM | POA: Diagnosis present

## 2015-02-26 DIAGNOSIS — I4891 Unspecified atrial fibrillation: Secondary | ICD-10-CM | POA: Diagnosis present

## 2015-02-26 DIAGNOSIS — E042 Nontoxic multinodular goiter: Secondary | ICD-10-CM | POA: Diagnosis present

## 2015-02-26 DIAGNOSIS — E039 Hypothyroidism, unspecified: Secondary | ICD-10-CM | POA: Diagnosis present

## 2015-02-26 DIAGNOSIS — T8143XA Infection following a procedure, organ and space surgical site, initial encounter: Secondary | ICD-10-CM | POA: Diagnosis present

## 2015-02-26 DIAGNOSIS — G253 Myoclonus: Secondary | ICD-10-CM | POA: Diagnosis present

## 2015-02-26 DIAGNOSIS — G931 Anoxic brain damage, not elsewhere classified: Secondary | ICD-10-CM | POA: Diagnosis not present

## 2015-02-26 DIAGNOSIS — M1990 Unspecified osteoarthritis, unspecified site: Secondary | ICD-10-CM | POA: Diagnosis present

## 2015-02-26 DIAGNOSIS — C49A2 Gastrointestinal stromal tumor of stomach: Secondary | ICD-10-CM | POA: Diagnosis present

## 2015-02-26 DIAGNOSIS — E785 Hyperlipidemia, unspecified: Secondary | ICD-10-CM | POA: Diagnosis present

## 2015-02-26 DIAGNOSIS — E871 Hypo-osmolality and hyponatremia: Secondary | ICD-10-CM | POA: Diagnosis present

## 2015-02-26 DIAGNOSIS — D649 Anemia, unspecified: Secondary | ICD-10-CM | POA: Diagnosis present

## 2015-02-26 DIAGNOSIS — K651 Peritoneal abscess: Secondary | ICD-10-CM | POA: Diagnosis present

## 2015-02-26 DIAGNOSIS — T8149XA Infection following a procedure, other surgical site, initial encounter: Secondary | ICD-10-CM

## 2015-02-26 DIAGNOSIS — J189 Pneumonia, unspecified organism: Secondary | ICD-10-CM

## 2015-02-26 DIAGNOSIS — J690 Pneumonitis due to inhalation of food and vomit: Secondary | ICD-10-CM | POA: Diagnosis present

## 2015-02-26 DIAGNOSIS — T814XXS Infection following a procedure, sequela: Secondary | ICD-10-CM | POA: Diagnosis not present

## 2015-02-26 DIAGNOSIS — Z515 Encounter for palliative care: Secondary | ICD-10-CM

## 2015-02-26 DIAGNOSIS — T8130XA Disruption of wound, unspecified, initial encounter: Secondary | ICD-10-CM | POA: Diagnosis present

## 2015-02-26 DIAGNOSIS — D481 Neoplasm of uncertain behavior of connective and other soft tissue: Secondary | ICD-10-CM | POA: Diagnosis present

## 2015-02-26 DIAGNOSIS — R140 Abdominal distension (gaseous): Secondary | ICD-10-CM

## 2015-02-26 HISTORY — DX: Gastrointestinal stromal tumor, unspecified site: C49.A0

## 2015-02-26 LAB — BLOOD GAS, ARTERIAL
Acid-base deficit: 8.7 mmol/L — ABNORMAL HIGH (ref 0.0–2.0)
BICARBONATE: 14.2 meq/L — AB (ref 20.0–24.0)
DRAWN BY: 295031
O2 Content: 2 L/min
O2 Saturation: 90.4 %
Patient temperature: 98.6
TCO2: 12.9 mmol/L (ref 0–100)
pCO2 arterial: 23.1 mmHg — ABNORMAL LOW (ref 35.0–45.0)
pH, Arterial: 7.405 (ref 7.350–7.450)
pO2, Arterial: 64.3 mmHg — ABNORMAL LOW (ref 80.0–100.0)

## 2015-02-26 LAB — BASIC METABOLIC PANEL
Anion gap: 21 — ABNORMAL HIGH (ref 5–15)
BUN: 62 mg/dL — ABNORMAL HIGH (ref 6–23)
CALCIUM: 8.2 mg/dL — AB (ref 8.4–10.5)
CO2: 17 mmol/L — AB (ref 19–32)
CREATININE: 5.34 mg/dL — AB (ref 0.50–1.35)
Chloride: 98 mmol/L (ref 96–112)
GFR calc non Af Amer: 10 mL/min — ABNORMAL LOW (ref >90)
GFR, EST AFRICAN AMERICAN: 11 mL/min — AB (ref >90)
Glucose, Bld: 118 mg/dL — ABNORMAL HIGH (ref 70–99)
Potassium: 4.5 mmol/L (ref 3.5–5.1)
Sodium: 136 mmol/L (ref 135–145)

## 2015-02-26 LAB — TROPONIN I: Troponin I: 0.04 ng/mL — ABNORMAL HIGH (ref <0.031)

## 2015-02-26 LAB — CBC
HCT: 38.3 % — ABNORMAL LOW (ref 39.0–52.0)
Hemoglobin: 13.1 g/dL (ref 13.0–17.0)
MCH: 31.6 pg (ref 26.0–34.0)
MCHC: 34.2 g/dL (ref 30.0–36.0)
MCV: 92.5 fL (ref 78.0–100.0)
PLATELETS: 522 10*3/uL — AB (ref 150–400)
RBC: 4.14 MIL/uL — AB (ref 4.22–5.81)
RDW: 13.2 % (ref 11.5–15.5)
WBC: 11.8 10*3/uL — ABNORMAL HIGH (ref 4.0–10.5)

## 2015-02-26 LAB — TSH: TSH: 6.37 u[IU]/mL — ABNORMAL HIGH (ref 0.350–4.500)

## 2015-02-26 LAB — MAGNESIUM: MAGNESIUM: 2.2 mg/dL (ref 1.5–2.5)

## 2015-02-26 LAB — I-STAT TROPONIN, ED: Troponin i, poc: 0.04 ng/mL (ref 0.00–0.08)

## 2015-02-26 LAB — BRAIN NATRIURETIC PEPTIDE: B Natriuretic Peptide: 116.9 pg/mL — ABNORMAL HIGH (ref 0.0–100.0)

## 2015-02-26 LAB — I-STAT CG4 LACTIC ACID, ED: Lactic Acid, Venous: 5.51 mmol/L (ref 0.5–2.0)

## 2015-02-26 LAB — PHOSPHORUS: PHOSPHORUS: 5.2 mg/dL — AB (ref 2.3–4.6)

## 2015-02-26 MED ORDER — SODIUM CHLORIDE 0.9 % IV SOLN
Freq: Once | INTRAVENOUS | Status: AC
  Administered 2015-02-26: 21:00:00 via INTRAVENOUS

## 2015-02-26 MED ORDER — SODIUM CHLORIDE 0.9 % IV SOLN
INTRAVENOUS | Status: AC
  Administered 2015-02-27: 19:00:00 via INTRAVENOUS
  Administered 2015-02-27 (×2): 1000 mL via INTRAVENOUS

## 2015-02-26 MED ORDER — LEVOFLOXACIN IN D5W 500 MG/100ML IV SOLN: 500.0000 mg | INTRAVENOUS | Status: DC

## 2015-02-26 MED ORDER — DEXTROSE 5 % IV SOLN
2.0000 g | Freq: Once | INTRAVENOUS | Status: AC
  Administered 2015-02-26: 2 g via INTRAVENOUS
  Filled 2015-02-26: qty 2

## 2015-02-26 MED ORDER — CARVEDILOL 12.5 MG PO TABS
25.0000 mg | ORAL_TABLET | Freq: Two times a day (BID) | ORAL | Status: DC
  Filled 2015-02-26: qty 2

## 2015-02-26 MED ORDER — TECHNETIUM TC 99M DIETHYLENETRIAME-PENTAACETIC ACID: 40.0000 | Freq: Once | INTRAVENOUS | Status: AC | PRN

## 2015-02-26 MED ORDER — SODIUM CHLORIDE 0.9 % IJ SOLN
3.0000 mL | Freq: Two times a day (BID) | INTRAMUSCULAR | Status: DC
  Administered 2015-02-27: 3 mL via INTRAVENOUS

## 2015-02-26 MED ORDER — HEPARIN SODIUM (PORCINE) 5000 UNIT/ML IJ SOLN
5000.0000 [IU] | Freq: Three times a day (TID) | INTRAMUSCULAR | Status: DC
  Administered 2015-02-26 – 2015-02-27 (×3): 5000 [IU] via SUBCUTANEOUS
  Filled 2015-02-26 (×3): qty 1

## 2015-02-26 MED ORDER — ACETAMINOPHEN 650 MG RE SUPP: 650.0000 mg | Freq: Four times a day (QID) | RECTAL | Status: DC | PRN

## 2015-02-26 MED ORDER — LEVOTHYROXINE SODIUM 75 MCG PO TABS
125.0000 ug | ORAL_TABLET | Freq: Every day | ORAL | Status: DC
  Administered 2015-02-27: 125 ug via ORAL
  Filled 2015-02-26 (×2): qty 1

## 2015-02-26 MED ORDER — SODIUM CHLORIDE 0.9 % IV BOLUS (SEPSIS)
2000.0000 mL | Freq: Once | INTRAVENOUS | Status: AC
  Administered 2015-02-26: 2000 mL via INTRAVENOUS

## 2015-02-26 MED ORDER — TAMSULOSIN HCL 0.4 MG PO CAPS
0.4000 mg | ORAL_CAPSULE | Freq: Every day | ORAL | Status: DC
  Administered 2015-02-26: 0.4 mg via ORAL
  Filled 2015-02-26: qty 1

## 2015-02-26 MED ORDER — ACETAMINOPHEN 325 MG PO TABS
650.0000 mg | ORAL_TABLET | Freq: Four times a day (QID) | ORAL | Status: DC | PRN
  Administered 2015-02-26: 650 mg via ORAL
  Filled 2015-02-26: qty 2

## 2015-02-26 MED ORDER — SIMVASTATIN 10 MG PO TABS
20.0000 mg | ORAL_TABLET | Freq: Every day | ORAL | Status: DC
  Administered 2015-02-26: 20 mg via ORAL
  Filled 2015-02-26: qty 2

## 2015-02-26 MED ORDER — TECHNETIUM TO 99M ALBUMIN AGGREGATED
5.0000 | Freq: Once | INTRAVENOUS | Status: AC | PRN
  Administered 2015-02-26: 5 via INTRAVENOUS

## 2015-02-26 MED ORDER — SODIUM CHLORIDE 0.9 % IV BOLUS (SEPSIS)
500.0000 mL | Freq: Once | INTRAVENOUS | Status: AC
  Administered 2015-02-26: 500 mL via INTRAVENOUS

## 2015-02-26 MED ORDER — LEVOFLOXACIN IN D5W 750 MG/150ML IV SOLN
750.0000 mg | Freq: Once | INTRAVENOUS | Status: AC
  Administered 2015-02-26: 750 mg via INTRAVENOUS
  Filled 2015-02-26: qty 150

## 2015-02-26 MED ORDER — VANCOMYCIN HCL IN DEXTROSE 1-5 GM/200ML-% IV SOLN
1000.0000 mg | INTRAVENOUS | Status: AC
  Administered 2015-02-26: 1000 mg via INTRAVENOUS
  Filled 2015-02-26: qty 200

## 2015-02-26 MED ORDER — FLUTICASONE PROPIONATE 50 MCG/ACT NA SUSP
2.0000 | Freq: Every day | NASAL | Status: DC
  Filled 2015-02-26: qty 16

## 2015-02-26 NOTE — ED Notes (Signed)
Dr Jeneen Rinks notified of patients lab result.

## 2015-02-26 NOTE — H&P (Signed)
[name] Fife Sr. is an 74 y.o. male.    [place] (pcp)  Chief Complaint: dyspnea HPI: 74 yo male with hypertension, hyperlipidemia, recent resection of GIST apparently c/o dyspnea worsening over the past day. +chills,   Denies fever, cough, cp, palp, n/v, diarrhea, brbpr, black stool.   Pt was brought to ED for evaluation and found to have L pneumonia, and also ARF with creatinine 5.34.  Pt will be admitted.    Past Medical History  Diagnosis Date  . Allergy     rhinitis  . Hyperlipidemia   . Hypertension   . Osteoarthritis   . Erectile dysfunction   . Overweight(278.02)   . Renal insufficiency     baseline cr 1.5 11-06; cr 1.3 on 5-02  . Prostate cancer 2-11    prostate  . Prostate ca 2013    seeding implant  . 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 Gastrointestinal stromal tumor (GIST)     Past Surgical History  Procedure Laterality Date  . Tonsillectomy  1967  . Radioactive seed implant  ~2012  . Hernia repair  2009    right  . Thyroidectomy N/A 06/03/2013    Procedure: TOTAL THYROIDECTOMY;  Surgeon: Earnstine Regal, MD;  Location: [facility];  Service: General;  Laterality: N/A;  . Stomach surgery  2016    removal of GIST    Family History  Problem Relation Age of Onset  . Alzheimer's disease Mother   . Diabetes Father   . Kidney disease Father   . Cancer Sister     ? type  . Colon cancer Neg Hx   . Prostate cancer Neg Hx   . CAD Neg Hx    Social History:  reports that he has never smoked. He has never used smokeless tobacco. He reports that he does not drink alcohol or use illicit drugs.  Allergies: No Known Allergies Medications reviewed  (Not in a hospital admission)  Results for orders placed or performed during the hospital encounter of 02/25/2015 (from the past 48 hour(s))  CBC     Status: Abnormal   Collection Time: 03/06/2015  3:39 PM  Result Value Ref Range   WBC 11.8 (H) 4.0 - 10.5 K/uL   RBC 4.14 (L) 4.22 - 5.81 MIL/uL   Hemoglobin  13.1 13.0 - 17.0 g/dL   HCT 38.3 (L) 39.0 - 52.0 %   MCV 92.5 78.0 - 100.0 fL   MCH 31.6 26.0 - 34.0 pg   MCHC 34.2 30.0 - 36.0 g/dL   RDW 13.2 11.5 - 15.5 %   Platelets 522 (H) 150 - 400 K/uL  Basic metabolic panel     Status: Abnormal   Collection Time: 03/17/2015  3:39 PM  Result Value Ref Range   Sodium 136 135 - 145 mmol/L    Comment: REPEATED TO VERIFY   Potassium 4.5 3.5 - 5.1 mmol/L    Comment: REPEATED TO VERIFY   Chloride 98 96 - 112 mmol/L    Comment: REPEATED TO VERIFY   CO2 17 (L) 19 - 32 mmol/L    Comment: REPEATED TO VERIFY   Glucose, Bld 118 (H) 70 - 99 mg/dL   BUN 62 (H) 6 - 23 mg/dL   Creatinine, Ser 5.34 (H) 0.50 - 1.35 mg/dL    Comment: REPEATED TO VERIFY   Calcium 8.2 (L) 8.4 - 10.5 mg/dL    Comment: REPEATED TO VERIFY   GFR calc non Af Amer 10 (L) >  90 mL/min   GFR calc Af Amer 11 (L) >90 mL/min    Comment: (NOTE) The eGFR has been calculated using the CKD EPI equation. This calculation has not been validated in all clinical situations. eGFR's persistently <90 mL/min signify possible Chronic Kidney Disease.    Anion gap 21 (H) 5 - 15    Comment: REPEATED TO VERIFY  Brain natriuretic peptide (only with dyspnea)     Status: Abnormal   Collection Time: 02/25/2015  3:39 PM  Result Value Ref Range   B Natriuretic Peptide 116.9 (H) 0.0 - 100.0 pg/mL  I-stat troponin, ED (not at [facility])     Status: None   Collection Time: 03/18/2015  3:45 PM  Result Value Ref Range   Troponin i, poc 0.04 0.00 - 0.08 ng/mL   Comment 3            Comment: Due to the release kinetics of cTnI, a negative result within the first hours of the onset of symptoms does not rule out myocardial infarction with certainty. If myocardial infarction is still suspected, repeat the test at appropriate intervals.   I-Stat CG4 Lactic Acid, ED     Status: Abnormal   Collection Time: 03/21/2015  5:54 PM  Result Value Ref Range   Lactic Acid, Venous 5.51 (HH) 0.5 - 2.0 mmol/L   Comment NOTIFIED  PHYSICIAN   Blood gas, arterial     Status: Abnormal   Collection Time: 03/04/2015  5:56 PM  Result Value Ref Range   O2 Content 2.0 L/min   Delivery systems NASAL CANNULA    pH, Arterial 7.405 7.350 - 7.450   pCO2 arterial 23.1 (L) 35.0 - 45.0 mmHg   pO2, Arterial 64.3 (L) 80.0 - 100.0 mmHg   Bicarbonate 14.2 (L) 20.0 - 24.0 mEq/L   TCO2 12.9 0 - 100 mmol/L   Acid-base deficit 8.7 (H) 0.0 - 2.0 mmol/L   O2 Saturation 90.4 %   Patient temperature 98.6    Collection site RIGHT RADIAL    Drawn by [phone]    Sample type ARTERIAL DRAW    Allens test (pass/fail) PASS PASS   Dg Chest Port 1 View  03/21/2015   CLINICAL DATA:  Shortness of breath and hypertension  EXAM: PORTABLE CHEST - 1 VIEW  COMPARISON:  June 13, 2013  FINDINGS: There is airspace consolidation in the left base. Lungs elsewhere clear. Heart is upper normal in size with pulmonary vascularity within normal limits. No adenopathy. There is postoperative change in the lower neck region.  IMPRESSION: Left base pneumonia.   Electronically Signed   By: Lowella Grip III M.D.   On: 03/04/2015 15:52    Review of Systems  Constitutional: Positive for chills. Negative for fever, weight loss, malaise/fatigue and diaphoresis.  HENT: Negative.   Eyes: Negative.   Respiratory: Positive for shortness of breath. Negative for cough, hemoptysis, sputum production and wheezing.   Cardiovascular: Negative for chest pain, palpitations, orthopnea, claudication, leg swelling and PND.  Gastrointestinal: Negative.   Genitourinary: Negative.   Musculoskeletal: Negative.   Skin: Negative for itching and rash.  Neurological: Negative.  Negative for weakness.  Psychiatric/Behavioral: Negative.     Blood pressure 106/67, pulse 119, temperature 98.2 F (36.8 C), temperature source Oral, resp. rate 18, SpO2 92 %. Physical Exam  Constitutional: He is oriented to person, place, and time. He appears well-developed and well-nourished.  HENT:  Head:  Normocephalic and atraumatic.  Mouth/Throat: No oropharyngeal exudate.  Eyes: Conjunctivae and EOM are normal.  Pupils are equal, round, and reactive to light. No scleral icterus.  Neck: Normal range of motion. Neck supple. No JVD present. No tracheal deviation present. No thyromegaly present.  Cardiovascular: Normal rate and regular rhythm.  Exam reveals no gallop and no friction rub.   No murmur heard. Respiratory: Effort normal. No respiratory distress. He has no wheezes. He has rales. He exhibits no tenderness.  GI: Soft. Bowel sounds are normal. He exhibits no distension. There is no tenderness. There is no rebound and no guarding.  Musculoskeletal: Normal range of motion. He exhibits no edema or tenderness.  Lymphadenopathy:    He has no cervical adenopathy.  Neurological: He is alert and oriented to person, place, and time. He has normal reflexes. He displays normal reflexes. No cranial nerve deficit. He exhibits normal muscle tone. Coordination normal.  Skin: Skin is warm and dry. No rash noted. No erythema. No pallor.  Psychiatric: He has a normal mood and affect. His behavior is normal. Judgment and thought content normal.     Assessment/Plan Dyspnea Secondary to pneumonia tx CAP Might have early sepsis Blood culture x2 No sputum culture due to lack of cough.  Pharmacy consult for vanco, cefepime and levaquin pharmacy to dose  ARF Check urine sodium, urine creatinine, urine eosinophils Check renal ultrasound Foley Please consult nephrology Check cbc cmp in am  Hyperglycemia Check hga1c  Tachycardia Trop i q6hx3 Check tsh Hydrate with ns iv VQ scan pending  DVT prophylaxis: scd, heparin  Jani Gravel 03/03/2015, 6:43 PM

## 2015-02-26 NOTE — Telephone Encounter (Signed)
Patient arrived in [place] ED.

## 2015-02-26 NOTE — Telephone Encounter (Signed)
Patient Name: DENSIL OTTEY  DOB: February 21, 1941    Initial Comment caller states husband just came home yesterday from hosp and had surgery, he is now short of breath   Nurse Assessment  Nurse: Leilani Merl, RN, Nira Conn Date/Time (Eastern Time): 03/04/2015 2:27:10 PM  Confirm and document reason for call. If symptomatic, describe symptoms. ---caller states husband just came home yesterday from hosp and had surgery last Tuesday and went back to the hospital on Thursday for a bowel blockage, he is now short of breath, he is short of breath all the time.  Has the patient traveled out of the country within the last 30 days? ---Not Applicable  Does the patient require triage? ---Yes  Related visit to physician within the last 2 weeks? ---No  Does the PT have any chronic conditions? (i.e. diabetes, asthma, etc.) ---Unknown     Guidelines    Guideline Title Affirmed Question Affirmed Notes  Breathing Difficulty [1] MODERATE difficulty breathing (e.g., speaks in phrases, SOB even at rest, pulse 100-120) AND [2] NEW-onset or WORSE than normal    Final Disposition User   Go to ED Now Stand[place], RN, Nira Conn    Comments  His BP right now is 97/68 and his pulse is 126

## 2015-02-26 NOTE — Progress Notes (Addendum)
ANTIBIOTIC CONSULT NOTE - INITIAL  Pharmacy Consult for Vancomycin Indication: pneumonia  No Known Allergies  Patient Measurements:   Last documented weight 85.8 kg (10/18/14)  Vital Signs: Temp: 98.2 F (36.8 C) (04/04 1820) Temp Source: Oral (04/04 1820) BP: 106/67 mmHg (04/04 1800) Pulse Rate: 119 (04/04 1800) Intake/Output from previous day:    Labs:  Recent Labs  03/06/2015 1539  WBC 11.8*  HGB 13.1  PLT 522*  CREATININE 5.34*   CrCl cannot be calculated (Unknown ideal weight.).  No results for input(s): VANCOTROUGH, VANCOPEAK, VANCORANDOM, GENTTROUGH, GENTPEAK, GENTRANDOM, TOBRATROUGH, TOBRAPEAK, TOBRARND, AMIKACINPEAK, AMIKACINTROU, AMIKACIN in the last 72 hours.   Microbiology: No results found for this or any previous visit (from the past 720 hour(s)).  Medical History: Past Medical History  Diagnosis Date  . Allergy     rhinitis  . Hyperlipidemia   . Hypertension   . Osteoarthritis   . Erectile dysfunction   . Overweight(278.02)   . Renal insufficiency     baseline cr 1.5 11-06; cr 1.3 on 5-02  . Prostate cancer 2-11    prostate  . Prostate ca 2013    seeding implant  . 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 Gastrointestinal stromal tumor (GIST)     Assessment: 77 yoM admitted 4/4 with suspected pneumonia.CXR shows left lower pneumonia.He also has acute renal failure on admission.Pharmacy is consulted to dose Vancomycin.  4/4 >> Cefepime x1 4/4 >> Vanc >>  Today, 02/27/2015:  Tmax: 98.2  WBCs: 11.8  Renal: SCr 5.34 (Last SCr ~ 1.4 in 09/2014), CrCl ~ 14 ml/min  Lactic acid: 5.51  Blood cultures pending  Goal of Therapy:  Vancomycin trough level 15-20 mcg/ml  Plan:   Vancomycin 1g IV x1 dose  Pharmacy to f/u daily renal function and re-dose when appropriate.  Measure Vanc trough at steady state or earlier as needed.  Follow up renal fxn, culture results, and clinical course.   Gretta Arab PharmD,  BCPS Pager [phone] 03/08/2015 8:09 PM    Addendum:  Pharmacy to dose Levaquin. Plan: Levaquin 750mg  IV once, then 500mg  IV q48h. Follow up renal function and adjust dose as needed.  Gretta Arab PharmD, BCPS Pager [phone] 03/08/2015 10:17 PM

## 2015-02-26 NOTE — ED Notes (Signed)
Attempted to call report, primary nurse will call back. Pt has returned from nuclear medicine.

## 2015-02-26 NOTE — ED Notes (Signed)
Pt had abdominal surgery 02-20-15, experienced asystole on 02-21-15 and required resuscitation. Pt states he began feeling short of breath yesterday, pt's breathing is labored currently. Pt states his MD suspects pulmonary embolus.

## 2015-02-26 NOTE — ED Notes (Addendum)
Informed patient and family member the assigned room. Pt left for nuclear medicine. Estimated time 1.5 hrs. Patient appears in acute distress. Respirations were equal, regular, and unlabored. Skin warm and dry.

## 2015-02-26 NOTE — ED Provider Notes (Signed)
CSN: [id number]     Arrival date & time 03/24/2015  1519 History   First MD Initiated Contact with Patient 03/17/2015 1549     Chief Complaint  Patient presents with  . Shortness of Breath      HPI  Patient evaluation of difficult breathing. Sig get recent history for a laparotomy for resection of a GI stromal tumor with [facility]. Discharge Thursday, 4 days ago. Was home less than a few hours and return with ileus and persistent vomiting. When NG tube was placed developed SVT and given Identicard. Convalesced a few more days and was discharged yesterday. Took by mouth yesterday without difficulty. Continues to take by mouth today without nausea vomiting. Passing gas and normal bowel movements today. However he is more short of breath over last 24 hours and presents here. Denies chest pain, fever, hemoptysis. Short of breath with any exertion at home. No pleuritic pain or chest pain.  Past Medical History  Diagnosis Date  . Allergy     rhinitis  . Hyperlipidemia   . Hypertension   . Osteoarthritis   . Erectile dysfunction   . Overweight(278.02)   . Renal insufficiency     baseline cr 1.5 11-06; cr 1.3 on 5-02  . Prostate cancer 2-11    prostate  . Prostate ca 2013    seeding implant  . 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 Gastrointestinal stromal tumor (GIST)    Past Surgical History  Procedure Laterality Date  . Tonsillectomy  1967  . Radioactive seed implant  ~2012  . Hernia repair  2009    right  . Thyroidectomy N/A 06/03/2013    Procedure: TOTAL THYROIDECTOMY;  Surgeon: Earnstine Regal, MD;  Location: [facility];  Service: General;  Laterality: N/A;  . Stomach surgery  2016    removal of GIST   Family History  Problem Relation Age of Onset  . Alzheimer's disease Mother   . Diabetes Father   . Kidney disease Father   . Cancer Sister     ? type  . Colon cancer Neg Hx   . Prostate cancer Neg Hx   . CAD Neg Hx    History  Substance Use  Topics  . Smoking status: Never Smoker   . Smokeless tobacco: Never Used  . Alcohol Use: No     Comment: very rarely     Review of Systems  Constitutional: Negative for fever, chills, diaphoresis, appetite change and fatigue.  HENT: Negative for mouth sores, sore throat and trouble swallowing.   Eyes: Negative for visual disturbance.  Respiratory: Positive for shortness of breath. Negative for cough, chest tightness and wheezing.   Cardiovascular: Negative for chest pain.  Gastrointestinal: Positive for abdominal pain. Negative for nausea, vomiting, diarrhea and abdominal distention.       Some incisional pain no diffuse abdominal pain.  Endocrine: Negative for polydipsia, polyphagia and polyuria.  Genitourinary: Negative for dysuria, frequency and hematuria.  Musculoskeletal: Negative for gait problem.  Skin: Negative for color change, pallor and rash.  Neurological: Negative for dizziness, syncope, light-headedness and headaches.  Hematological: Does not bruise/bleed easily.  Psychiatric/Behavioral: Negative for behavioral problems and confusion.      Allergies  Review of patient's allergies indicates no known allergies.  Home Medications   Prior to Admission medications   Medication Sig Start Date End Date Taking? Authorizing Provider  acetaminophen (TYLENOL) 500 MG tablet Take 500 mg by mouth every 6 (six) hours  as needed for mild pain or moderate pain.   Yes Historical Provider, MD  Calcium Carbonate-Vitamin D (CALCIUM-VITAMIN D) 500-200 MG-UNIT per tablet Take 1 tablet by mouth daily.    Yes Historical Provider, MD  carvedilol (COREG) 25 MG tablet Take 1 tablet (25 mg total) by mouth 2 (two) times daily with a meal. 02/08/14  Yes Colon Branch, MD  cholecalciferol (VITAMIN D) 1000 UNITS tablet Take 1,000 Units by mouth daily.   Yes Historical Provider, MD  levothyroxine (SYNTHROID, LEVOTHROID) 125 MCG tablet Take 1 tablet (125 mcg total) by mouth daily. 10/13/14  Yes Philemon Kingdom, MD  mometasone (NASONEX) 50 MCG/ACT nasal spray Place 2 sprays into the nose daily as needed (rhinitis).  04/09/11  Yes Colon Branch, MD  multivitamin (ONE-A-DAY MEN'S) TABS Take 1 tablet by mouth daily.     Yes Historical Provider, MD  Omega-3 1000 MG CAPS Take 2 capsules by mouth daily.   Yes Historical Provider, MD  polyvinyl alcohol (LIQUIFILM TEARS) 1.4 % ophthalmic solution Place 1 drop into both eyes daily as needed (for allergies).   Yes Historical Provider, MD  simvastatin (ZOCOR) 20 MG tablet Take 1 tablet (20 mg total) by mouth at bedtime. 01/16/15  Yes Colon Branch, MD  tamsulosin (FLOMAX) 0.4 MG CAPS capsule TAKE ONE CAPSULE BY MOUTH AT BEDTIME 11/03/14  Yes Tyler Pita, MD  triamterene-hydrochlorothiazide (UEKCMKL-49) 37.5-25 MG per tablet TAKE ONE TABLET BY MOUTH ONCE DAILY 01/04/15  Yes Colon Branch, MD   BP 106/67 mmHg  Pulse 119  Temp(Src) 98.2 F (36.8 C) (Oral)  Resp 18  SpO2 92% Physical Exam  Constitutional: He is oriented to person, place, and time. He appears well-developed and well-nourished. No distress.  HENT:  Head: Normocephalic.  Eyes: Conjunctivae are normal. Pupils are equal, round, and reactive to light. No scleral icterus.  Neck: Normal range of motion. Neck supple. No thyromegaly present.  Cardiovascular: Normal rate and regular rhythm.  Exam reveals no gallop and no friction rub.   No murmur heard. Pulmonary/Chest: Effort normal and breath sounds normal. No respiratory distress. He has no wheezes. He has no rales.  Diminished bibasilar breath sounds left greater than right. Patient is wearing an abdominal binder. This was removed.  Abdominal: Soft. Bowel sounds are normal. He exhibits no distension. There is no tenderness. There is no rebound.    Musculoskeletal: Normal range of motion.  Neurological: He is alert and oriented to person, place, and time.  Skin: Skin is warm and dry. No rash noted.  No asymmetry the circumference of the lower legs.  No cording swelling or erythema.  Psychiatric: He has a normal mood and affect. His behavior is normal.    ED Course  Procedures (including critical care time) Labs Review Labs Reviewed  CBC - Abnormal; Notable for the following:    WBC 11.8 (*)    RBC 4.14 (*)    HCT 38.3 (*)    Platelets 522 (*)    All other components within normal limits  BASIC METABOLIC PANEL - Abnormal; Notable for the following:    CO2 17 (*)    Glucose, Bld 118 (*)    BUN 62 (*)    Creatinine, Ser 5.34 (*)    Calcium 8.2 (*)    GFR calc non Af Amer 10 (*)    GFR calc Af Amer 11 (*)    Anion gap 21 (*)    All other components within normal limits  BRAIN NATRIURETIC  PEPTIDE - Abnormal; Notable for the following:    B Natriuretic Peptide 116.9 (*)    All other components within normal limits  BLOOD GAS, ARTERIAL - Abnormal; Notable for the following:    pCO2 arterial 23.1 (*)    pO2, Arterial 64.3 (*)    Bicarbonate 14.2 (*)    Acid-base deficit 8.7 (*)    All other components within normal limits  I-STAT CG4 LACTIC ACID, ED - Abnormal; Notable for the following:    Lactic Acid, Venous 5.51 (*)    All other components within normal limits  CULTURE, BLOOD (ROUTINE X 2)  CULTURE, BLOOD (ROUTINE X 2)  I-STAT TROPOININ, ED    Imaging Review Dg Chest Port 1 View  02/28/2015   CLINICAL DATA:  Shortness of breath and hypertension  EXAM: PORTABLE CHEST - 1 VIEW  COMPARISON:  June 13, 2013  FINDINGS: There is airspace consolidation in the left base. Lungs elsewhere clear. Heart is upper normal in size with pulmonary vascularity within normal limits. No adenopathy. There is postoperative change in the lower neck region.  IMPRESSION: Left base pneumonia.   Electronically Signed   By: Lowella Grip III M.D.   On: 03/07/2015 15:52     EKG Interpretation   Date/Time:  Monday February 26 2015 15:26:39 EDT Ventricular Rate:  125 PR Interval:  138 QRS Duration: 117 QT Interval:  337 QTC Calculation: 486 R  Axis:   -66 Text Interpretation:  Sinus tachycardia Probable left atrial enlargement  Left anterior fascicular block Left ventricular hypertrophy Baseline  wander in lead(s) III aVL aVF Rate faster Confirmed by RANCOUR  MD,  STEPHEN ([id number]) on 03/02/2015 3:55:53 PM      MDM   Final diagnoses:  SOB (shortness of breath)  Hospital-acquired pneumonia  Acute kidney injury    Patient not hypoxemic. However remains tachypneic. Noted to be acidotic with anion gap of 21. Elevated lactate at 5.5. Acute kidney injury with creatinine of over 5 and 62. His abdomen is benign. He is taking by mouth. He has also. Does not appear peritoneal irritation. Passing gas and having bowel movements today. Cultures obtained given I, Max pain. Given IV fluid boluses. Discussed case with Dr. Maudie Mercury. Patient is going for VQ scan as he has acute kidney injury cannot have IV contrast to rule out pulmonary embolus. Step down bed is been requested. Patient remains awake alert oriented not distress.    Tanna Furry, MD 03/06/2015 [phone]

## 2015-02-27 ENCOUNTER — Inpatient Hospital Stay (HOSPITAL_COMMUNITY): Payer: PPO

## 2015-02-27 ENCOUNTER — Inpatient Hospital Stay (HOSPITAL_COMMUNITY): Payer: PPO | Admitting: Anesthesiology

## 2015-02-27 ENCOUNTER — Encounter (HOSPITAL_COMMUNITY): Admission: EM | Disposition: E | Payer: Self-pay | Source: Home / Self Care | Attending: Internal Medicine

## 2015-02-27 ENCOUNTER — Encounter (HOSPITAL_COMMUNITY): Payer: Self-pay | Admitting: General Surgery

## 2015-02-27 DIAGNOSIS — N179 Acute kidney failure, unspecified: Secondary | ICD-10-CM | POA: Insufficient documentation

## 2015-02-27 DIAGNOSIS — J9600 Acute respiratory failure, unspecified whether with hypoxia or hypercapnia: Secondary | ICD-10-CM | POA: Diagnosis present

## 2015-02-27 DIAGNOSIS — R0600 Dyspnea, unspecified: Secondary | ICD-10-CM

## 2015-02-27 DIAGNOSIS — J9601 Acute respiratory failure with hypoxia: Secondary | ICD-10-CM

## 2015-02-27 HISTORY — PX: LAPAROTOMY: SHX154

## 2015-02-27 LAB — CBC WITH DIFFERENTIAL/PLATELET
BAND NEUTROPHILS: 31 % — AB (ref 0–10)
BASOS ABS: 0 10*3/uL (ref 0.0–0.1)
BASOS PCT: 0 % (ref 0–1)
Blasts: 0 %
Eosinophils Absolute: 0 10*3/uL (ref 0.0–0.7)
Eosinophils Relative: 0 % (ref 0–5)
HCT: 36.8 % — ABNORMAL LOW (ref 39.0–52.0)
HEMOGLOBIN: 12.1 g/dL — AB (ref 13.0–17.0)
LYMPHS PCT: 6 % — AB (ref 12–46)
Lymphs Abs: 0.7 10*3/uL (ref 0.7–4.0)
MCH: 30.8 pg (ref 26.0–34.0)
MCHC: 32.9 g/dL (ref 30.0–36.0)
MCV: 93.6 fL (ref 78.0–100.0)
METAMYELOCYTES PCT: 2 %
MONO ABS: 1.5 10*3/uL — AB (ref 0.1–1.0)
MONOS PCT: 13 % — AB (ref 3–12)
Myelocytes: 2 %
Neutro Abs: 9.5 10*3/uL — ABNORMAL HIGH (ref 1.7–7.7)
Neutrophils Relative %: 46 % (ref 43–77)
Platelets: 472 10*3/uL — ABNORMAL HIGH (ref 150–400)
Promyelocytes Absolute: 0 %
RBC: 3.93 MIL/uL — ABNORMAL LOW (ref 4.22–5.81)
RDW: 13.4 % (ref 11.5–15.5)
WBC MORPHOLOGY: INCREASED
WBC: 11.7 10*3/uL — ABNORMAL HIGH (ref 4.0–10.5)
nRBC: 1 /100 WBC — ABNORMAL HIGH

## 2015-02-27 LAB — BASIC METABOLIC PANEL
ANION GAP: 21 — AB (ref 5–15)
BUN: 68 mg/dL — ABNORMAL HIGH (ref 6–23)
CHLORIDE: 105 mmol/L (ref 96–112)
CO2: 15 mmol/L — ABNORMAL LOW (ref 19–32)
Calcium: 7.1 mg/dL — ABNORMAL LOW (ref 8.4–10.5)
Creatinine, Ser: 5.5 mg/dL — ABNORMAL HIGH (ref 0.50–1.35)
GFR calc non Af Amer: 9 mL/min — ABNORMAL LOW (ref >90)
GFR, EST AFRICAN AMERICAN: 11 mL/min — AB (ref >90)
Glucose, Bld: 107 mg/dL — ABNORMAL HIGH (ref 70–99)
Potassium: 4 mmol/L (ref 3.5–5.1)
SODIUM: 141 mmol/L (ref 135–145)

## 2015-02-27 LAB — COMPREHENSIVE METABOLIC PANEL
ALBUMIN: 2.7 g/dL — AB (ref 3.5–5.2)
ALT: 36 U/L (ref 0–53)
ANION GAP: 16 — AB (ref 5–15)
AST: 47 U/L — ABNORMAL HIGH (ref 0–37)
Alkaline Phosphatase: 55 U/L (ref 39–117)
BUN: 69 mg/dL — ABNORMAL HIGH (ref 6–23)
CALCIUM: 7.2 mg/dL — AB (ref 8.4–10.5)
CO2: 15 mmol/L — ABNORMAL LOW (ref 19–32)
Chloride: 102 mmol/L (ref 96–112)
Creatinine, Ser: 5.29 mg/dL — ABNORMAL HIGH (ref 0.50–1.35)
GFR calc Af Amer: 11 mL/min — ABNORMAL LOW (ref >90)
GFR calc non Af Amer: 10 mL/min — ABNORMAL LOW (ref >90)
Glucose, Bld: 97 mg/dL (ref 70–99)
Potassium: 4.6 mmol/L (ref 3.5–5.1)
Sodium: 133 mmol/L — ABNORMAL LOW (ref 135–145)
TOTAL PROTEIN: 6.2 g/dL (ref 6.0–8.3)
Total Bilirubin: 1.5 mg/dL — ABNORMAL HIGH (ref 0.3–1.2)

## 2015-02-27 LAB — URINALYSIS, ROUTINE W REFLEX MICROSCOPIC
GLUCOSE, UA: NEGATIVE mg/dL
Ketones, ur: 15 mg/dL — AB
Nitrite: POSITIVE — AB
Protein, ur: 100 mg/dL — AB
SPECIFIC GRAVITY, URINE: 1.031 — AB (ref 1.005–1.030)
Urobilinogen, UA: 1 mg/dL (ref 0.0–1.0)
pH: 5 (ref 5.0–8.0)

## 2015-02-27 LAB — BLOOD GAS, ARTERIAL
ACID-BASE DEFICIT: 14.6 mmol/L — AB (ref 0.0–2.0)
Acid-base deficit: 16.4 mmol/L — ABNORMAL HIGH (ref 0.0–2.0)
Acid-base deficit: 9.7 mmol/L — ABNORMAL HIGH (ref 0.0–2.0)
BICARBONATE: 12.8 meq/L — AB (ref 20.0–24.0)
Bicarbonate: 13.7 mEq/L — ABNORMAL LOW (ref 20.0–24.0)
Bicarbonate: 15.1 mEq/L — ABNORMAL LOW (ref 20.0–24.0)
DRAWN BY: 276051
Drawn by: 276051
Drawn by: 276051
FIO2: 1 %
FIO2: 1 %
FIO2: 1 %
LHR: 35 {breaths}/min
MECHVT: 600 mL
MECHVT: 600 mL
MECHVT: 600 mL
O2 SAT: 97.1 %
O2 Saturation: 95 %
O2 Saturation: 96.2 %
PCO2 ART: 47.9 mmHg — AB (ref 35.0–45.0)
PCO2 ART: 30.8 mmHg — AB (ref 35.0–45.0)
PEEP/CPAP: 5 cmH2O
PEEP: 5 cmH2O
PEEP: 5 cmH2O
PO2 ART: 149 mmHg — AB (ref 80.0–100.0)
PO2 ART: 103 mmHg — AB (ref 80.0–100.0)
Patient temperature: 98.6
Patient temperature: 98.6
Patient temperature: 98.6
RATE: 24 resp/min
RATE: 24 resp/min
TCO2: 13.2 mmol/L (ref 0–100)
TCO2: 13.5 mmol/L (ref 0–100)
TCO2: 14.2 mmol/L (ref 0–100)
pCO2 arterial: 43.3 mmHg (ref 35.0–45.0)
pH, Arterial: 7.056 — CL (ref 7.350–7.450)
pH, Arterial: 7.128 — CL (ref 7.350–7.450)
pH, Arterial: 7.311 — ABNORMAL LOW (ref 7.350–7.450)
pO2, Arterial: 91.3 mmHg (ref 80.0–100.0)

## 2015-02-27 LAB — GLUCOSE, CAPILLARY
Glucose-Capillary: 15 mg/dL — CL (ref 70–99)
Glucose-Capillary: 48 mg/dL — ABNORMAL LOW (ref 70–99)

## 2015-02-27 LAB — PROCALCITONIN: Procalcitonin: 71.96 ng/mL

## 2015-02-27 LAB — INFLUENZA PANEL BY PCR (TYPE A & B)
H1N1FLUPCR: NOT DETECTED
INFLBPCR: NEGATIVE
Influenza A By PCR: NEGATIVE

## 2015-02-27 LAB — URINE MICROSCOPIC-ADD ON

## 2015-02-27 LAB — HEPATIC FUNCTION PANEL
ALBUMIN: 1.7 g/dL — AB (ref 3.5–5.2)
ALT: 26 U/L (ref 0–53)
AST: 170 U/L — ABNORMAL HIGH (ref 0–37)
Alkaline Phosphatase: 153 U/L — ABNORMAL HIGH (ref 39–117)
Bilirubin, Direct: 0.4 mg/dL (ref 0.0–0.5)
Indirect Bilirubin: 0.3 mg/dL (ref 0.3–0.9)
Total Bilirubin: 0.7 mg/dL (ref 0.3–1.2)
Total Protein: 4.4 g/dL — ABNORMAL LOW (ref 6.0–8.3)

## 2015-02-27 LAB — PROTIME-INR
INR: 2 — ABNORMAL HIGH (ref 0.00–1.49)
INR: 1.64 — AB (ref 0.00–1.49)
Prothrombin Time: 22.8 seconds — ABNORMAL HIGH (ref 11.6–15.2)
Prothrombin Time: 19.5 seconds — ABNORMAL HIGH (ref 11.6–15.2)

## 2015-02-27 LAB — ABO/RH: ABO/RH(D): AB POS

## 2015-02-27 LAB — TROPONIN I
TROPONIN I: 0.04 ng/mL — AB (ref <0.031)
Troponin I: 0.04 ng/mL — ABNORMAL HIGH (ref <0.031)

## 2015-02-27 LAB — CBC
HCT: 34.8 % — ABNORMAL LOW (ref 39.0–52.0)
Hemoglobin: 11 g/dL — ABNORMAL LOW (ref 13.0–17.0)
MCH: 31 pg (ref 26.0–34.0)
MCHC: 31.6 g/dL (ref 30.0–36.0)
MCV: 98 fL (ref 78.0–100.0)
Platelets: 424 10*3/uL — ABNORMAL HIGH (ref 150–400)
RBC: 3.55 MIL/uL — ABNORMAL LOW (ref 4.22–5.81)
RDW: 13.8 % (ref 11.5–15.5)
WBC: 17.4 10*3/uL — AB (ref 4.0–10.5)

## 2015-02-27 LAB — CALCIUM / CREATININE RATIO, URINE: CREATININE, URINE: 226.9 mg/dL

## 2015-02-27 LAB — LACTIC ACID, PLASMA
Lactic Acid, Venous: 8.5 mmol/L (ref 0.5–2.0)
Lactic Acid, Venous: 10.6 mmol/L (ref 0.5–2.0)

## 2015-02-27 LAB — CARBOXYHEMOGLOBIN
Carboxyhemoglobin: 0.8 % (ref 0.5–1.5)
Methemoglobin: 0.9 % (ref 0.0–1.5)
O2 SAT: 50.8 %
Total hemoglobin: 10.8 g/dL — ABNORMAL LOW (ref 13.5–18.0)

## 2015-02-27 LAB — MRSA PCR SCREENING: MRSA by PCR: NEGATIVE

## 2015-02-27 LAB — SODIUM, URINE, RANDOM: Sodium, Ur: 18 mEq/L

## 2015-02-27 LAB — FIBRINOGEN: Fibrinogen: >800 mg/dL — ABNORMAL HIGH (ref 204–475)

## 2015-02-27 LAB — MAGNESIUM: Magnesium: 2.7 mg/dL — ABNORMAL HIGH (ref 1.5–2.5)

## 2015-02-27 LAB — TSH: TSH: 4.199 u[IU]/mL (ref 0.350–4.500)

## 2015-02-27 LAB — GLUCOSE, RANDOM: GLUCOSE: 78 mg/dL (ref 70–99)

## 2015-02-27 LAB — LACTATE DEHYDROGENASE: LDH: 668 U/L — AB (ref 94–250)

## 2015-02-27 SURGERY — LAPAROTOMY, EXPLORATORY
Anesthesia: General | Site: Abdomen

## 2015-02-27 MED ORDER — SODIUM BICARBONATE 8.4 % IV SOLN
INTRAVENOUS | Status: AC
Start: 2015-02-27 — End: 2015-02-27
  Administered 2015-02-27: 100 meq
  Filled 2015-02-27: qty 50

## 2015-02-27 MED ORDER — ONDANSETRON HCL 4 MG/2ML IJ SOLN
INTRAMUSCULAR | Status: AC
  Filled 2015-02-27: qty 2

## 2015-02-27 MED ORDER — SODIUM CHLORIDE 0.9 % IV BOLUS (SEPSIS)
1000.0000 mL | Freq: Once | INTRAVENOUS | Status: AC
  Administered 2015-02-27: 1000 mL via INTRAVENOUS

## 2015-02-27 MED ORDER — IOHEXOL 300 MG/ML  SOLN: 25.0000 mL | INTRAMUSCULAR | Status: AC

## 2015-02-27 MED ORDER — SODIUM CHLORIDE 0.9 % IV SOLN
Freq: Once | INTRAVENOUS | Status: AC
  Administered 2015-02-27: 09:00:00 via INTRAVENOUS

## 2015-02-27 MED ORDER — AMIODARONE HCL IN DEXTROSE 360-4.14 MG/200ML-% IV SOLN
30.0000 mg/h | INTRAVENOUS | Status: DC
  Administered 2015-02-28: 30 mg/h via INTRAVENOUS
  Filled 2015-02-27: qty 200

## 2015-02-27 MED ORDER — DEXTROSE 50 % IV SOLN
INTRAVENOUS | Status: AC
  Administered 2015-02-27: 50 mL
  Filled 2015-02-27: qty 50

## 2015-02-27 MED ORDER — DEXTROSE 5 % IV SOLN
2.0000 ug/min | INTRAVENOUS | Status: DC
  Administered 2015-02-27: 45 ug/min via INTRAVENOUS
  Administered 2015-02-28: 5 ug/min via INTRAVENOUS
  Filled 2015-02-27 (×3): qty 16

## 2015-02-27 MED ORDER — DEXTROSE 10 % IV SOLN
INTRAVENOUS | Status: DC
  Administered 2015-02-27 – 2015-03-01 (×4): via INTRAVENOUS
  Filled 2015-02-27 (×6): qty 1000

## 2015-02-27 MED ORDER — SODIUM BICARBONATE 8.4 % IV SOLN
INTRAVENOUS | Status: AC
  Filled 2015-02-27: qty 50

## 2015-02-27 MED ORDER — SODIUM CHLORIDE 0.9 % IJ SOLN
INTRAMUSCULAR | Status: AC
  Filled 2015-02-27: qty 10

## 2015-02-27 MED ORDER — LEVOTHYROXINE SODIUM 100 MCG IV SOLR
62.5000 ug | Freq: Every day | INTRAVENOUS | Status: DC
Start: 2015-02-28 — End: 2015-03-01
  Administered 2015-02-28: 62.5 ug via INTRAVENOUS
  Filled 2015-02-27 (×2): qty 5

## 2015-02-27 MED ORDER — ROCURONIUM BROMIDE 100 MG/10ML IV SOLN
INTRAVENOUS | Status: AC
  Filled 2015-02-27: qty 1

## 2015-02-27 MED ORDER — SODIUM BICARBONATE 8.4 % IV SOLN
INTRAVENOUS | Status: AC
  Filled 2015-02-27: qty 100

## 2015-02-27 MED ORDER — VANCOMYCIN HCL IN DEXTROSE 1-5 GM/200ML-% IV SOLN
1000.0000 mg | INTRAVENOUS | Status: DC
  Administered 2015-02-28: 1000 mg via INTRAVENOUS
  Filled 2015-02-27: qty 200

## 2015-02-27 MED ORDER — VASOPRESSIN 20 UNIT/ML IV SOLN
0.0300 [IU]/min | INTRAVENOUS | Status: DC
  Administered 2015-02-27 – 2015-02-28 (×2): 0.03 [IU]/min via INTRAVENOUS
  Filled 2015-02-27 (×2): qty 2

## 2015-02-27 MED ORDER — SUCCINYLCHOLINE CHLORIDE 20 MG/ML IJ SOLN
INTRAMUSCULAR | Status: AC
  Filled 2015-02-27: qty 1

## 2015-02-27 MED ORDER — SODIUM BICARBONATE 8.4 % IV SOLN
INTRAVENOUS | Status: DC | PRN
  Administered 2015-02-27 (×2): 50 meq via INTRAVENOUS

## 2015-02-27 MED ORDER — SODIUM CHLORIDE 0.9 % IV SOLN
Freq: Once | INTRAVENOUS | Status: DC
Start: 2015-02-27 — End: 2015-03-01

## 2015-02-27 MED ORDER — DEXTROSE 5 % IV SOLN
500.0000 mg | Freq: Every day | INTRAVENOUS | Status: DC
  Administered 2015-02-27: 500 mg via INTRAVENOUS
  Filled 2015-02-27: qty 500

## 2015-02-27 MED ORDER — MIDAZOLAM HCL 2 MG/2ML IJ SOLN
1.0000 mg | INTRAMUSCULAR | Status: DC | PRN
  Filled 2015-02-27: qty 2

## 2015-02-27 MED ORDER — ALBUTEROL SULFATE (2.5 MG/3ML) 0.083% IN NEBU: 2.5000 mg | INHALATION_SOLUTION | RESPIRATORY_TRACT | Status: DC | PRN

## 2015-02-27 MED ORDER — EPHEDRINE SULFATE 50 MG/ML IJ SOLN
INTRAMUSCULAR | Status: AC
  Filled 2015-02-27: qty 1

## 2015-02-27 MED ORDER — FENTANYL CITRATE 0.05 MG/ML IJ SOLN
INTRAMUSCULAR | Status: AC
  Filled 2015-02-27: qty 5

## 2015-02-27 MED ORDER — NEOSTIGMINE METHYLSULFATE 10 MG/10ML IV SOLN
INTRAVENOUS | Status: AC
  Filled 2015-02-27: qty 1

## 2015-02-27 MED ORDER — SODIUM CHLORIDE 0.9 % IV SOLN
100.0000 mg | INTRAVENOUS | Status: AC
  Administered 2015-02-27: 100 mg via INTRAVENOUS
  Filled 2015-02-27: qty 100

## 2015-02-27 MED ORDER — VASOPRESSIN 20 UNIT/ML IV SOLN
0.0300 [IU]/min | INTRAVENOUS | Status: DC
  Administered 2015-02-27: 0.03 [IU]/min via INTRAVENOUS
  Filled 2015-02-27: qty 2

## 2015-02-27 MED ORDER — PROPOFOL 10 MG/ML IV BOLUS
INTRAVENOUS | Status: AC
  Filled 2015-02-27: qty 20

## 2015-02-27 MED ORDER — STERILE WATER FOR INJECTION IV SOLN
INTRAVENOUS | Status: DC
  Administered 2015-02-27 – 2015-02-28 (×4): via INTRAVENOUS
  Filled 2015-02-27 (×9): qty 850

## 2015-02-27 MED ORDER — GLYCOPYRROLATE 0.2 MG/ML IJ SOLN
INTRAMUSCULAR | Status: AC
  Filled 2015-02-27: qty 2

## 2015-02-27 MED ORDER — ZOLPIDEM TARTRATE 5 MG PO TABS
5.0000 mg | ORAL_TABLET | Freq: Once | ORAL | Status: AC
  Administered 2015-02-27: 5 mg via ORAL
  Filled 2015-02-27: qty 1

## 2015-02-27 MED ORDER — MIDAZOLAM HCL 2 MG/2ML IJ SOLN
1.0000 mg | INTRAMUSCULAR | Status: DC | PRN
  Administered 2015-02-27 (×2): 1 mg via INTRAVENOUS
  Filled 2015-02-27: qty 2

## 2015-02-27 MED ORDER — CALCIUM CHLORIDE 10 % IV SOLN
INTRAVENOUS | Status: AC
  Filled 2015-02-27: qty 10

## 2015-02-27 MED ORDER — ROCURONIUM BROMIDE 100 MG/10ML IV SOLN
INTRAVENOUS | Status: DC | PRN
  Administered 2015-02-27: 100 mg via INTRAVENOUS
  Administered 2015-02-27: 50 mg via INTRAVENOUS

## 2015-02-27 MED ORDER — HYDROCORTISONE NA SUCCINATE PF 100 MG IJ SOLR
50.0000 mg | Freq: Four times a day (QID) | INTRAMUSCULAR | Status: DC
  Administered 2015-02-27: 50 mg via INTRAVENOUS
  Filled 2015-02-27: qty 2

## 2015-02-27 MED ORDER — SODIUM CHLORIDE 0.9 % IV SOLN: 100.0000 mg | Freq: Every day | INTRAVENOUS | Status: DC

## 2015-02-27 MED ORDER — AMIODARONE HCL IN DEXTROSE 360-4.14 MG/200ML-% IV SOLN
60.0000 mg/h | INTRAVENOUS | Status: AC
  Administered 2015-02-27 (×2): 60 mg/h via INTRAVENOUS
  Filled 2015-02-27: qty 200

## 2015-02-27 MED ORDER — FENTANYL CITRATE 0.05 MG/ML IJ SOLN
50.0000 ug | INTRAMUSCULAR | Status: DC | PRN
  Administered 2015-02-27 (×2): 50 ug via INTRAVENOUS
  Filled 2015-02-27: qty 2

## 2015-02-27 MED ORDER — ALBUMIN HUMAN 5 % IV SOLN
INTRAVENOUS | Status: AC
  Filled 2015-02-27: qty 250

## 2015-02-27 MED ORDER — MIDAZOLAM HCL 2 MG/2ML IJ SOLN
INTRAMUSCULAR | Status: AC
  Filled 2015-02-27: qty 4

## 2015-02-27 MED ORDER — ROCURONIUM BROMIDE 50 MG/5ML IV SOLN
INTRAVENOUS | Status: AC
  Filled 2015-02-27: qty 2

## 2015-02-27 MED ORDER — SODIUM CHLORIDE 0.9 % IV SOLN
250.0000 mg | Freq: Two times a day (BID) | INTRAVENOUS | Status: DC
  Administered 2015-02-27 – 2015-03-01 (×4): 250 mg via INTRAVENOUS
  Filled 2015-02-27 (×5): qty 250

## 2015-02-27 MED ORDER — FENTANYL CITRATE 0.05 MG/ML IJ SOLN
50.0000 ug | INTRAMUSCULAR | Status: DC | PRN
  Administered 2015-02-28: 50 ug via INTRAVENOUS
  Filled 2015-02-27 (×2): qty 2

## 2015-02-27 MED ORDER — 0.9 % SODIUM CHLORIDE (POUR BTL) OPTIME
TOPICAL | Status: DC | PRN
  Administered 2015-02-27 (×2): 1000 mL

## 2015-02-27 MED ORDER — DEXTROSE 50 % IV SOLN
INTRAVENOUS | Status: AC
Start: 2015-02-27 — End: 2015-02-27
  Administered 2015-02-27: 50 mL
  Filled 2015-02-27: qty 50

## 2015-02-27 MED ORDER — LORAZEPAM 2 MG/ML IJ SOLN
2.0000 mg | INTRAMUSCULAR | Status: DC | PRN
  Administered 2015-02-27 – 2015-03-01 (×2): 2 mg via INTRAVENOUS
  Filled 2015-02-27 (×2): qty 1

## 2015-02-27 MED ORDER — FENTANYL CITRATE 0.05 MG/ML IJ SOLN
INTRAMUSCULAR | Status: AC
  Filled 2015-02-27: qty 4

## 2015-02-27 MED ORDER — DEXTROSE 5 % IV SOLN
2.0000 ug/min | INTRAVENOUS | Status: DC
  Administered 2015-02-27 (×3): 50 ug/min via INTRAVENOUS
  Filled 2015-02-27 (×4): qty 4

## 2015-02-27 MED ORDER — HYDROMORPHONE HCL 1 MG/ML IJ SOLN: 0.2500 mg | INTRAMUSCULAR | Status: DC | PRN

## 2015-02-27 MED ORDER — MORPHINE SULFATE 2 MG/ML IJ SOLN
2.0000 mg | Freq: Once | INTRAMUSCULAR | Status: AC
  Administered 2015-02-27: 2 mg via INTRAVENOUS
  Filled 2015-02-27: qty 1

## 2015-02-27 MED ORDER — DEXTROSE 50 % IV SOLN
50.0000 mL | Freq: Once | INTRAVENOUS | Status: AC
  Administered 2015-02-27: 50 mL via INTRAVENOUS

## 2015-02-27 MED ORDER — SODIUM BICARBONATE 8.4 % IV SOLN
100.0000 meq | Freq: Once | INTRAVENOUS | Status: AC
  Administered 2015-02-27: 100 meq via INTRAVENOUS

## 2015-02-27 MED ORDER — LIDOCAINE HCL (CARDIAC) 20 MG/ML IV SOLN
INTRAVENOUS | Status: AC
  Filled 2015-02-27: qty 5

## 2015-02-27 MED ORDER — ETOMIDATE 2 MG/ML IV SOLN
INTRAVENOUS | Status: AC
  Filled 2015-02-27: qty 20

## 2015-02-27 MED ORDER — SODIUM CHLORIDE 0.9 % IV SOLN
INTRAVENOUS | Status: DC
  Administered 2015-02-27 – 2015-02-28 (×2): via INTRAVENOUS

## 2015-02-27 MED ORDER — AMIODARONE LOAD VIA INFUSION: 150.0000 mg | Freq: Once | INTRAVENOUS | Status: DC

## 2015-02-27 MED ORDER — LORAZEPAM 2 MG/ML IJ SOLN
INTRAMUSCULAR | Status: AC
  Filled 2015-02-27: qty 1

## 2015-02-27 MED ORDER — PANTOPRAZOLE SODIUM 40 MG IV SOLR
40.0000 mg | Freq: Two times a day (BID) | INTRAVENOUS | Status: DC
  Administered 2015-02-28 (×3): 40 mg via INTRAVENOUS
  Filled 2015-02-27 (×4): qty 40

## 2015-02-27 MED ORDER — AMIODARONE IV BOLUS ONLY 150 MG/100ML
150.0000 mg | Freq: Once | INTRAVENOUS | Status: AC
  Administered 2015-02-27: 150 mg via INTRAVENOUS

## 2015-02-27 MED ORDER — DEXTROSE 5 % IV SOLN
1.0000 g | Freq: Every day | INTRAVENOUS | Status: DC
  Administered 2015-02-27: 1 g via INTRAVENOUS
  Filled 2015-02-27: qty 10

## 2015-02-27 SURGICAL SUPPLY — 43 items
APPLICATOR COTTON TIP 6IN STRL (MISCELLANEOUS) ×6 IMPLANT
BLADE EXTENDED COATED 6.5IN (ELECTRODE) ×2 IMPLANT
BLADE HEX COATED 2.75 (ELECTRODE) ×3 IMPLANT
COVER MAYO STAND STRL (DRAPES) ×3 IMPLANT
DRAIN CHANNEL 19F RND (DRAIN) IMPLANT
DRAIN CHANNEL RND F F (WOUND CARE) ×4 IMPLANT
DRAPE LAPAROSCOPIC ABDOMINAL (DRAPES) ×3 IMPLANT
DRAPE UTILITY XL STRL (DRAPES) ×3 IMPLANT
DRAPE WARM FLUID 44X44 (DRAPE) ×3 IMPLANT
ELECT REM PT RETURN 9FT ADLT (ELECTROSURGICAL) ×3
ELECTRODE REM PT RTRN 9FT ADLT (ELECTROSURGICAL) ×1 IMPLANT
EVACUATOR DRAINAGE 10X20 100CC (DRAIN) IMPLANT
EVACUATOR SILICONE 100CC (DRAIN) ×4 IMPLANT
GAUZE SPONGE 4X4 12PLY STRL (GAUZE/BANDAGES/DRESSINGS) ×3 IMPLANT
GLOVE ECLIPSE 8.0 STRL XLNG CF (GLOVE) ×3 IMPLANT
GLOVE INDICATOR 8.0 STRL GRN (GLOVE) ×6 IMPLANT
GOWN STRL REUS W/TWL XL LVL3 (GOWN DISPOSABLE) ×6 IMPLANT
KIT BASIN OR (CUSTOM PROCEDURE TRAY) ×3 IMPLANT
NS IRRIG 1000ML POUR BTL (IV SOLUTION) ×3 IMPLANT
PACK GENERAL/GYN (CUSTOM PROCEDURE TRAY) ×3 IMPLANT
PAD ABD 8X10 STRL (GAUZE/BANDAGES/DRESSINGS) ×2 IMPLANT
SPONGE DRAIN TRACH 4X4 STRL 2S (GAUZE/BANDAGES/DRESSINGS) ×4 IMPLANT
SPONGE LAP 18X18 X RAY DECT (DISPOSABLE) ×6 IMPLANT
STAPLER VISISTAT 35W (STAPLE) ×3 IMPLANT
SUCTION POOLE TIP (SUCTIONS) ×3 IMPLANT
SUT ETHILON 3 0 PS 1 (SUTURE) ×4 IMPLANT
SUT PDS AB 1 CTX 36 (SUTURE) ×4 IMPLANT
SUT PDS AB 1 TP1 96 (SUTURE) ×4 IMPLANT
SUT SILK 2 0 (SUTURE) ×3
SUT SILK 2 0 SH (SUTURE) ×2 IMPLANT
SUT SILK 2 0 SH CR/8 (SUTURE) ×3 IMPLANT
SUT SILK 2 0SH CR/8 30 (SUTURE) ×2 IMPLANT
SUT SILK 2-0 18XBRD TIE 12 (SUTURE) ×1 IMPLANT
SUT SILK 3 0 (SUTURE) ×3
SUT SILK 3 0 SH CR/8 (SUTURE) ×3 IMPLANT
SUT SILK 3-0 18XBRD TIE 12 (SUTURE) ×1 IMPLANT
SUT VIC AB 3-0 SH 18 (SUTURE) ×2 IMPLANT
SUT VICRYL 2 0 18  UND BR (SUTURE) ×2
SUT VICRYL 2 0 18 UND BR (SUTURE) IMPLANT
TAPE CLOTH SURG 6X10 WHT LF (GAUZE/BANDAGES/DRESSINGS) ×2 IMPLANT
TOWEL OR 17X26 10 PK STRL BLUE (TOWEL DISPOSABLE) ×6 IMPLANT
TOWEL OR NON WOVEN STRL DISP B (DISPOSABLE) ×3 IMPLANT
TRAY FOLEY CATH 14FRSI W/METER (CATHETERS) ×3 IMPLANT

## 2015-02-27 NOTE — Procedures (Signed)
Arterial Catheter Insertion Procedure Note [name] Wood [id number] 10/04/1941  Procedure: Insertion of Arterial Catheter  Indications: Blood pressure monitoring and Frequent blood sampling  Procedure Details Consent: Unable to obtain consent because of emergent medical necessity. Time Out: Verified patient identification, verified procedure, site/side was marked, verified correct patient position, special equipment/implants available, medications/allergies/relevent history reviewed, required imaging and test results available.  Performed  Maximum sterile technique was used including antiseptics, cap, gloves, gown, hand hygiene and mask. Skin prep: Chlorhexidine; local anesthetic administered 20 gauge catheter was inserted into right femoral artery using the Seldinger technique.  Evaluation Blood flow good; BP tracing good. Complications: No apparent complications.   Baltazar Apo, MD, PhD 03/04/2015, 12:33 PM [facility] [phone] or if no answer [phone]

## 2015-02-27 NOTE — Progress Notes (Signed)
[place] for Primaxin Indication: septic shock  No Known Allergies  Patient Measurements: Height: 5\' 11"  (180.3 cm) Weight: 192 lb 10.9 oz (87.4 kg) IBW/kg (Calculated) : 75.3 Last documented weight 85.8 kg (10/18/14)  Vital Signs: Temp: 96.6 F (35.9 C) (04/05 1245) Temp Source: Core (Comment) (04/05 1000) BP: 61/31 mmHg (04/05 1228) Pulse Rate: 54 (04/05 1245) Intake/Output from previous day: 04/04 0701 - 04/05 0700 In: 1370 [P.O.:120; I.V.:1250] Out: 250 [Urine:250]  Labs:  Recent Labs  03/24/2015 1539 03/18/2015 0112 03/03/2015 0345  WBC 11.8*  --  11.7*  HGB 13.1  --  12.1*  PLT 522*  --  472*  LABCREA  --  226.9  --   CREATININE 5.34*  --  5.29*   Estimated Creatinine Clearance: 13 mL/min (by C-G formula based on Cr of 5.29).  No results for input(s): VANCOTROUGH, VANCOPEAK, VANCORANDOM, GENTTROUGH, GENTPEAK, GENTRANDOM, TOBRATROUGH, TOBRAPEAK, TOBRARND, AMIKACINPEAK, AMIKACINTROU, AMIKACIN in the last 72 hours.   Microbiology: Recent Results (from the past 720 hour(s))  Culture, blood (routine x 2)     Status: None (Preliminary result)   Collection Time: 03/22/2015  5:49 PM  Result Value Ref Range Status   Specimen Description BLOOD RIGHT ANTECUBITAL  Final   Special Requests BOTTLES DRAWN AEROBIC AND ANAEROBIC 5 CC EACH  Final   Culture   Final           BLOOD CULTURE RECEIVED NO GROWTH TO DATE CULTURE WILL BE HELD FOR 5 DAYS BEFORE ISSUING A FINAL NEGATIVE REPORT Performed at [facility]    Report Status PENDING  Incomplete  Culture, blood (routine x 2)     Status: None (Preliminary result)   Collection Time: 03/24/2015  5:49 PM  Result Value Ref Range Status   Specimen Description BLOOD LEFT ANTECUBITAL  Final   Special Requests BOTTLES DRAWN AEROBIC AND ANAEROBIC 5 CC EACH  Final   Culture   Final           BLOOD CULTURE RECEIVED NO GROWTH TO DATE CULTURE WILL BE HELD FOR 5 DAYS BEFORE ISSUING A FINAL NEGATIVE  REPORT Performed at [facility]    Report Status PENDING  Incomplete  MRSA PCR Screening     Status: None   Collection Time: 03/18/2015 11:37 PM  Result Value Ref Range Status   MRSA by PCR NEGATIVE NEGATIVE Final    Comment:        The GeneXpert MRSA Assay (FDA approved for NASAL specimens only), is one component of a comprehensive MRSA colonization surveillance program. It is not intended to diagnose MRSA infection nor to guide or monitor treatment for MRSA infections.     Medical History: Past Medical History  Diagnosis Date  . Allergy     rhinitis  . Hyperlipidemia   . Hypertension   . Osteoarthritis   . Erectile dysfunction   . Overweight(278.02)   . Renal insufficiency     baseline cr 1.5 11-06; cr 1.3 on 5-02  . Prostate cancer 2-11    prostate  . Prostate ca 2013    seeding implant  . 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 Gastrointestinal stromal tumor (GIST)     Assessment: 43 yoM admitted 4/4 with suspected pneumonia.CXR shows left lower pneumonia.He also has acute renal failure on admission.Pharmacy consulted to dose Vancomycin.  Patient coded during intubation procedure.  Now in septic shock.  Primaxin added.   4/4 >> Cefepime x1 4/4 >>  Vanc >> 4/4 >> Levaquin >> 4/5 4/5 >> Azith >> 4/5 >> CTX x 1 4/5 >> Primaxin >>  Drug level / dose changes info: 4/4 1800 Vanc 1g x 1  Day #2 of antibiotics. AKI. CrCl~12 ml/min normalized.  Goal of Therapy:  Vancomycin trough level 15-20 mcg/ml  Doses adjusted per renal function Eradication of infection  Plan:  1.  Primaxin 250 mg IV q12h. 2.  Vancomycin 1g IV q48h.  Hershal Coria, PharmD, BCPS Pager: [phone] 03/16/2015 1:25 PM

## 2015-02-27 NOTE — Progress Notes (Signed)
Echocardiogram 2D Echocardiogram has been performed.  [name] Wood 03/04/2015, 1:38 PM

## 2015-02-27 NOTE — Progress Notes (Signed)
Pt to the OR, family in waiting room made aware.

## 2015-02-27 NOTE — Anesthesia Postprocedure Evaluation (Signed)
  Anesthesia Post-op Note  Patient: [name] HICKLE Sr.  Procedure(s) Performed: Procedure(s) (LRB): EXPLORATORY LAPAROTOMY, REPAIR OF GASTRIC STAPLE LINE LEAK (N/A)  Patient Location:ICU  Anesthesia Type: general  Level of Consciousness: unconscious  Airway and Oxygen Therapy: Intubated on ventilator  Post-op Pain: no evidence observed  Post-op Assessment: Critically ill  Last Vitals:  Filed Vitals:   03/20/2015 2215  BP: 122/62  Pulse: 113  Temp:   Resp: 35    Post-op Vital Signs: unstable supported with vasopressors   Complications: No apparent anesthesia complications

## 2015-02-27 NOTE — Procedures (Signed)
  PCCM Procedure Note  / CPR Note  Pt evaluated this am with evidence for evolving septic shock and associated acute respiratory failure. He has a suspected LLL PNA, also very concerning for an intra-abd infection or post-op complication. While discussing status and obtaining procedural consent from pt's wife, the patient became more dusky, agonal, clearly decompensating.   Emergent intubation was initiated. Pt became progressively bradycardic and lost pulses as he was being intubated. He had emesis of enteral CT contrast before and during the intubation. ACLS initiated immediately. Please refer also to code sheets for meds and times delivered. He was treated empirically for hyperkalemia, received multiple runs of epi, vasopressin x 1, bicarb, norepi gtt. He achieved an agonal non-perfusing rhythm and was paced w 136mA. A pressure was reachieved, Notes confirm 21 minutes to [facility]. Norepi was able to be weaned to off. Pacing was stopped and he had a narrow complex atrial fib underneath, rate 140's.   Details of events discussed with family after [facility]. We were able to bring his wife back into the room during CPR at her request.   Will plan to obtain CT scan abd if he is stable to go. May need to involve [facility] depending on the results. Will follow closely.    Baltazar Apo, MD, PhD 02/24/2015, 12:49 PM [facility] [phone] or if no answer [phone]'

## 2015-02-27 NOTE — Consult Note (Signed)
PULMONARY / CRITICAL CARE MEDICINE   Name: [name] BEIGEL Sr. MRN: [id number] DOB: 1941-08-15    ADMISSION DATE:  02/27/2015 CONSULTATION DATE:  03/04/15  REFERRING MD :  Dr. 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  Work up consistent with LLL PNA.  On 2023-03-04 the patient was noted to have change in mental status and rising lactate. PCCM consulted. patient subsequently decompensated with cardiopulmonary arrest requiring ~20 min ACLS with ROSC.    STUDIES:  03-04-2023  CT ABD/Pelvis >> 2023-03-04  ECHO >>   SIGNIFICANT EVENTS: 2/29  Seen at [facility] by Dr. 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  Presented to [facility] for dyspnea / chills.  Admitted per [facility] 2023-03-04  PCCM consulted for rising lactic acid, hypotension    HISTORY OF PRESENT ILLNESS:  74 y/o M with PMH of seasonal allergies, hyperlipidemia, hypertension, osteoarthritis, morbid obesity, renal insufficiency (baseline creatinine approximately 1.5), prostate cancer S/P seed implant in 2013, multinodular thyroid goiter, and necrotic gastric mass S/P resection at [facility] on 3/29 ( DDX of GIST tumor versus lymphoma versus adenocarcinoma versus malignant epithelial cancer - no confirmed diagnosis found in care everywhere chart review) who was admitted to [facility] on 4/4 with dyspnea and chills.  The patient's son had noted progressive shortness of breath since discharge from [facility] and had called the surgeons office with reports of increasing shortness of breath and was directed to the ER. Upon ER evaluation he was noted to have acute kidney injury with a serum creatinine of 5.34, an anion gap of 21, mild leukocytosis with WBC of 11.8,  and a lactic acid of 5.51. He was  admitted per [facility] hospitalist for left lower lobe pneumonia. On the a.m. of 4/5 staff noted hypotension.  blood pressure responded to normal saline bolus.  Repeat lactate was assessed and rising at 8.5. PCCM consulted for evaluation of sepsis.  Upon discussing procedural consent with wife for central line he had an acute decompensation with cardiopulmonary arrest lasting approximately 20 minutes requiring ACLS with ROSC.      PAST MEDICAL HISTORY :   has a past medical history of Allergy; Hyperlipidemia; Hypertension; Osteoarthritis; Erectile dysfunction; Overweight(278.02); Renal insufficiency; Prostate cancer (2-11); Prostate ca (2013); Thyroid goiter (05-31-13); and Gastrointestinal stromal tumor (GIST).  has past surgical history that includes Tonsillectomy (1967); Radioactive seed implant (~2012); Hernia repair (2009); Thyroidectomy (N/A, 06/03/2013); and Stomach surgery (2016).   HOME MEDICATIONS:  Prior to Admission medications   Medication Sig Start Date End Date Taking? Authorizing Provider  acetaminophen (TYLENOL) 500 MG tablet Take 500 mg by mouth every 6 (six) hours as needed for mild pain or moderate pain.   Yes Historical Provider, MD  Calcium Carbonate-Vitamin D (CALCIUM-VITAMIN D) 500-200 MG-UNIT per tablet Take 1 tablet by mouth daily.    Yes Historical Provider, MD  carvedilol (COREG) 25 MG tablet Take 1 tablet (25 mg total) by mouth 2 (two) times daily with a meal. 02/08/14  Yes Colon Branch, MD  cholecalciferol (VITAMIN D) 1000 UNITS tablet Take 1,000 Units by mouth daily.   Yes Historical Provider, MD  levothyroxine (SYNTHROID, LEVOTHROID) 125 MCG tablet Take 1 tablet (125 mcg total) by mouth daily. 10/13/14  Yes Philemon Kingdom, MD  mometasone (NASONEX) 50 MCG/ACT nasal spray Place 2 sprays into the nose daily as needed (rhinitis).  04/09/11  Yes Colon Branch, MD  multivitamin (ONE-A-DAY MEN'S) TABS Take 1 tablet by mouth daily.     Yes Historical Provider, MD  Omega-3 1000 MG CAPS  Take 2 capsules by mouth daily.   Yes Historical Provider, MD  polyvinyl alcohol (LIQUIFILM TEARS) 1.4 % ophthalmic solution Place 1 drop into both eyes daily as needed (for allergies).   Yes Historical Provider, MD  simvastatin (ZOCOR) 20 MG tablet Take 1 tablet (20 mg total) by mouth at bedtime. 01/16/15  Yes Colon Branch, MD  tamsulosin (FLOMAX) 0.4 MG CAPS capsule TAKE ONE CAPSULE BY MOUTH AT BEDTIME 11/03/14  Yes Tyler Pita, MD  triamterene-hydrochlorothiazide (MEQASTM-19) 37.5-25 MG per tablet TAKE ONE TABLET BY MOUTH ONCE DAILY 01/04/15  Yes Colon Branch, MD   No Known Allergies  FAMILY HISTORY:  indicated that his mother is deceased. He indicated that his father is deceased. He indicated that all of his three daughters are alive. He indicated that his son is alive.    SOCIAL HISTORY:  reports that he has never smoked. He has never used smokeless tobacco. He reports that he does not drink alcohol or use illicit drugs.  REVIEW OF SYSTEMS:  Unable to complete as patient is altered on MV  SUBJECTIVE:   VITAL SIGNS: Temp:  [97.5 F (36.4 C)-98.7 F (37.1 C)] 98.1 F (36.7 C) (04/05 1000) Pulse Rate:  [30-127] 117 (04/05 0400) Resp:  [18-32] 24 (04/05 1000) BP: (73-138)/(31-87) 94/31 mmHg (04/05 1000) SpO2:  [88 %-100 %] 98 % (04/05 0800) Weight:  [192 lb 10.9 oz (87.4 kg)] 192 lb 10.9 oz (87.4 kg) (04/05 0400) HEMODYNAMICS:   VENTILATOR SETTINGS: Vent Mode:  [-] PRVC Set Rate:  [18 bmp] 18 bmp Vt Set:  [600 mL] 600 mL   INTAKE / OUTPUT:  Intake/Output Summary (Last 24 hours) at 03/11/2015 1234 Last data filed at 02/23/2015 0953  Gross per 24 hour  Intake   3670 ml  Output    250 ml  Net   3420 ml    PHYSICAL EXAMINATION: General:  Acutely ill adult male in NAD Neuro:  Obtunded post arrest HEENT:  OETT, mm pale, moist, no jvd  Cardiovascular:  s1s2 irr irr  Lungs:  resp's even/non-labored on MV, lungs coarse bilaterally anterior, diminished lower lateral  Abdomen:   Mild distention, midline abd staples intact, bsx4 hypoactive, coffee ground emesis  Musculoskeletal:  No acute deformities  Skin:  Cool extremites/dry, no edema   LABS:  CBC  Recent Labs Lab 03/03/2015 1539 03/08/2015 0345  WBC 11.8* 11.7*  HGB 13.1 12.1*  HCT 38.3* 36.8*  PLT 522* 472*   Coag's No results for input(s): APTT, INR in the last 168 hours.   BMET  Recent Labs Lab 03/10/2015 1539 03/05/2015 0345  NA 136 133*  K 4.5 4.6  CL 98 102  CO2 17* 15*  BUN 62* 69*  CREATININE 5.34* 5.29*  GLUCOSE 118* 97   Electrolytes  Recent Labs Lab 02/28/2015 1539 03/16/2015 2200 03/08/2015 0345  CALCIUM 8.2*  --  7.2*  MG  --  2.2  --   PHOS  --  5.2*  --    Sepsis Markers  Recent Labs Lab 03/21/2015 1754 02/23/2015 0945  LATICACIDVEN 5.51* 8.5*  PROCALCITON  --  71.96   ABG  Recent Labs Lab 02/28/2015 1756  PHART 7.405  PCO2ART 23.1*  PO2ART 64.3*   Liver Enzymes  Recent Labs Lab 03/11/2015 0345  AST 47*  ALT 36  ALKPHOS 55  BILITOT 1.5*  ALBUMIN 2.7*   Cardiac Enzymes  Recent Labs Lab 03/16/2015 2200 03/23/2015 0345 03/02/2015 0945  TROPONINI 0.04* 0.04* 0.04*   Glucose No results for input(s): GLUCAP in the last 168 hours.  Imaging Nm Pulmonary Perf And Vent  02/25/2015   CLINICAL DATA:  Shortness of breath.  Recent abdominal surgery.  EXAM: NUCLEAR MEDICINE VENTILATION - PERFUSION LUNG SCAN  TECHNIQUE: Ventilation images were obtained in multiple projections using inhaled aerosol technetium 99 M DTPA. Perfusion images were obtained in multiple projections after intravenous injection of Tc-82m MAA.  RADIOPHARMACEUTICALS:  40.0 mCi Tc-41m DTPA aerosol and 5.1 mCi Tc-82m MAA  COMPARISON:  Chest x-ray 03/10/2015  FINDINGS: Ventilation: Diffuse patchy heterogeneous ventilation study with clumping of the radiopharmaceutical.  Perfusion: No definite segmental or subsegmental perfusion defects to suggest pulmonary embolism.  IMPRESSION: Abnormal ventilation scan but no  definite perfusion defects to suggest pulmonary embolism.   Electronically Signed   By: Marijo Sanes M.D.   On: 03/04/2015 20:35   Dg Chest Port 1 View  03/22/2015   CLINICAL DATA:  Shortness of breath and hypertension  EXAM: PORTABLE CHEST - 1 VIEW  COMPARISON:  June 13, 2013  FINDINGS: There is airspace consolidation in the left base. Lungs elsewhere clear. Heart is upper normal in size with pulmonary vascularity within normal limits. No adenopathy. There is postoperative change in the lower neck region.  IMPRESSION: Left base pneumonia.   Electronically Signed   By: Lowella Grip III M.D.   On: 02/25/2015 15:52     ASSESSMENT / PLAN:  PULMONARY OETT 4/5 >>  A: Acute Respiratory Failure  LLL PNA  Presumed Aspiration RUL - during cardiac arrest with vomiting P:   MV support, 8cc/kg Follow up ABG Now CXR for line / ETT confirmation and in AM See ID for abx Wean O2 for sats > 92% PRN albuterol   CARDIOVASCULAR CVL R IJ TLC 4/5 >>  A:  Cardiopulmonary Arrest  Shock - suspect septic with rising lactate/PCT Atrial Fibrillation  Hx HTN, HLD P:  Levophed gtt to maintain MAP > 65 Vasopressin gtt Now EKG  Amiodarone bolus + gtt Hold stress steroids for now NS bolus now Hold home Coreg  Assess ECHO   RENAL A:   Anion Gap / Lactic Acidosis  Acute Kidney Injury  Hyponatremia  P:   Repeat labs now NaHCO3 gtt @ 164ml/hr Trend electrolytes and replace as indicated, monitor K with bicarb gtt Follow lactate   GASTROINTESTINAL A:   GIST Tumor s/p Resection - resection done at [facility] Obesity  Vent Associated Dysphagia  P:   NPO Place OGT to LIS  PPI  CT ABD/Pelvis once stabilized for transport  CCS consulted for evaluation, appreciate input  HEMATOLOGIC A:   Mild Anemia - suspect chronic disease related  P:  SCD's for DVT prevention for now  INFECTIOUS A:   PNA - LLL noted on admit, RUL airspace disease suspected aspiration with cardiac arrest  Septic  Shock - concern for intra-abdominal source  UTI  P:   BCx2 4/4 >>  UA 4/5 >> positive UTI UC 4/5 >>  Sputum 4/5 >> Flu PCR 4/5 >>    Vanco, start date 4/4, day 2/x Imipenem, start date 4/5, day 1/x  Azithro, start date 4/4, day 2/x  Trend PCT, Lactate Follow fever curve /  WBC  ENDOCRINE A:   At Risk for Hypoglycemia  Hypothyroidism  P:   CBG Q4 while NPO Assess TSH  Synthroid IV (home dose 125 mcg)  NEUROLOGIC A:   Post Arrest Encephalopathy - 4/5 P:   RASS goal: 0 to -1 PRN fentanyl / versed for pain / sedation  Serial neuro exams, may require CT head    FAMILY  - Updates: Family (wife, daughters etc) updated extensively at bedside.  Explained critical nature of illness and that he has suffered a prolonged (20 min) arrest and that if he were to arrest a second time that prolonged ACLS would not be in his best interest.  Full code for now.      [name], NP-C [facility] Pgr: [phone] or [phone] 03/11/2015, 12:34 PM   Attending Note:  I have examined patient, reviewed labs, studies and notes. I have discussed the case with B Ollis, and I agree with the data and plans as amended above. Pt brought to ICU with septic shock, suspected etiology LLL PNA. On evaluation was mottled, tachypneic, cool. Decision was made to intubate and escalate therapy for sepsis. Before he could be intubated he rapidly declined and required ACLS.  He was stabilized after 20 minutes CPR. CT abdomen will be performed to r/o a perforation given his rising lactate. I spoke with his wife and other family, have recommended that we support him aggressively, but that he would likely not survive repeat prolonged CPR. They understand that if he decompensates such that he can not be quickly defibrillated or resuscitated then we will stop CPR efforts.  Independent critical care time is 90 minutes.   Baltazar Apo, MD, PhD 02/28/2015, 6:23 PM [facility] [phone] or if no answer [phone]

## 2015-02-27 NOTE — Op Note (Signed)
Operative Note  [name] Wood. male 74 y.o. 02/24/2015  PREOPERATIVE DX:  Gastric leak  POSTOPERATIVE DX:  Gastric staple line leak, multiple intra-abdominal abscesses  PROCEDURE:   Exploratory laparotomy, drainage of multiple intra-abdominal abscesses, repair of gastric staple line leak.         Surgeon: Odis Hollingshead   Assistants: Excell Seltzer M.D.  Anesthesia: General endotracheal anesthesia  Indications:   This is a 74 year old male who underwent resection of a gastric tumor on the lesser curvature of the stomach 6 days ago in [facility].  He was admitted here yesterday complaining of increasing shortness of breath with chills. He was found to have a left-sided pneumonia as well as acute renal failure. Today, he decompensated and had a cardiac arrest with 20 minutes of CPR. He was resuscitated. He was profoundly acidotic. INR was elevated. An NG tube was placed and 700 cc of bilious material was evacuated. He is placed on mechanical ventilation and multiple vasopressors. A CT scan of the abdomen and pelvis demonstrated contrast which appeared to be leaking from the stomach. He is now brought to the operating room because of that for emergency exploratory laparotomy. The family is aware that he may not survive the operation or the postoperative course.    Procedure Detail:  He was brought to the operating room and placed supine on the operating table. General anesthesia was given. The abdominal wall was widely sterilely prepped and draped. There is a epigastric midline incision with staples. The staples were removed. Subcutaneous tissues were cut. Midline fascial sutures were cut. There is a large rush of air once the perineal cavity was entered. There is a large quantity of bilious fluid present in the abdominal cavity with some fibrinous debris. Approximately 2 L of fluid was evacuated.  The excision was extended superiorly. A self-retaining retractor was placed. The lesser  curvature of the stomach was identified. The staple line was noted. Large amount of bilious fluid was in this area. NG tube was in the stomach. Staple line was inspected and initially the midportion the staple line was suspicious but no definite defect could be seen. Air was insufflated into the stomach through the NG tube and stomach placed under water. The leak appeared and it was identified at the proximal portion of the staple line. The lesser sac was entered and the lesser curvature gently mobilized to better expose the area of leak. The leak was then closed with interrupted 2-0 silk sutures. The rest of the stomach was examined and there was no evidence of leak.  The NG tube was place so the tip was well distal to the repair.  Following this, multiple abdominal abscesses were identified and drained. These were in the pelvis, right midabdomen, right upper quadrant area, and left abdomen. There are also multiple interloop small bowel abscesses. These were drained.The entire small bowel was run. Fibrinous debris was densely adherent to the small bowel.  Following this, copious irrigation with warm saline was performed until the irrigant fluid was clear on return. A stab wound was placed in the right lower quadrant area. A 19 Blake drain was then placed through this stab wound and was positioned so it was adjacent to the anterior aspect of the repair. A stab wound was made in the left mid abdomen and a second #19 Blake drain was then placed into the abdominal cavity and positioned in the lesser sac posterior to the repair. The drains were anchored to the skin with 3-0  nylon suture.  The self retraining retractor was removed. The fascia was closed with double looped running #1 PDS suture. The subcutaneous tissue was packed with saline moistened gauze and a bulky dressing was applied. The drains were hooked up to closed bulb suction.  He tolerated the procedure without any apparent complications and was  taken back to the critical care unit in critical condition.  Estimated Blood Loss:  200 mL         Drains: # 19 Blake x 2  Blood Given: 2 units FFP , 1 unit PRBC         Specimens: none        Complications:  * No complications entered in OR log *         Disposition: ICU - intubated and critically ill.         Condition: unstable

## 2015-02-27 NOTE — Procedures (Signed)
Central Venous Catheter Insertion Procedure Note [name] Wood [id number] 14-Sep-1941  Procedure: Insertion of Central Venous Catheter Indications: Assessment of intravascular volume, Drug and/or fluid administration and Frequent blood sampling  Procedure Details Consent: Risks of procedure as well as the alternatives and risks of each were explained to the (patient/caregiver).  Consent for procedure obtained.   Time Out: Verified patient identification, verified procedure, site/side was marked, verified correct patient position, special equipment/implants available, medications/allergies/relevent history reviewed, required imaging and test results available.  Performed  Maximum sterile technique was used including antiseptics, cap, gloves, gown, hand hygiene, mask and sheet. Skin prep: Chlorhexidine; local anesthetic administered  A antimicrobial bonded/coated triple lumen catheter was placed in the right internal jugular vein to 16 cm using the Seldinger technique.  Sutured x3   Evaluation Blood flow good Complications: No apparent complications Patient did tolerate procedure well. Chest X-ray ordered to verify placement.  CXR: normal.    Procedure performed under direct supervision of Dr. Lamonte Sakai and with ultrasound guidance for real time vessel cannulation.      [name], NP-C [facility] Pgr: [phone] or [phone] 03/02/2015, 12:32 PM  Baltazar Apo, MD, PhD 02/23/2015, 12:35 PM [facility] [phone] or if no answer [phone]

## 2015-02-27 NOTE — Procedures (Signed)
Intubation Procedure Note Latonya Knight [id number] 1941-05-01  Procedure: Intubation Indications: Respiratory insufficiency  Procedure Details Consent: Unable to obtain consent because of emergent medical necessity. Time Out: Verified patient identification, verified procedure, site/side was marked, verified correct patient position, special equipment/implants available, medications/allergies/relevent history reviewed, required imaging and test results available.  Performed  Maximum sterile technique was used including antiseptics, cap, gloves, gown, hand hygiene and mask.  MAC and 4 - Glide. Dark emesis in posterior pharynx.  Cords visualized, ETT passed under direct visualization.     Evaluation Hemodynamic Status: Hypotension prior to intubation, cardiac arrest; O2 sats: fell prior to procedure, corrected with intubation.  Patient's Current Condition: stable Complications: Pt had emesis before and during the intubation, at high risk for aspiration  Patient did tolerate procedure well. Chest X-ray ordered to verify placement.  CXR: pending.    Procedure performed under direct supervision of Dr. Lamonte Sakai and with [facility] guidance for direct airway visualization.    [name], NP-C [facility] Pgr: [phone] or [phone] 03/04/2015  Baltazar Apo, MD, PhD 03/08/2015, 12:34 PM [facility] [phone] or if no answer [phone]

## 2015-02-27 NOTE — Procedures (Signed)
Intubation Procedure Note [name] Wood [id number] [birth year]  Procedure: Intubation Indications: Airway protection and maintenance  Procedure Details Consent: Risks of procedure as well as the alternatives and risks of each were explained to the (patient/caregiver).  Consent for procedure obtained. Time Out: Verified patient identification, verified procedure, site/side was marked, verified correct patient position, special equipment/implants available, medications/allergies/relevent history reviewed, required imaging and test results available.  Performed  Maximum sterile technique was used including antiseptics.  MAC 4    Evaluation Hemodynamic Status: BP stable throughout; O2 sats: stable throughout Patient's Current Condition: stable Complications: No apparent complications Patient did tolerate procedure well. Chest X-ray ordered to verify placement.  CXR: pending.   Irish Elders Royal 02/28/2015  2

## 2015-02-27 NOTE — Transfer of Care (Signed)
Immediate Anesthesia Transfer of Care Note  Patient: [name] Fife Sr.  Procedure(s) Performed: Procedure(s) (LRB): EXPLORATORY LAPAROTOMY, REPAIR OF GASTRIC STAPLE LINE LEAK (N/A)  Patient Location: ICU  Anesthesia Type: General  Level of Consciousness: sedated/paralyzed, patient unresponsive  Airway & Oxygen Therapy: Patient on ventilator and connected to 100% oxygen.  Post-op Assessment: Report given to ICU RN and Post -op Vital signs reviewed and stable  Post vital signs: Reviewed and stable. On vasopressors with stable vital signs.  Complications: No apparent anesthesia complications.

## 2015-02-27 NOTE — Anesthesia Preprocedure Evaluation (Addendum)
Anesthesia Evaluation  Patient identified by MRN, date of birth, ID band  Reviewed: Allergy & Precautions, NPO status , Patient's Chart, lab work & pertinent test results, Unable to perform ROS - Chart review only  Airway Mallampati: II  TM Distance: >3 FB Neck ROM: Full    Dental   Pulmonary shortness of breath, pneumonia -, unresolved,  LLL pneumonia     + intubated    Cardiovascular hypertension, Pt. on medications and Pt. on home beta blockers Rhythm:Regular Rate:Tachycardia  S/P cardiac arrest earlier this afternoon with 20 minutes of CPR and ROSC. On vasopressors: levophed, amiodarone, vasopressin. Acidotic.   Neuro/Psych negative neurological ROS  negative psych ROS   GI/Hepatic INR elevated. Waiting on FFP S/P resection of GIST tumor in [place]. Extravasation on NG contrast on CT scan suggestive of anastomosis dehiscence.   Endo/Other  Hypothyroidism   Renal/GU Renal Insufficiency and ARFRenal diseaseCr 5.5 K 4.0     Musculoskeletal  (+) Arthritis -,   Abdominal   Peds  Hematology   Anesthesia Other Findings   Reproductive/Obstetrics                       Anesthesia Physical Anesthesia Plan  ASA: V and emergent  Anesthesia Plan: General   Post-op Pain Management:    Induction: Intravenous  Airway Management Planned: Oral ETT  Additional Equipment: Arterial line and CVP  Intra-op Plan:   Post-operative Plan: Post-operative intubation/ventilation  Informed Consent: I have reviewed the patients History and Physical, chart, labs and discussed the procedure including the risks, benefits and alternatives for the proposed anesthesia with the patient or authorized representative who has indicated his/her understanding and acceptance.   Consent reviewed with POA and Only emergency history available  Plan Discussed with: CRNA  Anesthesia Plan Comments: (Arterial line and CVP line in  place from ICU. ASA 5E. Very high risk for cardiac arrest and death. Very acidotic on vasopressors Acute renal failure on top of chronic renal insufficiency. A discussion was had with Mr. Divirgilio's wife and daughters regarding the poor prognosis of this situation and they want [place] to proceed with surgery to try and save his life.)      Anesthesia Quick Evaluation

## 2015-02-27 NOTE — Progress Notes (Addendum)
Patient Demographics  [name] Wood, is a 74 y.o. male, DOB - 04-27-1941, KXF:[id number]  Admit date - 03/18/2015   Admitting Physician Jani Gravel, MD  Outpatient Primary MD for the patient is Kathlene November, MD  LOS - 1   Chief Complaint  Patient presents with  . Shortness of Breath      Admission history of present illness Dr. Jani Gravel. 74 yo male with hypertension, hyperlipidemia, recent resection of GIST apparently c/o dyspnea worsening over the past day. +chills, Denies fever, cough, cp, palp, n/v, diarrhea, brbpr, black stool. Pt was brought to ED for evaluation and found to have L pneumonia, and also ARF with creatinine 5.34. Pt will be admitted.   Patient was septic, hypotensive, critical care was consulted, during that evaluation, patient became unresponsive, oriented to cardiac arrest, and CPR was performed, patient was intubated.   Subjective:   [name] Wood hypotensive, ill-appearing, complains of shortness of breath.  Assessment & Plan    Active Problems:   ARF (acute renal failure)   Healthcare-associated pneumonia   Acute respiratory failure  Acute respiratory failure - Secondary to healthcare associated pneumonia. - VQ scan negative for PE.  Healthcare acquired pneumonia - On broad-spectrum IV antibiotics   Septic shock - Patient status post cardiopulmonary arrest, management as per pulmonary/critical team, currently intubated, on broad-spectrum antibiotics, CT abdomen and pelvis pending giving recent surgery, hypoactive bowel sounds.  Acute renal failure - Volume depletion, further management as per critical care team.   Code Status: Full  Family Communication: None at bedside        Consults   Pulmonary critical care.   Medications  Scheduled Meds: . azithromycin  500 mg Intravenous Daily  . etomidate      .  heparin  5,000 Units Subcutaneous 3 times per day  . imipenem-cilastatin  250 mg Intravenous Q12H  . [START ON 02/28/2015] levothyroxine  62.5 mcg Intravenous Daily  . lidocaine (cardiac) 100 mg/49ml      . rocuronium      . sodium chloride  3 mL Intravenous Q12H  . succinylcholine      . tamsulosin  0.4 mg Oral QHS  . [START ON 02/28/2015] vancomycin  1,000 mg Intravenous Q48H   Continuous Infusions: . sodium chloride 1,000 mL (03/20/2015 0852)  . amiodarone 60 mg/hr (03/04/2015 1235)   Followed by  . amiodarone    . norepinephrine (LEVOPHED) Adult infusion 50 mcg/min (02/23/2015 1333)  .  sodium bicarbonate 150 mEq in sterile water 1000 mL infusion 125 mL/hr at 02/28/2015 1355  . vasopressin (PITRESSIN) infusion - *FOR SHOCK* 0.03 Units/min (03/09/2015 1310)   PRN Meds:.acetaminophen **OR** acetaminophen, albuterol, fentaNYL, fentaNYL, midazolam, midazolam  DVT Prophylaxis   Heparin -  Lab Results  Component Value Date   PLT 424* 03/23/2015    Antibiotics    Anti-infectives    Start     Dose/Rate Route Frequency Ordered Stop   02/28/15 2200  levofloxacin (LEVAQUIN) IVPB 500 mg  Status:  Discontinued     500 mg 100 mL/hr over 60 Minutes Intravenous Every 48 hours 03/13/2015 2219 03/17/2015 0657   02/28/15 1800  vancomycin (VANCOCIN) IVPB 1000 mg/200 mL premix     1,000 mg 200 mL/hr over 60 Minutes Intravenous  Every 48 hours 02/28/2015 1251     03/11/2015 1400  imipenem-cilastatin (PRIMAXIN) 250 mg in sodium chloride 0.9 % 100 mL IVPB     250 mg 200 mL/hr over 30 Minutes Intravenous Every 12 hours 02/28/2015 1247     03/17/2015 0715  cefTRIAXone (ROCEPHIN) 1 g in dextrose 5 % 50 mL IVPB  Status:  Discontinued     1 g 100 mL/hr over 30 Minutes Intravenous Daily 03/13/2015 0657 03/05/2015 1243   03/09/2015 0715  azithromycin (ZITHROMAX) 500 mg in dextrose 5 % 250 mL IVPB     500 mg 250 mL/hr over 60 Minutes Intravenous Daily 03/20/2015 0657     02/25/2015 2300  levofloxacin (LEVAQUIN) IVPB 750 mg     750  mg 100 mL/hr over 90 Minutes Intravenous  Once 03/23/2015 2219 03/09/2015 0030   02/25/2015 1800  vancomycin (VANCOCIN) IVPB 1000 mg/200 mL premix     1,000 mg 200 mL/hr over 60 Minutes Intravenous STAT 03/04/2015 1749 02/24/2015 2101   03/10/2015 1730  ceFEPIme (MAXIPIME) 2 g in dextrose 5 % 50 mL IVPB     2 g 100 mL/hr over 30 Minutes Intravenous  Once 03/21/2015 1726 03/05/2015 1852          Objective:   Filed Vitals:   02/24/2015 1228 03/21/2015 1245 03/02/2015 1300 03/11/2015 1315  BP: 61/31     Pulse: 106 54 46   Temp:  96.6 F (35.9 C) 96.6 F (35.9 C) 95.7 F (35.4 C)  TempSrc:      Resp: 24 12 14 13   Height:      Weight:      SpO2:  91% 93%     Wt Readings from Last 3 Encounters:  03/13/2015 87.4 kg (192 lb 10.9 oz)  10/18/14 85.787 kg (189 lb 2 oz)  10/12/14 87.091 kg (192 lb)     Intake/Output Summary (Last 24 hours) at 03/10/2015 1416 Last data filed at 03/22/2015 0953  Gross per 24 hour  Intake   3670 ml  Output    250 ml  Net   3420 ml     Physical Exam  Ill appearing, communicative. Supple Neck,No JVD, Symmetrical Chest wall movement, fair air movement bilaterally, use of respiratory accessory muscles  ,No Gallops,Rubs or new Murmurs, No Parasternal Heave Decreased bowel sounds, surgical scar with staples present, once with no discharge or bleed. Extremities are cold, diaphoretic.   Data Review   Micro Results Recent Results (from the past 240 hour(s))  Culture, blood (routine x 2)     Status: None (Preliminary result)   Collection Time: 03/07/2015  5:49 PM  Result Value Ref Range Status   Specimen Description BLOOD RIGHT ANTECUBITAL  Final   Special Requests BOTTLES DRAWN AEROBIC AND ANAEROBIC 5 CC EACH  Final   Culture   Final           BLOOD CULTURE RECEIVED NO GROWTH TO DATE CULTURE WILL BE HELD FOR 5 DAYS BEFORE ISSUING A FINAL NEGATIVE REPORT Performed at [facility]    Report Status PENDING  Incomplete  Culture, blood (routine x 2)     Status:  None (Preliminary result)   Collection Time: 03/11/2015  5:49 PM  Result Value Ref Range Status   Specimen Description BLOOD LEFT ANTECUBITAL  Final   Special Requests BOTTLES DRAWN AEROBIC AND ANAEROBIC 5 CC EACH  Final   Culture   Final           BLOOD CULTURE RECEIVED NO GROWTH  TO DATE CULTURE WILL BE HELD FOR 5 DAYS BEFORE ISSUING A FINAL NEGATIVE REPORT Performed at [facility]    Report Status PENDING  Incomplete  MRSA PCR Screening     Status: None   Collection Time: 03/14/2015 11:37 PM  Result Value Ref Range Status   MRSA by PCR NEGATIVE NEGATIVE Final    Comment:        The GeneXpert MRSA Assay (FDA approved for NASAL specimens only), is one component of a comprehensive MRSA colonization surveillance program. It is not intended to diagnose MRSA infection nor to guide or monitor treatment for MRSA infections.     Radiology Reports US Renal  03/11/2015   CLINICAL DATA:  Initial evaluation for acute renal failure, history of prostate cancer  EXAM: RENAL/URINARY TRACT ULTRASOUND COMPLETE  COMPARISON:  None.  FINDINGS: Right Kidney:  Length: 10.9 cm. Mildly increased echogenicity. No hydronephrosis. Multiple cysts the largest measuring 4.5 cm.  Left Kidney:  Length: 9.8 cm. Mildly increased echogenicity. No hydronephrosis. Multiple cysts, the largest measuring 2.3 cm.  Bladder:  Not visualized.  Foley catheter is present.  There is a small volume of ascites.  IMPRESSION: Evidence of medical renal disease.  Bilateral renal cysts.   Electronically Signed   By: Skipper Cliche M.D.   On: 03/15/2015 10:43   Nm Pulmonary Perf And Vent  03/02/2015   CLINICAL DATA:  Shortness of breath.  Recent abdominal surgery.  EXAM: NUCLEAR MEDICINE VENTILATION - PERFUSION LUNG SCAN  TECHNIQUE: Ventilation images were obtained in multiple projections using inhaled aerosol technetium 99 M DTPA. Perfusion images were obtained in multiple projections after intravenous injection of Tc-59m MAA.   RADIOPHARMACEUTICALS:  40.0 mCi Tc-59m DTPA aerosol and 5.1 mCi Tc-3m MAA  COMPARISON:  Chest x-ray 02/23/2015  FINDINGS: Ventilation: Diffuse patchy heterogeneous ventilation study with clumping of the radiopharmaceutical.  Perfusion: No definite segmental or subsegmental perfusion defects to suggest pulmonary embolism.  IMPRESSION: Abnormal ventilation scan but no definite perfusion defects to suggest pulmonary embolism.   Electronically Signed   By: Marijo Sanes M.D.   On: 03/04/2015 20:35   Dg Chest Port 1 View  03/19/2015   CLINICAL DATA:  Status post intubation.  EXAM: PORTABLE CHEST - 1 VIEW  COMPARISON:  Same day.  FINDINGS: Stable cardiomediastinal silhouette. Endotracheal tube is in grossly good position with distal tip 3.6 cm above the carina. Nasogastric tube is seen looped within proximal stomach. No pneumothorax or significant pleural effusion is noted. Right internal jugular catheter is noted with distal tip overlying expected position of the SVC. Mild left basilar opacity is noted concerning for atelectasis or pneumonia. Mild right upper lobe opacity is noted concerning for edema or possibly pneumonia.  IMPRESSION: Endotracheal tube and nasogastric tube in grossly good position. Right internal jugular catheter tip seen in expected position of the SVC without evidence of pneumothorax. Increased right upper lobe opacity is noted concerning for edema or possibly pneumonia. Left basilar opacity is again noted concerning for atelectasis or pneumonia.   Electronically Signed   By: Marijo Conception, M.D.   On: 03/21/2015 12:33   Dg Chest Port 1 View  03/24/2015   CLINICAL DATA:  Shortness of breath and hypertension  EXAM: PORTABLE CHEST - 1 VIEW  COMPARISON:  June 13, 2013  FINDINGS: There is airspace consolidation in the left base. Lungs elsewhere clear. Heart is upper normal in size with pulmonary vascularity within normal limits. No adenopathy. There is postoperative change in the lower neck  region.   IMPRESSION: Left base pneumonia.   Electronically Signed   By: Lowella Grip III M.D.   On: 02/23/2015 15:52    CBC  Recent Labs Lab 02/25/2015 1539 02/26/2015 0345 03/17/2015 0959  WBC 11.8* 11.7* 17.4*  HGB 13.1 12.1* 11.0*  HCT 38.3* 36.8* 34.8*  PLT 522* 472* 424*  MCV 92.5 93.6 98.0  MCH 31.6 30.8 31.0  MCHC 34.2 32.9 31.6  RDW 13.2 13.4 13.8  LYMPHSABS  --  0.7  --   MONOABS  --  1.5*  --   EOSABS  --  0.0  --   BASOSABS  --  0.0  --     Chemistries   Recent Labs Lab 03/11/2015 1539 03/11/2015 2200 03/14/2015 0345 03/08/2015 1315  NA 136  --  133* PENDING  K 4.5  --  4.6 4.0  CL 98  --  102 105  CO2 17*  --  15* 15*  GLUCOSE 118*  --  97 107*  BUN 62*  --  69* 68*  CREATININE 5.34*  --  5.29* 5.50*  CALCIUM 8.2*  --  7.2* 7.1*  MG  --  2.2  --   --   AST  --   --  47*  --   ALT  --   --  36  --   ALKPHOS  --   --  55  --   BILITOT  --   --  1.5*  --    ------------------------------------------------------------------------------------------------------------------ estimated creatinine clearance is 12.6 mL/min (by C-G formula based on Cr of 5.5). ------------------------------------------------------------------------------------------------------------------ No results for input(s): HGBA1C in the last 72 hours. ------------------------------------------------------------------------------------------------------------------ No results for input(s): CHOL, HDL, LDLCALC, TRIG, CHOLHDL, LDLDIRECT in the last 72 hours. ------------------------------------------------------------------------------------------------------------------  Recent Labs  03/19/2015 1227  TSH 4.199   ------------------------------------------------------------------------------------------------------------------ No results for input(s): VITAMINB12, FOLATE, FERRITIN, TIBC, IRON, RETICCTPCT in the last 72 hours.  Coagulation profile No results for input(s): INR, PROTIME in the last 168  hours.  No results for input(s): DDIMER in the last 72 hours.  Cardiac Enzymes  Recent Labs Lab 02/28/2015 2200 03/14/2015 0345 02/26/2015 0945  TROPONINI 0.04* 0.04* 0.04*   ------------------------------------------------------------------------------------------------------------------ Invalid input(s): POCBNP     Time Spent in minutes   No charge   [facility], Hari Casaus M.D on 03/17/2015 at 2:16 PM  Between 7am to 7pm - Pager - [phone]  After 7pm go to [email or web address] - password [email or web address]  And look for the night coverage person covering for me after hours  [facility] Office  [phone]   **Disclaimer: This note may have been dictated with voice recognition software. Similar sounding words can inadvertently be transcribed and this note may contain transcription errors which may not have been corrected upon publication of note.**

## 2015-02-27 NOTE — Progress Notes (Signed)
  Amiodarone Drug - Drug Interaction Consult Note  Recommendations: Patient in on azithromycin which can prolong the QT. Recommend continued QT monitoring.  Amiodarone is metabolized by the cytochrome P450 system and therefore has the potential to cause many drug interactions. Amiodarone has an average plasma half-life of 50 days (range 20 to 100 days).   There is potential for drug interactions to occur several weeks or months after stopping treatment and the onset of drug interactions may be slow after initiating amiodarone.   []  Statins: Increased risk of myopathy. Simvastatin- restrict dose to 20mg  daily. Other statins: counsel patients to report any muscle pain or weakness immediately.  []  Anticoagulants: Amiodarone can increase anticoagulant effect. Consider warfarin dose reduction. Patients should be monitored closely and the dose of anticoagulant altered accordingly, remembering that amiodarone levels take several weeks to stabilize.  []  Antiepileptics: Amiodarone can increase plasma concentration of phenytoin, the dose should be reduced. Note that small changes in phenytoin dose can result in large changes in levels. Monitor patient and counsel on signs of toxicity.  []  Beta blockers: increased risk of bradycardia, AV block and myocardial depression. Sotalol - avoid concomitant use.  []   Calcium channel blockers (diltiazem and verapamil): increased risk of bradycardia, AV block and myocardial depression.  []   Cyclosporine: Amiodarone increases levels of cyclosporine. Reduced dose of cyclosporine is recommended.  []  Digoxin dose should be halved when amiodarone is started.  []  Diuretics: increased risk of cardiotoxicity if hypokalemia occurs.  []  Oral hypoglycemic agents (glyburide, glipizide, glimepiride): increased risk of hypoglycemia. Patient's glucose levels should be monitored closely when initiating amiodarone therapy.   [x]  Drugs that prolong the QT interval:  Torsades de  pointes risk may be increased with concurrent use - avoid if possible.  Monitor QTc, also keep magnesium/potassium WNL if concurrent therapy can't be avoided. Marland Kitchen Antibiotics: e.g. fluoroquinolones, erythromycin.  ---> Patient is on azithromycin. . Antiarrhythmics: e.g. quinidine, procainamide, disopyramide, sotalol. . Antipsychotics: e.g. phenothiazines, haloperidol.  . Lithium, tricyclic antidepressants, and methadone. Thank You,  Hershal Coria  03/05/2015 12:40 PM

## 2015-02-27 NOTE — Progress Notes (Signed)
Patient ID: [name] Fife Sr., male   DOB: January 27, 1941, 74 y.o.   MRN: [id number] CT shows free leak of contrast from stomach. I have talked to the family in detail about the different options for treatment and the poor prognosis and they understand. They would like to give him a chance so they choose surgery. They understand he may die no matter what we do. Will give FFP and plan for surgery tonight

## 2015-02-27 NOTE — Progress Notes (Signed)
[facility] Progress Note Patient Name: [name] PLOTTS Sr. DOB: 08/24/1941 MRN: [id number]   Date of Service  03/20/2015  HPI/Events of Note  Cardiac arrest in setting of septic shock, multiple intra-abdominal abscesses, now s/p abscess drainage and leak repair, back from OR Still in shock, but levophed requirement improving Still in Afib with HR 110's  eICU Interventions  Repeat labs Wean levophed May change to neo, watch HR as levophed weaning     Intervention Category Major Interventions: Shock - evaluation and management  MCQUAID, DOUGLAS 03/17/2015, 11:27 PM

## 2015-02-27 NOTE — Progress Notes (Addendum)
Spiritual care consulted for support during code event.    Provided extended support with spouse, children, extended family.  Present for conference with care team and follow up support with family.      In addition to anticipation / anxiety around code, Pt's spouse, Enid Derry, spoke briefly with chaplain about level of care she and the patient desires and goals of care.  Enid Derry reports pt had expressed after previous surgery that he "does not want to go through that again."  Processed some feelings of responsibility / guilt with chaplain during code for "bringing him to hospital when he did not want to come."    She did not reveal these sentiments with other family members present.    Currently there is large extended family and friends gathered.  Chaplain will continue to follow for support.      Please page if needs arise.   [place], [place]

## 2015-02-27 NOTE — Consult Note (Signed)
Reason for Consult: s/p partial gastrectomy, lactic acidosis, leukocytosis  Referring Physician: Dr. 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  He was found to have pneumonia and admitted for IV antibiotics, acute renal failure and possible early sepsis.    This morning, the patient was noted to have a change in mental status and a rising lactate.  CCM was consulted and shortly thereafter, the patient further decompensated with cardiopulmonary arrest requing 20 minutes of ACLS with ROSC.   sCr 5.5 this am with baseline ~1.39.  White count on admission was 11.8, now 17.4k.  Lactic acid  5.51 on admission, 8.5 this AM and now 10.6.  Blood cultures are negative to date.  The patient is intubated, on vasopressin.  OG placed with approximately 730m of dark bilious output.  He has been unstable to go down for a CT of abdomen and pelvis.  No fevers.   According to nurses.  He had a BM prior to admission and was not complaining of any abdominal pain.    The patient is critically ill on the vent.  No family was present.  All history was obtained through records.   Past Medical History  Diagnosis Date  . Allergy     rhinitis  . Hyperlipidemia   . Hypertension   . Osteoarthritis   . Erectile dysfunction   . Overweight(278.02)   . Renal insufficiency     baseline cr 1.5 11-06; cr 1.3 on 5-02  . Prostate cancer 2-11    prostate  . Prostate ca 2013    seeding implant  . Thyroid goiter 05-31-13    multinodular, Dr GCruzita Lederer . Gastrointestinal stromal tumor (GIST)     Past Surgical History  Procedure Laterality Date  . Tonsillectomy  1967  . Radioactive seed implant  ~2012  . Hernia repair  2009    right  . Thyroidectomy N/A  06/03/2013    Procedure: TOTAL THYROIDECTOMY;  Surgeon: TEarnstine Regal MD;  Location: [facility] ORS;  Service: General;  Laterality: N/A;  . Stomach surgery  2016    removal of GIST    Family History  Problem Relation Age of Onset  . Alzheimer's disease Mother   . Diabetes Father   . Kidney disease Father   . Cancer Sister     ? type  . Colon cancer Neg Hx   . Prostate cancer Neg Hx   . CAD Neg Hx     Social History:  reports that he has never smoked. He has never used smokeless tobacco. He reports that he does not drink alcohol or use illicit drugs.  Allergies: No Known Allergies  Medications:  No current facility-administered medications on file prior to encounter.   Current Outpatient Prescriptions on File Prior to Encounter  Medication Sig Dispense Refill  . Calcium Carbonate-Vitamin D (CALCIUM-VITAMIN D) 500-200 MG-UNIT per tablet Take 1 tablet by mouth daily.     . carvedilol (COREG) 25 MG tablet Take 1 tablet (25 mg total) by mouth 2 (two) times daily with a meal. 180 tablet 1  . cholecalciferol (VITAMIN D) 1000 UNITS tablet Take 1,000 Units by mouth daily.    .Marland Kitchenlevothyroxine (SYNTHROID, LEVOTHROID) 125 MCG tablet Take 1  tablet (125 mcg total) by mouth daily. 45 tablet 3  . mometasone (NASONEX) 50 MCG/ACT nasal spray Place 2 sprays into the nose daily as needed (rhinitis).     . multivitamin (ONE-A-DAY MEN'S) TABS Take 1 tablet by mouth daily.      . polyvinyl alcohol (LIQUIFILM TEARS) 1.4 % ophthalmic solution Place 1 drop into both eyes daily as needed (for allergies).    . simvastatin (ZOCOR) 20 MG tablet Take 1 tablet (20 mg total) by mouth at bedtime. 90 tablet 1  . tamsulosin (FLOMAX) 0.4 MG CAPS capsule TAKE ONE CAPSULE BY MOUTH AT BEDTIME 30 capsule 0  . triamterene-hydrochlorothiazide (MAXZIDE-25) 37.5-25 MG per tablet TAKE ONE TABLET BY MOUTH ONCE DAILY 30 tablet 5     Results for orders placed or performed during the hospital encounter of 03/22/2015 (from the past 48  hour(s))  CBC     Status: Abnormal   Collection Time: 03/16/2015  3:39 PM  Result Value Ref Range   WBC 11.8 (H) 4.0 - 10.5 K/uL   RBC 4.14 (L) 4.22 - 5.81 MIL/uL   Hemoglobin 13.1 13.0 - 17.0 g/dL   HCT 38.3 (L) 39.0 - 52.0 %   MCV 92.5 78.0 - 100.0 fL   MCH 31.6 26.0 - 34.0 pg   MCHC 34.2 30.0 - 36.0 g/dL   RDW 13.2 11.5 - 15.5 %   Platelets 522 (H) 150 - 400 K/uL  Basic metabolic panel     Status: Abnormal   Collection Time: 03/13/2015  3:39 PM  Result Value Ref Range   Sodium 136 135 - 145 mmol/L    Comment: REPEATED TO VERIFY   Potassium 4.5 3.5 - 5.1 mmol/L    Comment: REPEATED TO VERIFY   Chloride 98 96 - 112 mmol/L    Comment: REPEATED TO VERIFY   CO2 17 (L) 19 - 32 mmol/L    Comment: REPEATED TO VERIFY   Glucose, Bld 118 (H) 70 - 99 mg/dL   BUN 62 (H) 6 - 23 mg/dL   Creatinine, Ser 5.34 (H) 0.50 - 1.35 mg/dL    Comment: REPEATED TO VERIFY   Calcium 8.2 (L) 8.4 - 10.5 mg/dL    Comment: REPEATED TO VERIFY   GFR calc non Af Amer 10 (L) >90 mL/min   GFR calc Af Amer 11 (L) >90 mL/min    Comment: (NOTE) The eGFR has been calculated using the CKD EPI equation. This calculation has not been validated in all clinical situations. eGFR's persistently <90 mL/min signify possible Chronic Kidney Disease.    Anion gap 21 (H) 5 - 15    Comment: REPEATED TO VERIFY  Brain natriuretic peptide (only with dyspnea)     Status: Abnormal   Collection Time: 03/09/2015  3:39 PM  Result Value Ref Range   B Natriuretic Peptide 116.9 (H) 0.0 - 100.0 pg/mL  I-stat troponin, ED (not at [facility])     Status: None   Collection Time: 03/21/2015  3:45 PM  Result Value Ref Range   Troponin i, poc 0.04 0.00 - 0.08 ng/mL   Comment 3            Comment: Due to the release kinetics of cTnI, a negative result within the first hours of the onset of symptoms does not rule out myocardial infarction with certainty. If myocardial infarction is still suspected, repeat the test at appropriate intervals.    Culture, blood (routine x 2)     Status: None (Preliminary result)   Collection  Time: 03/14/2015  5:49 PM  Result Value Ref Range   Specimen Description BLOOD RIGHT ANTECUBITAL    Special Requests BOTTLES DRAWN AEROBIC AND ANAEROBIC 5 CC EACH    Culture             BLOOD CULTURE RECEIVED NO GROWTH TO DATE CULTURE WILL BE HELD FOR 5 DAYS BEFORE ISSUING A FINAL NEGATIVE REPORT Performed at [facility]    Report Status PENDING   Culture, blood (routine x 2)     Status: None (Preliminary result)   Collection Time: 03/22/2015  5:49 PM  Result Value Ref Range   Specimen Description BLOOD LEFT ANTECUBITAL    Special Requests BOTTLES DRAWN AEROBIC AND ANAEROBIC 5 CC EACH    Culture             BLOOD CULTURE RECEIVED NO GROWTH TO DATE CULTURE WILL BE HELD FOR 5 DAYS BEFORE ISSUING A FINAL NEGATIVE REPORT Performed at [facility]    Report Status PENDING   I-Stat CG4 Lactic Acid, ED     Status: Abnormal   Collection Time: 03/23/2015  5:54 PM  Result Value Ref Range   Lactic Acid, Venous 5.51 (HH) 0.5 - 2.0 mmol/L   Comment NOTIFIED PHYSICIAN   Blood gas, arterial     Status: Abnormal   Collection Time: 03/08/2015  5:56 PM  Result Value Ref Range   O2 Content 2.0 L/min   Delivery systems NASAL CANNULA    pH, Arterial 7.405 7.350 - 7.450   pCO2 arterial 23.1 (L) 35.0 - 45.0 mmHg   pO2, Arterial 64.3 (L) 80.0 - 100.0 mmHg   Bicarbonate 14.2 (L) 20.0 - 24.0 mEq/L   TCO2 12.9 0 - 100 mmol/L   Acid-base deficit 8.7 (H) 0.0 - 2.0 mmol/L   O2 Saturation 90.4 %   Patient temperature 98.6    Collection site RIGHT RADIAL    Drawn by [phone]    Sample type ARTERIAL DRAW    Allens test (pass/fail) PASS PASS  TSH     Status: Abnormal   Collection Time: 03/07/2015 10:00 PM  Result Value Ref Range   TSH 6.370 (H) 0.350 - 4.500 uIU/mL  Troponin I (q 6hr x 3)     Status: Abnormal   Collection Time: 03/20/2015 10:00 PM  Result Value Ref Range   Troponin I 0.04 (H) <0.031 ng/mL    Comment:         PERSISTENTLY INCREASED TROPONIN VALUES IN THE RANGE OF 0.04-0.49 ng/mL CAN BE SEEN IN:       -UNSTABLE ANGINA       -CONGESTIVE HEART FAILURE       -MYOCARDITIS       -CHEST TRAUMA       -ARRYHTHMIAS       -LATE PRESENTING MYOCARDIAL INFARCTION       -COPD   CLINICAL FOLLOW-UP RECOMMENDED.   Magnesium     Status: None   Collection Time: 03/17/2015 10:00 PM  Result Value Ref Range   Magnesium 2.2 1.5 - 2.5 mg/dL  Phosphorus     Status: Abnormal   Collection Time: 03/12/2015 10:00 PM  Result Value Ref Range   Phosphorus 5.2 (H) 2.3 - 4.6 mg/dL  MRSA PCR Screening     Status: None   Collection Time: 03/16/2015 11:37 PM  Result Value Ref Range   MRSA by PCR NEGATIVE NEGATIVE    Comment:        The GeneXpert MRSA Assay (FDA approved for NASAL  specimens only), is one component of a comprehensive MRSA colonization surveillance program. It is not intended to diagnose MRSA infection nor to guide or monitor treatment for MRSA infections.   Sodium, urine, random     Status: None   Collection Time: 03/04/2015  1:12 AM  Result Value Ref Range   Sodium, Ur 18 mEq/L    Comment: Performed at [facility] / creatinine ratio, urine     Status: None   Collection Time: 03/05/2015  1:12 AM  Result Value Ref Range   Calcium, Ur <1 mg/dL    Comment: Result repeated and verified.   Creatinine, Urine 226.9 mg/dL    Comment: No reference range established.   Calcium/Creat.Ratio NOT CALCULATED DUE TO CA <1     Comment: Performed at [facility]  CBC with Differential/Platelet     Status: Abnormal   Collection Time: 03/10/2015  3:45 AM  Result Value Ref Range   WBC 11.7 (H) 4.0 - 10.5 K/uL   RBC 3.93 (L) 4.22 - 5.81 MIL/uL   Hemoglobin 12.1 (L) 13.0 - 17.0 g/dL   HCT 36.8 (L) 39.0 - 52.0 %   MCV 93.6 78.0 - 100.0 fL   MCH 30.8 26.0 - 34.0 pg   MCHC 32.9 30.0 - 36.0 g/dL   RDW 13.4 11.5 - 15.5 %   Platelets 472 (H) 150 - 400 K/uL   Neutrophils Relative % 46 43 -  77 %   Lymphocytes Relative 6 (L) 12 - 46 %   Monocytes Relative 13 (H) 3 - 12 %   Eosinophils Relative 0 0 - 5 %   Basophils Relative 0 0 - 1 %   Band Neutrophils 31 (H) 0 - 10 %   Metamyelocytes Relative 2 %   Myelocytes 2 %   Promyelocytes Absolute 0 %   Blasts 0 %   nRBC 1 (H) 0 /100 WBC   Neutro Abs 9.5 (H) 1.7 - 7.7 K/uL   Lymphs Abs 0.7 0.7 - 4.0 K/uL   Monocytes Absolute 1.5 (H) 0.1 - 1.0 K/uL   Eosinophils Absolute 0.0 0.0 - 0.7 K/uL   Basophils Absolute 0.0 0.0 - 0.1 K/uL   RBC Morphology POLYCHROMASIA PRESENT     Comment: RARE NRBCs   WBC Morphology INCREASED BANDS (>20% BANDS)     Comment: MILD LEFT SHIFT (1-5% METAS, OCC MYELO, OCC BANDS) TOXIC GRANULATION   Comprehensive metabolic panel     Status: Abnormal   Collection Time: 03/08/2015  3:45 AM  Result Value Ref Range   Sodium 133 (L) 135 - 145 mmol/L   Potassium 4.6 3.5 - 5.1 mmol/L   Chloride 102 96 - 112 mmol/L   CO2 15 (L) 19 - 32 mmol/L   Glucose, Bld 97 70 - 99 mg/dL   BUN 69 (H) 6 - 23 mg/dL   Creatinine, Ser 5.29 (H) 0.50 - 1.35 mg/dL   Calcium 7.2 (L) 8.4 - 10.5 mg/dL   Total Protein 6.2 6.0 - 8.3 g/dL   Albumin 2.7 (L) 3.5 - 5.2 g/dL   AST 47 (H) 0 - 37 U/L   ALT 36 0 - 53 U/L   Alkaline Phosphatase 55 39 - 117 U/L   Total Bilirubin 1.5 (H) 0.3 - 1.2 mg/dL   GFR calc non Af Amer 10 (L) >90 mL/min   GFR calc Af Amer 11 (L) >90 mL/min    Comment: (NOTE) The eGFR has been calculated using the CKD EPI equation. This calculation  has not been validated in all clinical situations. eGFR's persistently <90 mL/min signify possible Chronic Kidney Disease.    Anion gap 16 (H) 5 - 15  Troponin I (q 6hr x 3)     Status: Abnormal   Collection Time: 03/14/2015  3:45 AM  Result Value Ref Range   Troponin I 0.04 (H) <0.031 ng/mL    Comment:        PERSISTENTLY INCREASED TROPONIN VALUES IN THE RANGE OF 0.04-0.49 ng/mL CAN BE SEEN IN:       -UNSTABLE ANGINA       -CONGESTIVE HEART FAILURE        -MYOCARDITIS       -CHEST TRAUMA       -ARRYHTHMIAS       -LATE PRESENTING MYOCARDIAL INFARCTION       -COPD   CLINICAL FOLLOW-UP RECOMMENDED.   Urinalysis, Routine w reflex microscopic     Status: Abnormal   Collection Time: 03/14/2015  8:40 AM  Result Value Ref Range   Color, Urine RED (A) YELLOW    Comment: BIOCHEMICALS MAY BE AFFECTED BY COLOR   APPearance TURBID (A) CLEAR   Specific Gravity, Urine 1.031 (H) 1.005 - 1.030   pH 5.0 5.0 - 8.0   Glucose, UA NEGATIVE NEGATIVE mg/dL   Hgb urine dipstick LARGE (A) NEGATIVE   Bilirubin Urine SMALL (A) NEGATIVE   Ketones, ur 15 (A) NEGATIVE mg/dL   Protein, ur 100 (A) NEGATIVE mg/dL   Urobilinogen, UA 1.0 0.0 - 1.0 mg/dL   Nitrite POSITIVE (A) NEGATIVE   Leukocytes, UA SMALL (A) NEGATIVE  Urine microscopic-add on     Status: Abnormal   Collection Time: 03/24/2015  8:40 AM  Result Value Ref Range   WBC, UA 7-10 <3 WBC/hpf   RBC / HPF 3-6 <3 RBC/hpf   Bacteria, UA MANY (A) RARE   Urine-Other AMORPHOUS URATES/PHOSPHATES     Comment: SPERM PRESENT  Troponin I (q 6hr x 3)     Status: Abnormal   Collection Time: 03/09/2015  9:45 AM  Result Value Ref Range   Troponin I 0.04 (H) <0.031 ng/mL    Comment:        PERSISTENTLY INCREASED TROPONIN VALUES IN THE RANGE OF 0.04-0.49 ng/mL CAN BE SEEN IN:       -UNSTABLE ANGINA       -CONGESTIVE HEART FAILURE       -MYOCARDITIS       -CHEST TRAUMA       -ARRYHTHMIAS       -LATE PRESENTING MYOCARDIAL INFARCTION       -COPD   CLINICAL FOLLOW-UP RECOMMENDED.   Lactic acid, plasma     Status: Abnormal   Collection Time: 03/08/2015  9:45 AM  Result Value Ref Range   Lactic Acid, Venous 8.5 (HH) 0.5 - 2.0 mmol/L    Comment: REPEATED TO VERIFY CRITICAL RESULT CALLED TO, READ BACK BY AND VERIFIED WITH: SEEL,S. RN AT 1102 03/11/2015 MULLINS,T   Procalcitonin - Baseline     Status: None   Collection Time: 02/25/2015  9:45 AM  Result Value Ref Range   Procalcitonin 71.96 ng/mL    Comment:         Interpretation: PCT >= 10 ng/mL: Important systemic inflammatory response, almost exclusively due to severe bacterial sepsis or septic shock. (NOTE)         ICU PCT Algorithm               Non ICU PCT Algorithm    ----------------------------     ------------------------------  PCT < 0.25 ng/mL                 PCT < 0.1 ng/mL     Stopping of antibiotics            Stopping of antibiotics       strongly encouraged.               strongly encouraged.    ----------------------------     ------------------------------       PCT level decrease by               PCT < 0.25 ng/mL       >= 80% from peak PCT       OR PCT 0.25 - 0.5 ng/mL          Stopping of antibiotics                                             encouraged.     Stopping of antibiotics           encouraged.    ----------------------------     ------------------------------       PCT level decrease by              PCT >= 0.25 ng/mL       < 80% from peak PCT        AND PCT >= 0.5 ng/mL             Continuing antibiotics                                              encouraged.       Continuing antibiotics            encouraged.    ----------------------------     ------------------------------     PCT level increase compared          PCT > 0.5 ng/mL         with peak PCT AND          PCT >= 0.5 ng/mL             Escalation of antibiotics                                          strongly encouraged.      Escalation of antibiotics        strongly encouraged.   CBC     Status: Abnormal   Collection Time: 03/07/2015  9:59 AM  Result Value Ref Range   WBC 17.4 (H) 4.0 - 10.5 K/uL   RBC 3.55 (L) 4.22 - 5.81 MIL/uL   Hemoglobin 11.0 (L) 13.0 - 17.0 g/dL   HCT 34.8 (L) 39.0 - 52.0 %   MCV 98.0 78.0 - 100.0 fL   MCH 31.0 26.0 - 34.0 pg   MCHC 31.6 30.0 - 36.0 g/dL   RDW 13.8 11.5 - 15.5 %   Platelets 424 (H) 150 - 400 K/uL  Protime-INR     Status: Abnormal   Collection Time: 03/18/2015  9:59 AM  Result Value Ref Range    Prothrombin Time 22.8 (H)  11.6 - 15.2 seconds   INR 2.00 (H) 0.00 - 1.49  TSH     Status: None   Collection Time: 03/11/2015 12:27 PM  Result Value Ref Range   TSH 4.199 0.350 - 4.500 uIU/mL  Lactic acid, plasma     Status: Abnormal   Collection Time: 03/23/2015  1:15 PM  Result Value Ref Range   Lactic Acid, Venous 10.6 (HH) 0.5 - 2.0 mmol/L    Comment: REPEATED TO VERIFY CRITICAL RESULT CALLED TO, READ BACK BY AND VERIFIED WITH: SEEL,S @ 1443 ON [id number] BY POTEAT,S   Basic metabolic panel     Status: Abnormal   Collection Time: 03/20/2015  1:15 PM  Result Value Ref Range   Sodium 141 135 - 145 mmol/L    Comment: REPEATED TO VERIFY DELTA CHECK NOTED    Potassium 4.0 3.5 - 5.1 mmol/L   Chloride 105 96 - 112 mmol/L   CO2 15 (L) 19 - 32 mmol/L   Glucose, Bld 107 (H) 70 - 99 mg/dL   BUN 68 (H) 6 - 23 mg/dL   Creatinine, Ser 5.50 (H) 0.50 - 1.35 mg/dL   Calcium 7.1 (L) 8.4 - 10.5 mg/dL   GFR calc non Af Amer 9 (L) >90 mL/min   GFR calc Af Amer 11 (L) >90 mL/min    Comment: (NOTE) The eGFR has been calculated using the CKD EPI equation. This calculation has not been validated in all clinical situations. eGFR's persistently <90 mL/min signify possible Chronic Kidney Disease.    Anion gap 21 (H) 5 - 15  Blood gas, arterial     Status: Abnormal   Collection Time: 03/06/2015  1:30 PM  Result Value Ref Range   FIO2 1.00 %   Delivery systems VENTILATOR    Mode PRESSURE REGULATED VOLUME CONTROL    VT 600 mL   Rate 24 resp/min   Peep/cpap 5.0 cm H20   pH, Arterial 7.056 (LL) 7.350 - 7.450    Comment: RBV SUSAN SEEL,RN AT 13:35 BY MJACKSON,RRT,RCP ON 4 CRITICAL RESULT CALLED TO, READ BACK BY AND VERIFIED WITH: S.SEEL,RN AT 13:35 BY MJACKSON,RRT,RCP ON 02/23/2015    pCO2 arterial 47.9 (H) 35.0 - 45.0 mmHg   pO2, Arterial 149.0 (H) 80.0 - 100.0 mmHg   Bicarbonate 12.8 (L) 20.0 - 24.0 mEq/L   TCO2 13.2 0 - 100 mmol/L   Acid-base deficit 16.4 (H) 0.0 - 2.0 mmol/L   O2 Saturation 97.1 %    Patient temperature 98.6    Collection site A-LINE    Drawn by [id number]    Sample type ARTERIAL DRAW    Allens test (pass/fail) PASS PASS    US Renal  03/07/2015   CLINICAL DATA:  Initial evaluation for acute renal failure, history of prostate cancer  EXAM: RENAL/URINARY TRACT ULTRASOUND COMPLETE  COMPARISON:  None.  FINDINGS: Right Kidney:  Length: 10.9 cm. Mildly increased echogenicity. No hydronephrosis. Multiple cysts the largest measuring 4.5 cm.  Left Kidney:  Length: 9.8 cm. Mildly increased echogenicity. No hydronephrosis. Multiple cysts, the largest measuring 2.3 cm.  Bladder:  Not visualized.  Foley catheter is present.  There is a small volume of ascites.  IMPRESSION: Evidence of medical renal disease.  Bilateral renal cysts.   Electronically Signed   By: Skipper Cliche M.D.   On: 03/12/2015 10:43   Nm Pulmonary Perf And Vent  02/24/2015   CLINICAL DATA:  Shortness of breath.  Recent abdominal surgery.  EXAM: NUCLEAR MEDICINE VENTILATION - PERFUSION LUNG SCAN  TECHNIQUE: Ventilation images were obtained in multiple projections using inhaled aerosol technetium 99 M DTPA. Perfusion images were obtained in multiple projections after intravenous injection of Tc-42mMAA.  RADIOPHARMACEUTICALS:  40.0 mCi Tc-98mTPA aerosol and 5.1 mCi Tc-9922mA  COMPARISON:  Chest x-ray 02/28/2015  FINDINGS: Ventilation: Diffuse patchy heterogeneous ventilation study with clumping of the radiopharmaceutical.  Perfusion: No definite segmental or subsegmental perfusion defects to suggest pulmonary embolism.  IMPRESSION: Abnormal ventilation scan but no definite perfusion defects to suggest pulmonary embolism.   Electronically Signed   By: P. Marijo SanesD.   On: 03/07/2015 20:35   Dg Chest Port 1 View  03/17/2015   CLINICAL DATA:  Status post intubation.  EXAM: PORTABLE CHEST - 1 VIEW  COMPARISON:  Same day.  FINDINGS: Stable cardiomediastinal silhouette. Endotracheal tube is in grossly good position with distal  tip 3.6 cm above the carina. Nasogastric tube is seen looped within proximal stomach. No pneumothorax or significant pleural effusion is noted. Right internal jugular catheter is noted with distal tip overlying expected position of the SVC. Mild left basilar opacity is noted concerning for atelectasis or pneumonia. Mild right upper lobe opacity is noted concerning for edema or possibly pneumonia.  IMPRESSION: Endotracheal tube and nasogastric tube in grossly good position. Right internal jugular catheter tip seen in expected position of the SVC without evidence of pneumothorax. Increased right upper lobe opacity is noted concerning for edema or possibly pneumonia. Left basilar opacity is again noted concerning for atelectasis or pneumonia.   Electronically Signed   By: JamMarijo Conception.D.   On: 03/08/2015 12:33   Dg Chest Port 1 View  03/10/2015   CLINICAL DATA:  Shortness of breath and hypertension  EXAM: PORTABLE CHEST - 1 VIEW  COMPARISON:  June 13, 2013  FINDINGS: There is airspace consolidation in the left base. Lungs elsewhere clear. Heart is upper normal in size with pulmonary vascularity within normal limits. No adenopathy. There is postoperative change in the lower neck region.  IMPRESSION: Left base pneumonia.   Electronically Signed   By: WilLowella GripI M.D.   On: 03/07/2015 15:52    Review of Systems  Unable to perform ROS  Blood pressure 61/31, pulse 25, temperature 89.1 F (31.7 C), temperature source Core (Comment), resp. rate 15, height 5' 11"  (1.803 m), weight 87.4 kg (192 lb 10.9 oz), SpO2 100 %. Physical Exam  Constitutional: He appears ill. He is intubated.  Cardiovascular: Normal rate, regular rhythm, normal heart sounds and intact distal pulses.  Exam reveals no gallop and no friction rub.   No murmur heard. Respiratory: He is intubated. No respiratory distress.  GI:  Hypoactive BS, distended, tympanic.  Midline incision and LUQ with stapes in place.  Non tender(given  versed about 1 hour prior to exam)  Musculoskeletal: He exhibits no edema.  Neurological: He is unresponsive.    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 TepRaul Delardiopulmonary arrest Lactic acidosis Respiratory failure  LLL pneumonia an probable aspiration PNA AKI  Will await CT of abdomen. Further recommendations will be based on the results.  He will need to be stabilized if he needs surgery and we will need to have a discussion with family given as his mortality rate is high and would also be at high risk for surgical complications given recent surgery.  Agree with OG decompression.  Will follow up closely.     Demari Kropp ANP-BC 03/14/2015, 2:53 PM

## 2015-02-27 NOTE — Progress Notes (Signed)
[facility] Progress Note Patient Name: [name] STRAUSBAUGH Sr. DOB: [birth year] MRN: [id number]   Date of Service  03/09/2015  HPI/Events of Note  Hypoglycemic Myoclonic activity observed on camera  eICU Interventions  D 50 x 2 amps D10 @ 50/h Ativan 2 mg Going to OR     Intervention Category Major Interventions: Seizures - evaluation and management  Dailynn Nancarrow V. 03/19/2015, 7:53 PM

## 2015-02-27 NOTE — Progress Notes (Signed)
I have seen and examined [name] Wood and discussed his situation with Dr. Marlou Starks.  Chart reviewed.  CT reviewed.  He has a gastric leak post partial gastrectomy for gastric tumor 6 days ago. He is in multiorgan system failure on maximum support. His only chance of survival, which in my opinion is small, is exploratory laparotomy and repair of the leak. If he survives the operation, he may need others if he is left with an open abdomen. I have had a long discussion with his family about this. They understand he may not survive the operation and may die in the operating room. The also understand he may not survive the overall process. Given that there is no chance of survival without surgery, they would like to proceed. We had a long discussion about the procedure and possible risks which include but are not limited to uncontrollable bleeding, need for recurrent operations, breakdown of repair to name a few. They understand this and understand the gravity of the situation.  We also discussed the fact that he had 20 minutes of CPR and he may have suffered an anoxic brain injury.

## 2015-02-27 NOTE — Progress Notes (Signed)
CRITICAL VALUE ALERT  Critical value received:  Lactic  Acid 10.6  Date of notification:  02/25/2015  Time of notification:  4715  Critical value read back:yes  Nurse who received alert:  Clarene Duke RN MD notified (1st page):  E link Dr. Elsworth Soho  Time of first page: 1515  MD notified (2nd page):  Time of second page:  Responding MD: e link RN  Time MD responded:  [phone]

## 2015-02-28 ENCOUNTER — Inpatient Hospital Stay (HOSPITAL_COMMUNITY)
Admit: 2015-02-28 | Discharge: 2015-02-28 | Disposition: A | Discharge status: Home / Self Care | Payer: PPO | Attending: Pulmonary Disease | Admitting: Pulmonary Disease

## 2015-02-28 ENCOUNTER — Encounter (HOSPITAL_COMMUNITY): Payer: Self-pay | Admitting: General Surgery

## 2015-02-28 ENCOUNTER — Inpatient Hospital Stay (HOSPITAL_COMMUNITY): Payer: PPO

## 2015-02-28 DIAGNOSIS — G931 Anoxic brain damage, not elsewhere classified: Secondary | ICD-10-CM | POA: Diagnosis not present

## 2015-02-28 DIAGNOSIS — E785 Hyperlipidemia, unspecified: Secondary | ICD-10-CM

## 2015-02-28 DIAGNOSIS — R6521 Severe sepsis with septic shock: Secondary | ICD-10-CM

## 2015-02-28 DIAGNOSIS — A419 Sepsis, unspecified organism: Secondary | ICD-10-CM | POA: Diagnosis present

## 2015-02-28 DIAGNOSIS — T8149XA Infection following a procedure, other surgical site, initial encounter: Secondary | ICD-10-CM

## 2015-02-28 DIAGNOSIS — J9600 Acute respiratory failure, unspecified whether with hypoxia or hypercapnia: Secondary | ICD-10-CM

## 2015-02-28 DIAGNOSIS — D509 Iron deficiency anemia, unspecified: Secondary | ICD-10-CM

## 2015-02-28 DIAGNOSIS — K659 Peritonitis, unspecified: Secondary | ICD-10-CM

## 2015-02-28 DIAGNOSIS — D649 Anemia, unspecified: Secondary | ICD-10-CM | POA: Diagnosis present

## 2015-02-28 DIAGNOSIS — I10 Essential (primary) hypertension: Secondary | ICD-10-CM

## 2015-02-28 DIAGNOSIS — D696 Thrombocytopenia, unspecified: Secondary | ICD-10-CM

## 2015-02-28 DIAGNOSIS — B49 Unspecified mycosis: Secondary | ICD-10-CM | POA: Diagnosis present

## 2015-02-28 DIAGNOSIS — T814XXA Infection following a procedure, initial encounter: Principal | ICD-10-CM

## 2015-02-28 DIAGNOSIS — C49A2 Gastrointestinal stromal tumor of stomach: Secondary | ICD-10-CM | POA: Diagnosis present

## 2015-02-28 DIAGNOSIS — E162 Hypoglycemia, unspecified: Secondary | ICD-10-CM

## 2015-02-28 DIAGNOSIS — T8143XA Infection following a procedure, organ and space surgical site, initial encounter: Secondary | ICD-10-CM | POA: Diagnosis present

## 2015-02-28 LAB — BASIC METABOLIC PANEL
ANION GAP: 19 — AB (ref 5–15)
Anion gap: 17 — ABNORMAL HIGH (ref 5–15)
Anion gap: 20 — ABNORMAL HIGH (ref 5–15)
BUN: 68 mg/dL — ABNORMAL HIGH (ref 6–23)
BUN: 70 mg/dL — ABNORMAL HIGH (ref 6–23)
BUN: 72 mg/dL — ABNORMAL HIGH (ref 6–23)
CHLORIDE: 97 mmol/L (ref 96–112)
CHLORIDE: 102 mmol/L (ref 96–112)
CO2: 22 mmol/L (ref 19–32)
CO2: 20 mmol/L (ref 19–32)
CO2: 17 mmol/L — AB (ref 19–32)
CREATININE: 5.21 mg/dL — AB (ref 0.50–1.35)
Calcium: 6.2 mg/dL — CL (ref 8.4–10.5)
Calcium: 6.2 mg/dL — CL (ref 8.4–10.5)
Calcium: 6.2 mg/dL — CL (ref 8.4–10.5)
Chloride: 101 mmol/L (ref 96–112)
Creatinine, Ser: 4.73 mg/dL — ABNORMAL HIGH (ref 0.50–1.35)
Creatinine, Ser: 4.81 mg/dL — ABNORMAL HIGH (ref 0.50–1.35)
GFR calc Af Amer: 11 mL/min — ABNORMAL LOW (ref >90)
GFR calc Af Amer: 12 mL/min — ABNORMAL LOW (ref >90)
GFR calc Af Amer: 13 mL/min — ABNORMAL LOW (ref >90)
GFR calc non Af Amer: 11 mL/min — ABNORMAL LOW (ref >90)
GFR calc non Af Amer: 10 mL/min — ABNORMAL LOW (ref >90)
GFR, EST NON AFRICAN AMERICAN: 11 mL/min — AB (ref >90)
GLUCOSE: 90 mg/dL (ref 70–99)
GLUCOSE: 71 mg/dL (ref 70–99)
Glucose, Bld: 117 mg/dL — ABNORMAL HIGH (ref 70–99)
POTASSIUM: 3.6 mmol/L (ref 3.5–5.1)
POTASSIUM: 4.3 mmol/L (ref 3.5–5.1)
Potassium: 3.5 mmol/L (ref 3.5–5.1)
SODIUM: 138 mmol/L (ref 135–145)
SODIUM: 138 mmol/L (ref 135–145)
SODIUM: 139 mmol/L (ref 135–145)

## 2015-02-28 LAB — POCT I-STAT 7, (LYTES, BLD GAS, ICA,H+H)
ACID-BASE DEFICIT: 12 mmol/L — AB (ref 0.0–2.0)
ACID-BASE DEFICIT: 6 mmol/L — AB (ref 0.0–2.0)
BICARBONATE: 16.8 meq/L — AB (ref 20.0–24.0)
Bicarbonate: 20.4 mEq/L (ref 20.0–24.0)
Calcium, Ion: 0.89 mmol/L — ABNORMAL LOW (ref 1.13–1.30)
Calcium, Ion: 0.82 mmol/L — ABNORMAL LOW (ref 1.13–1.30)
HCT: 30 % — ABNORMAL LOW (ref 39.0–52.0)
HEMATOCRIT: 27 % — AB (ref 39.0–52.0)
Hemoglobin: 10.2 g/dL — ABNORMAL LOW (ref 13.0–17.0)
Hemoglobin: 9.2 g/dL — ABNORMAL LOW (ref 13.0–17.0)
O2 SAT: 81 %
O2 SAT: 84 %
PCO2 ART: 48.9 mmHg — AB (ref 35.0–45.0)
PO2 ART: 59 mmHg — AB (ref 80.0–100.0)
PO2 ART: 56 mmHg — AB (ref 80.0–100.0)
Potassium: 3.4 mmol/L — ABNORMAL LOW (ref 3.5–5.1)
Potassium: 3.7 mmol/L (ref 3.5–5.1)
Sodium: 137 mmol/L (ref 135–145)
Sodium: 139 mmol/L (ref 135–145)
TCO2: 18 mmol/L (ref 0–100)
TCO2: 22 mmol/L (ref 0–100)
pCO2 arterial: 45.4 mmHg — ABNORMAL HIGH (ref 35.0–45.0)
pH, Arterial: 7.145 — CL (ref 7.350–7.450)
pH, Arterial: 7.261 — ABNORMAL LOW (ref 7.350–7.450)

## 2015-02-28 LAB — DIC (DISSEMINATED INTRAVASCULAR COAGULATION)PANEL
INR: 1.72 — ABNORMAL HIGH (ref 0.00–1.49)
Prothrombin Time: 20.3 seconds — ABNORMAL HIGH (ref 11.6–15.2)
Smear Review: NONE SEEN
aPTT: 46 seconds — ABNORMAL HIGH (ref 24–37)

## 2015-02-28 LAB — CBC
HCT: 28.8 % — ABNORMAL LOW (ref 39.0–52.0)
HCT: 30.7 % — ABNORMAL LOW (ref 39.0–52.0)
Hemoglobin: 10.3 g/dL — ABNORMAL LOW (ref 13.0–17.0)
Hemoglobin: 10.4 g/dL — ABNORMAL LOW (ref 13.0–17.0)
MCH: 30.8 pg (ref 26.0–34.0)
MCH: 31.4 pg (ref 26.0–34.0)
MCHC: 35.8 g/dL (ref 30.0–36.0)
MCHC: 33.9 g/dL (ref 30.0–36.0)
MCV: 87.8 fL (ref 78.0–100.0)
MCV: 90.8 fL (ref 78.0–100.0)
PLATELETS: 89 10*3/uL — AB (ref 150–400)
PLATELETS: ADEQUATE 10*3/uL (ref 150–400)
RBC: 3.28 MIL/uL — ABNORMAL LOW (ref 4.22–5.81)
RBC: 3.38 MIL/uL — ABNORMAL LOW (ref 4.22–5.81)
RDW: 13.1 % (ref 11.5–15.5)
RDW: 13.3 % (ref 11.5–15.5)
WBC: 5 10*3/uL (ref 4.0–10.5)
WBC: 9.2 10*3/uL (ref 4.0–10.5)

## 2015-02-28 LAB — GLUCOSE, CAPILLARY
GLUCOSE-CAPILLARY: 113 mg/dL — AB (ref 70–99)
GLUCOSE-CAPILLARY: 64 mg/dL — AB (ref 70–99)
GLUCOSE-CAPILLARY: 65 mg/dL — AB (ref 70–99)
GLUCOSE-CAPILLARY: 70 mg/dL (ref 70–99)
Glucose-Capillary: 39 mg/dL — CL (ref 70–99)
Glucose-Capillary: 92 mg/dL (ref 70–99)
Glucose-Capillary: 68 mg/dL — ABNORMAL LOW (ref 70–99)
Glucose-Capillary: 99 mg/dL (ref 70–99)
Glucose-Capillary: 89 mg/dL (ref 70–99)
Glucose-Capillary: 100 mg/dL — ABNORMAL HIGH (ref 70–99)
Glucose-Capillary: 144 mg/dL — ABNORMAL HIGH (ref 70–99)

## 2015-02-28 LAB — BLOOD GAS, ARTERIAL
ACID-BASE DEFICIT: 2.7 mmol/L — AB (ref 0.0–2.0)
Acid-base deficit: 3.7 mmol/L — ABNORMAL HIGH (ref 0.0–2.0)
BICARBONATE: 19.5 meq/L — AB (ref 20.0–24.0)
Bicarbonate: 22.6 mEq/L (ref 20.0–24.0)
Drawn by: 422461
Drawn by: 257701
FIO2: 0.7 %
FIO2: 0.7 %
MECHVT: 600 mL
MECHVT: 450 mL
O2 Saturation: 92.6 %
O2 Saturation: 87.8 %
PEEP/CPAP: 5 cmH2O
PEEP/CPAP: 14 cmH2O
PH ART: 7.465 — AB (ref 7.350–7.450)
PH ART: 7.26 — AB (ref 7.350–7.450)
PO2 ART: 67 mmHg — AB (ref 80.0–100.0)
PO2 ART: 74.1 mmHg — AB (ref 80.0–100.0)
Patient temperature: 37.8
Patient temperature: 38.7
RATE: 35 resp/min
RATE: 35 resp/min
TCO2: 17.8 mmol/L (ref 0–100)
TCO2: 21.4 mmol/L (ref 0–100)
pCO2 arterial: 27.7 mmHg — ABNORMAL LOW (ref 35.0–45.0)
pCO2 arterial: 53.4 mmHg — ABNORMAL HIGH (ref 35.0–45.0)

## 2015-02-28 LAB — POCT I-STAT 4, (NA,K, GLUC, HGB,HCT)
Glucose, Bld: 109 mg/dL — ABNORMAL HIGH (ref 70–99)
HCT: 24 % — ABNORMAL LOW (ref 39.0–52.0)
HEMOGLOBIN: 8.2 g/dL — AB (ref 13.0–17.0)
Potassium: 3.4 mmol/L — ABNORMAL LOW (ref 3.5–5.1)
SODIUM: 142 mmol/L (ref 135–145)

## 2015-02-28 LAB — PROTEIN ELECTROPHORESIS, SERUM
A/G Ratio: 0.7 (ref 0.7–2.0)
Albumin ELP: 2.1 g/dL — ABNORMAL LOW (ref 3.2–5.6)
Alpha-1-Globulin: 0.5 g/dL — ABNORMAL HIGH (ref 0.1–0.4)
Alpha-2-Globulin: 1.1 g/dL (ref 0.4–1.2)
Beta Globulin: 0.7 g/dL (ref 0.6–1.3)
GAMMA GLOBULIN: 0.7 g/dL (ref 0.5–1.6)
Globulin, Total: 3 g/dL (ref 2.0–4.5)
M-Spike, %: 0.1 g/dL — ABNORMAL HIGH
Total Protein ELP: 5.1 g/dL — ABNORMAL LOW (ref 6.0–8.5)

## 2015-02-28 LAB — PREPARE FRESH FROZEN PLASMA
UNIT DIVISION: 0
Unit division: 0
Unit division: 0
Unit division: 0

## 2015-02-28 LAB — UIFE/LIGHT CHAINS/TP QN, 24-HR UR
Albumin, U: DETECTED
Alpha 1, Urine: DETECTED — AB
Alpha 2, Urine: DETECTED — AB
Beta, Urine: DETECTED — AB
Gamma Globulin, Urine: DETECTED — AB
Total Protein, Urine: 176 mg/dL — ABNORMAL HIGH (ref 5–25)

## 2015-02-28 LAB — LACTIC ACID, PLASMA
Lactic Acid, Venous: 8 mmol/L (ref 0.5–2.0)
Lactic Acid, Venous: 8.6 mmol/L (ref 0.5–2.0)

## 2015-02-28 LAB — URINE CULTURE
Colony Count: NO GROWTH
Culture: NO GROWTH
SPECIAL REQUESTS: NORMAL

## 2015-02-28 LAB — MAGNESIUM: Magnesium: 1.7 mg/dL (ref 1.5–2.5)

## 2015-02-28 LAB — PREPARE RBC (CROSSMATCH)

## 2015-02-28 LAB — CORTISOL: Cortisol, Plasma: 110 ug/dL

## 2015-02-28 LAB — PHOSPHORUS: Phosphorus: 4.1 mg/dL (ref 2.3–4.6)

## 2015-02-28 LAB — DIC (DISSEMINATED INTRAVASCULAR COAGULATION) PANEL
D DIMER QUANT: 15.69 ug{FEU}/mL — AB (ref 0.00–0.48)
Platelets: 82 10*3/uL — ABNORMAL LOW (ref 150–400)

## 2015-02-28 LAB — PREPARE PLATELET PHERESIS: Unit division: 0

## 2015-02-28 LAB — PROCALCITONIN: Procalcitonin: 77.8 ng/mL

## 2015-02-28 LAB — HEMOGLOBIN A1C
Hgb A1c MFr Bld: 5.4 % (ref 4.8–5.6)
MEAN PLASMA GLUCOSE: 108 mg/dL

## 2015-02-28 MED ORDER — PROPOFOL 10 MG/ML IV EMUL
5.0000 ug/kg/min | INTRAVENOUS | Status: DC
  Administered 2015-02-28: 5 ug/kg/min via INTRAVENOUS
  Administered 2015-02-28 – 2015-03-01 (×2): 10 ug/kg/min via INTRAVENOUS
  Filled 2015-02-28 (×3): qty 100

## 2015-02-28 MED ORDER — DEXTROSE 50 % IV SOLN
INTRAVENOUS | Status: AC
  Filled 2015-02-28: qty 50

## 2015-02-28 MED ORDER — CETYLPYRIDINIUM CHLORIDE 0.05 % MT LIQD
7.0000 mL | Freq: Four times a day (QID) | OROMUCOSAL | Status: DC
  Administered 2015-02-28 – 2015-03-01 (×5): 7 mL via OROMUCOSAL

## 2015-02-28 MED ORDER — SODIUM CHLORIDE 0.9 % IV SOLN
1000.0000 mg | Freq: Two times a day (BID) | INTRAVENOUS | Status: DC
  Administered 2015-02-28 – 2015-03-01 (×4): 1000 mg via INTRAVENOUS
  Filled 2015-02-28 (×5): qty 10

## 2015-02-28 MED ORDER — CHLORHEXIDINE GLUCONATE 0.12 % MT SOLN
15.0000 mL | Freq: Two times a day (BID) | OROMUCOSAL | Status: DC
  Administered 2015-02-28 – 2015-03-01 (×4): 15 mL via OROMUCOSAL
  Filled 2015-02-28 (×4): qty 15

## 2015-02-28 MED ORDER — DEXTROSE 50 % IV SOLN
25.0000 mL | Freq: Once | INTRAVENOUS | Status: AC
  Administered 2015-02-28: 25 mL via INTRAVENOUS

## 2015-02-28 MED ORDER — SODIUM CHLORIDE 0.9 % IV SOLN
1.0000 g | Freq: Once | INTRAVENOUS | Status: AC
  Administered 2015-02-28: 1 g via INTRAVENOUS
  Filled 2015-02-28: qty 10

## 2015-02-28 MED ORDER — DEXTROSE 50 % IV SOLN
INTRAVENOUS | Status: AC
Start: 2015-02-28 — End: 2015-02-28
  Filled 2015-02-28: qty 50

## 2015-02-28 MED ORDER — SODIUM CHLORIDE 0.9 % IV SOLN
100.0000 mg | Freq: Every day | INTRAVENOUS | Status: DC
  Administered 2015-02-28: 100 mg via INTRAVENOUS
  Filled 2015-02-28 (×2): qty 100

## 2015-02-28 MED ORDER — DEXTROSE 50 % IV SOLN
INTRAVENOUS | Status: AC
  Administered 2015-02-28: 50 mL
  Filled 2015-02-28: qty 50

## 2015-02-28 MED ORDER — CALCIUM GLUCONATE 10 % IV SOLN
1.0000 g | Freq: Once | INTRAVENOUS | Status: AC
  Administered 2015-02-28: 1 g via INTRAVENOUS
  Filled 2015-02-28: qty 10

## 2015-02-28 NOTE — Progress Notes (Signed)
[facility] Progress Note Patient Name: [name] ALEN Sr. DOB: 10-11-1941 MRN: [id number]   Date of Service  02/28/2015  HPI/Events of Note  Facial twitching, jerking, unclear if seizure activity hypoglycemic  eICU Interventions  Propofol gtt, keppra Increase D10 to 75cc/hr     Intervention Category Major Interventions: Seizures - evaluation and management  MCQUAID, DOUGLAS 02/28/2015, 3:34 AM

## 2015-02-28 NOTE — Progress Notes (Signed)
CRITICAL VALUE ALERT  Critical value received:  Ca 6.2  Date of notification: 02/28/2015  Time of notification:  00:40  Critical value read back: yes   Nurse who received alert:  Dyann Ruddle, RN  MD notified (1st page):  CCM,MD  Time of first page:  00:44  MD notified (2nd page):  Time of second page:  Responding MD:  CCM,MD  Time MD responded:  00:44

## 2015-02-28 NOTE — Progress Notes (Signed)
1 Day Post-Op  Subjective: Intubated and critically ill. Sedated. unresponsive  Objective: Vital signs in last 24 hours: Temp:  [89.1 F (31.7 C)-100.9 F (38.3 C)] 100.9 F (38.3 C) (04/06 0800) Pulse Rate:  [25-206] 131 (04/06 0800) Resp:  [10-39] 31 (04/06 0800) BP: (61-195)/(15-112) 97/63 mmHg (04/06 0800) SpO2:  [28 %-100 %] 100 % (04/06 0800) FiO2 (%):  [70 %-100 %] 70 % (04/06 0028) Last BM Date: 02/24/2015  Intake/Output from previous day: 04/05 0701 - 04/06 0700 In: 73710.6 [I.V.:7875.2; Blood:952; IV Piggyback:2810] Out: 2525 [Urine:950; Emesis/NG output:800; Drains:125; Blood:50] Intake/Output this shift: Total I/O In: 466.2 [I.V.:366.2; IV Piggyback:100] Out: 80 [Urine:80]  Resp: clear to auscultation bilaterally and on vent Cardio: tachy, possible afib GI: distended and quiet. drain output serosanguinous  Lab Results:   Recent Labs  03/10/2015 2340 02/28/15 0555  WBC 5.0 9.2  HGB 10.4* 10.3*  HCT 30.7* 28.8*  PLT PLATELET CLUMPS NOTED ON SMEAR, COUNT APPEARS ADEQUATE 89*   BMET  Recent Labs  03/11/2015 2340 02/28/15 0555  NA 139 138  K 3.5 3.6  CL 102 101  CO2 17* 20  GLUCOSE 71 90  BUN 68* 70*  CREATININE 4.81* 4.73*  CALCIUM 6.2* 6.2*   PT/INR  Recent Labs  03/18/2015 0959 02/23/2015 2100  LABPROT 22.8* 19.5*  INR 2.00* 1.64*   ABG  Recent Labs  03/18/2015 1700 02/28/15 0406  PHART 7.311* 7.465*  HCO3 15.1* 19.5*    Studies/Results: Ct Abdomen Pelvis Wo Contrast  02/26/2015   CLINICAL DATA:  Abdominal pain, distention, status post gastric surgery on 02/20/2015, evaluate for perforation  EXAM: CT ABDOMEN AND PELVIS WITHOUT CONTRAST  TECHNIQUE: Multidetector CT imaging of the abdomen and pelvis was performed following the standard protocol without IV contrast.  COMPARISON:  11/13/2014  FINDINGS: Motion degraded images.  Lower chest: Patchy bilateral lower lobe opacities, likely atelectasis. Small bilateral pleural effusions.  Hepatobiliary:  Multiple hepatic cysts, including a 5.3 cm cyst in the posterior segment right hepatic lobe (series 2/ image 15).  Pancreas: Within normal limits.  Spleen: Within normal limits.  Adrenals/Urinary Tract: 2.9 cm left adrenal adenoma.  Right adrenal gland is within normal limits.  4.3 cm right upper pole renal cyst. Left kidney is within normal limits. No hydronephrosis.  Bladder is decompressed by indwelling Foley catheter.  Stomach/Bowel: Postsurgical changes with surgical anastomosis along the lesser curvature of the stomach (series 2/ image 31).  Enteric tube terminates in the posterior gastric cardia (series 2/image 17). However, the mid portion of the enteric tube is likely extraluminal, as the anastomosis along the anterior wall of the stomach is suspected to be widely dehiscent (series 2/image 30).  Associated moderate volume pneumoperitoneum/free air. Associated free extravasation of contrast into the left mid abdomen (series 2/image 20). Moderate volume abdominopelvic ascites.  Vascular/Lymphatic: Mild atherosclerotic calcifications of the abdominal aorta and branch vessels.  No suspicious abdominopelvic lymphadenopathy.  Reproductive: Brachytherapy seeds in the prostate.  Other: Subcutaneous gas with skin staples along the anterior abdominal wall.  Musculoskeletal: Degenerative changes of the visualized thoracolumbar spine.  IMPRESSION: Surgical anastomosis along the anterior wall stomach is suspected to be widely dehiscent.  Associated moderate volume pneumoperitoneum/free air, free extravasation of contrast into the left mid abdomen, and moderate volume abdominopelvic ascites.  Additional ancillary findings above.  Critical Value/emergent results were called by telephone at the time of interpretation on 03/23/2015 at 4:40 pm to Dr. Autumn Messing and at 4:45 pm to Dr. Waldron Labs, who verbally acknowledged these results.  Electronically Signed   By: Julian Hy M.D.   On: 03/03/2015 16:56   US  Renal  03/24/2015   CLINICAL DATA:  Initial evaluation for acute renal failure, history of prostate cancer  EXAM: RENAL/URINARY TRACT ULTRASOUND COMPLETE  COMPARISON:  None.  FINDINGS: Right Kidney:  Length: 10.9 cm. Mildly increased echogenicity. No hydronephrosis. Multiple cysts the largest measuring 4.5 cm.  Left Kidney:  Length: 9.8 cm. Mildly increased echogenicity. No hydronephrosis. Multiple cysts, the largest measuring 2.3 cm.  Bladder:  Not visualized.  Foley catheter is present.  There is a small volume of ascites.  IMPRESSION: Evidence of medical renal disease.  Bilateral renal cysts.   Electronically Signed   By: Skipper Cliche M.D.   On: 03/16/2015 10:43   Nm Pulmonary Perf And Vent  03/19/2015   CLINICAL DATA:  Shortness of breath.  Recent abdominal surgery.  EXAM: NUCLEAR MEDICINE VENTILATION - PERFUSION LUNG SCAN  TECHNIQUE: Ventilation images were obtained in multiple projections using inhaled aerosol technetium 99 M DTPA. Perfusion images were obtained in multiple projections after intravenous injection of Tc-11m MAA.  RADIOPHARMACEUTICALS:  40.0 mCi Tc-34m DTPA aerosol and 5.1 mCi Tc-34m MAA  COMPARISON:  Chest x-ray 03/16/2015  FINDINGS: Ventilation: Diffuse patchy heterogeneous ventilation study with clumping of the radiopharmaceutical.  Perfusion: No definite segmental or subsegmental perfusion defects to suggest pulmonary embolism.  IMPRESSION: Abnormal ventilation scan but no definite perfusion defects to suggest pulmonary embolism.   Electronically Signed   By: Marijo Sanes M.D.   On: 03/03/2015 20:35   Dg Chest Port 1 View  02/28/2015   CLINICAL DATA:  Respiratory failure.  EXAM: PORTABLE CHEST - 1 VIEW  COMPARISON:  03/20/2015 .  FINDINGS: Tracheostomy tube, right IJ line, NG tube in stable position. Heart size stable. Prominent bilateral pulmonary infiltrates with progressive atelectatic change. Small left pleural effusion cannot be excluded. No pneumothorax. Surgical clips in the  neck.  IMPRESSION: 1. Lines and tubes in stable position. 2. Persistent bilateral prominent pulmonary infiltrates with progressive atelectatic changes . Small left pleural effusion cannot be excluded.   Electronically Signed   By: [place]   On: 02/28/2015 07:12   Dg Chest Port 1 View  03/13/2015   CLINICAL DATA:  Status post intubation.  EXAM: PORTABLE CHEST - 1 VIEW  COMPARISON:  Same day.  FINDINGS: Stable cardiomediastinal silhouette. Endotracheal tube is in grossly good position with distal tip 3.6 cm above the carina. Nasogastric tube is seen looped within proximal stomach. No pneumothorax or significant pleural effusion is noted. Right internal jugular catheter is noted with distal tip overlying expected position of the SVC. Mild left basilar opacity is noted concerning for atelectasis or pneumonia. Mild right upper lobe opacity is noted concerning for edema or possibly pneumonia.  IMPRESSION: Endotracheal tube and nasogastric tube in grossly good position. Right internal jugular catheter tip seen in expected position of the SVC without evidence of pneumothorax. Increased right upper lobe opacity is noted concerning for edema or possibly pneumonia. Left basilar opacity is again noted concerning for atelectasis or pneumonia.   Electronically Signed   By: Marijo Conception, M.D.   On: 02/28/2015 12:33   Dg Chest Port 1 View  02/26/2015   CLINICAL DATA:  Shortness of breath and hypertension  EXAM: PORTABLE CHEST - 1 VIEW  COMPARISON:  June 13, 2013  FINDINGS: There is airspace consolidation in the left base. Lungs elsewhere clear. Heart is upper normal in size with pulmonary vascularity within normal  limits. No adenopathy. There is postoperative change in the lower neck region.  IMPRESSION: Left base pneumonia.   Electronically Signed   By: Lowella Grip III M.D.   On: 03/03/2015 15:52    Anti-infectives: Anti-infectives    Start     Dose/Rate Route Frequency Ordered Stop   02/28/15 2200   levofloxacin (LEVAQUIN) IVPB 500 mg  Status:  Discontinued     500 mg 100 mL/hr over 60 Minutes Intravenous Every 48 hours 03/14/2015 2219 02/25/2015 0657   02/28/15 1800  vancomycin (VANCOCIN) IVPB 1000 mg/200 mL premix     1,000 mg 200 mL/hr over 60 Minutes Intravenous Every 48 hours 03/12/2015 1251     02/28/15 0715  micafungin (MYCAMINE) 100 mg in sodium chloride 0.9 % 100 mL IVPB     100 mg 100 mL/hr over 1 Hours Intravenous Daily 02/28/15 0704     03/23/2015 1615  micafungin (MYCAMINE) 100 mg in sodium chloride 0.9 % 100 mL IVPB  Status:  Discontinued     100 mg 100 mL/hr over 1 Hours Intravenous Daily 03/06/2015 1606 03/16/2015 1611   03/14/2015 1600  micafungin (MYCAMINE) 100 mg in sodium chloride 0.9 % 100 mL IVPB     100 mg 100 mL/hr over 1 Hours Intravenous every 24 hours 02/28/2015 1606 03/03/2015 1905   03/04/2015 1400  imipenem-cilastatin (PRIMAXIN) 250 mg in sodium chloride 0.9 % 100 mL IVPB     250 mg 200 mL/hr over 30 Minutes Intravenous Every 12 hours 02/23/2015 1247     03/02/2015 0715  cefTRIAXone (ROCEPHIN) 1 g in dextrose 5 % 50 mL IVPB  Status:  Discontinued     1 g 100 mL/hr over 30 Minutes Intravenous Daily 03/21/2015 0657 03/15/2015 1243   03/16/2015 0715  azithromycin (ZITHROMAX) 500 mg in dextrose 5 % 250 mL IVPB     500 mg 250 mL/hr over 60 Minutes Intravenous Daily 03/04/2015 0657     03/12/2015 2300  levofloxacin (LEVAQUIN) IVPB 750 mg     750 mg 100 mL/hr over 90 Minutes Intravenous  Once 03/11/2015 2219 03/15/2015 0030   02/28/2015 1800  vancomycin (VANCOCIN) IVPB 1000 mg/200 mL premix     1,000 mg 200 mL/hr over 60 Minutes Intravenous STAT 03/10/2015 1749 03/23/2015 2101   03/24/2015 1730  ceFEPIme (MAXIPIME) 2 g in dextrose 5 % 50 mL IVPB     2 g 100 mL/hr over 30 Minutes Intravenous  Once 03/10/2015 1726 02/25/2015 1852      Assessment/Plan: s/p Procedure(s): EXPLORATORY LAPAROTOMY, REPAIR OF GASTRIC STAPLE LINE LEAK (N/A) Still critically ill on 2 pressors  Cr slightly better today VDRF  per CCM EEG today for unresponsive state Continue abx  LOS: 2 days    TOTH III,Kyndahl Jablon S 02/28/2015

## 2015-02-28 NOTE — Progress Notes (Signed)
CRITICAL VALUE ALERT  Critical value received:  Calcium 6.2  Date of notification:  02/28/15  Time of notification: 1430  Critical value read back:yes  Nurse who received alert: Clarene Duke RN  MD notified (1st page): Cherlynn Kaiser NP  Time of first page:  12  MD notified (2nd page):  Time of second page:  Responding MD: Cherlynn Kaiser NP  Time MD responded:  1430

## 2015-02-28 NOTE — Procedures (Signed)
ELECTROENCEPHALOGRAM REPORT  Patient: [name] Wood #: [id number] EEG No. ID: [id number] Age: 74 y.o.        Sex: male Referring Physician: Lamonte Sakai, R Report Date:  02/28/2015        Interpreting Physician: Anthony Sar  History: [name] is an 74 y.o. 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 Patient was also found to have multiple abdominal abscesses which were drained in the operating room. Patient suffered a cardiac arrest on 03/15/2015 with resuscitation for about 20 minutes. He had myoclonic like movements earlier this morning. No frank seizure activity has been reported.  Indications for study: Assess severity of encephalopathy; seizure activity.   Technique: This is an 18 channel routine scalp EEG performed at the bedside with bipolar and monopolar montages arranged in accordance to the international 10/20 system of electrode placement.   Description: Patient was intubated and on mechanical ventilation at the time of this study. He was unresponsive including noxious stimuli. Patient was on sedation with propofol 10 mcg/kg/m. Artifact secondary to periodic head movements was recorded throughout the study, occurring at a frequency of roughly 1-2 Hz and recorded diffusely. Muscle artifact was associated with intermittent head movements. There was no clear cerebral electrical activity recorded during the intervals between muscle contractions, including with increasing EEG sensitivity to 3 V per millimeter of deflection (normal recording sensitivity is 7 V per millimeter).   Interpretation: This EEG is abnormal with findings consistent with severe encephalopathy, most likely secondary to diffuse anoxic brain injury as well as septic shock. Electrocerebral silence is probable, but cannot be determined at this point, as patient is still receiving sedating medication (propofol),  albeit at a very low rate of infusion.   Rush Farmer M.D. [facility] [phone]

## 2015-02-28 NOTE — Progress Notes (Signed)
ANTIBIOTIC CONSULT NOTE - INITIAL  Pharmacy Consult for micafungin Indication: Yeast in blood (1/2)  No Known Allergies  Patient Measurements: Height: 5\' 11"  (180.3 cm) Weight: 192 lb 10.9 oz (87.4 kg) IBW/kg (Calculated) : 75.3 Adjusted Body Weight:   Vital Signs: Temp: 100.6 F (38.1 C) (04/06 0615) Temp Source: Core (Comment) (04/05 2245) BP: 120/74 mmHg (04/06 0215) Pulse Rate: 128 (04/06 0430) Intake/Output from previous day: 04/05 0701 - 04/06 0700 In: 11283.5 [I.V.:7521.5; Blood:952; IV [id number]] Out: 2525 [Urine:950; Emesis/NG output:800; Drains:125; Blood:50] Intake/Output from this shift:    Labs:  Recent Labs  02/28/2015 0112  03/21/2015 0959 03/24/2015 1315 03/16/2015 2340 02/28/15 0555  WBC  --   < > 17.4*  --  5.0 9.2  HGB  --   < > 11.0*  --  10.4* 10.3*  PLT  --   < > 424*  --  PLATELET CLUMPS NOTED ON SMEAR, COUNT APPEARS ADEQUATE 89*  LABCREA 226.9  --   --   --   --   --   CREATININE  --   < >  --  5.50* 4.81* 4.73*  < > = values in this interval not displayed. Estimated Creatinine Clearance: 14.6 mL/min (by C-G formula based on Cr of 4.73). No results for input(s): VANCOTROUGH, VANCOPEAK, VANCORANDOM, GENTTROUGH, GENTPEAK, GENTRANDOM, TOBRATROUGH, TOBRAPEAK, TOBRARND, AMIKACINPEAK, AMIKACINTROU, AMIKACIN in the last 72 hours.   Microbiology: Recent Results (from the past 720 hour(s))  Culture, blood (routine x 2)     Status: None (Preliminary result)   Collection Time: 03/17/2015  5:49 PM  Result Value Ref Range Status   Specimen Description BLOOD RIGHT ANTECUBITAL  Final   Special Requests BOTTLES DRAWN AEROBIC AND ANAEROBIC 5 CC EACH  Final   Culture   Final           BLOOD CULTURE RECEIVED NO GROWTH TO DATE CULTURE WILL BE HELD FOR 5 DAYS BEFORE ISSUING A FINAL NEGATIVE REPORT Performed at [facility]    Report Status PENDING  Incomplete  Culture, blood (routine x 2)     Status: None (Preliminary result)   Collection Time: 02/27/2015   5:49 PM  Result Value Ref Range Status   Specimen Description BLOOD LEFT ANTECUBITAL  Final   Special Requests BOTTLES DRAWN AEROBIC AND ANAEROBIC 5 CC EACH  Final   Culture   Final    YEAST        BLOOD CULTURE RECEIVED NO GROWTH TO DATE CULTURE WILL BE HELD FOR 5 DAYS BEFORE ISSUING A FINAL NEGATIVE REPORT Note: Gram Stain Report Called to,Read Back By and Verified With: Ellsworth Lennox 650AM 02/28/15 [place] Performed at [facility]    Report Status PENDING  Incomplete  MRSA PCR Screening     Status: None   Collection Time: 03/09/2015 11:37 PM  Result Value Ref Range Status   MRSA by PCR NEGATIVE NEGATIVE Final    Comment:        The GeneXpert MRSA Assay (FDA approved for NASAL specimens only), is one component of a comprehensive MRSA colonization surveillance program. It is not intended to diagnose MRSA infection nor to guide or monitor treatment for MRSA infections.     Medical History: Past Medical History  Diagnosis Date  . Allergy     rhinitis  . Hyperlipidemia   . Hypertension   . Osteoarthritis   . Erectile dysfunction   . Overweight(278.02)   . Renal insufficiency     baseline cr 1.5 11-06; cr  1.3 on 5-02  . Prostate cancer 2-11    prostate  . Prostate ca 2013    seeding implant  . 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 Gastrointestinal stromal tumor (GIST)     Medications:  Anti-infectives    Start     Dose/Rate Route Frequency Ordered Stop   02/28/15 2200  levofloxacin (LEVAQUIN) IVPB 500 mg  Status:  Discontinued     500 mg 100 mL/hr over 60 Minutes Intravenous Every 48 hours 03/23/2015 2219 03/15/2015 0657   02/28/15 1800  vancomycin (VANCOCIN) IVPB 1000 mg/200 mL premix     1,000 mg 200 mL/hr over 60 Minutes Intravenous Every 48 hours 03/05/2015 1251     02/28/15 0715  micafungin (MYCAMINE) 100 mg in sodium chloride 0.9 % 100 mL IVPB     100 mg 100 mL/hr over 1 Hours Intravenous Daily 02/28/15 0704     03/16/2015 1615   micafungin (MYCAMINE) 100 mg in sodium chloride 0.9 % 100 mL IVPB  Status:  Discontinued     100 mg 100 mL/hr over 1 Hours Intravenous Daily 03/16/2015 1606 02/23/2015 1611   03/04/2015 1600  micafungin (MYCAMINE) 100 mg in sodium chloride 0.9 % 100 mL IVPB     100 mg 100 mL/hr over 1 Hours Intravenous every 24 hours 03/08/2015 1606 02/26/2015 1905   03/23/2015 1400  imipenem-cilastatin (PRIMAXIN) 250 mg in sodium chloride 0.9 % 100 mL IVPB     250 mg 200 mL/hr over 30 Minutes Intravenous Every 12 hours 03/07/2015 1247     02/23/2015 0715  cefTRIAXone (ROCEPHIN) 1 g in dextrose 5 % 50 mL IVPB  Status:  Discontinued     1 g 100 mL/hr over 30 Minutes Intravenous Daily 02/25/2015 0657 02/23/2015 1243   03/04/2015 0715  azithromycin (ZITHROMAX) 500 mg in dextrose 5 % 250 mL IVPB     500 mg 250 mL/hr over 60 Minutes Intravenous Daily 03/14/2015 0657     02/23/2015 2300  levofloxacin (LEVAQUIN) IVPB 750 mg     750 mg 100 mL/hr over 90 Minutes Intravenous  Once 03/02/2015 2219 03/05/2015 0030   02/23/2015 1800  vancomycin (VANCOCIN) IVPB 1000 mg/200 mL premix     1,000 mg 200 mL/hr over 60 Minutes Intravenous STAT 02/25/2015 1749 03/06/2015 2101   03/16/2015 1730  ceFEPIme (MAXIPIME) 2 g in dextrose 5 % 50 mL IVPB     2 g 100 mL/hr over 30 Minutes Intravenous  Once 03/19/2015 1726 02/28/2015 1852     Assessment: Patient being followed by pharmacy for other antiinfectives.  Yeast in 1/2 blood culture noted.   Will dose micafungin for empiric antifungal therapy, suspected candidiasis.  Goal of Therapy:  Enoxaparin dosed based on patient weight and renal function   Plan:  Follow up culture results  Micafungin 100mg  iv q24hr  Nani Skillern Crowford 02/28/2015,7:04 AM

## 2015-02-28 NOTE — Progress Notes (Signed)
Night chaplain paged at 1852 to support family gathering. Chaplain arrived and found family gathered with their pastor in the ICU family conference room. Family pastor on behalf of the family dismissed the chaplain so that he and the family could talk about the medical options for [name] Wood.   Chaplain visited [name] Wood and found him laboring at his breathing tube. His extremities were cold to the touch, which nurse reports has been the condition for days. A prayer was prayed into [name] Wood's ear.  Please page the chaplain should [name] Wood or his family require or desire spiritual care support.  Sallee Lange. Mylin Gignac, DMin, MDiv Chaplain

## 2015-02-28 NOTE — Progress Notes (Signed)
Lab only able to draw 1 cc of blood for cultures after attempts by 2 lab techs ; Dr. Megan Salon notified. Shalane Florendo, Beverly Gust, RN

## 2015-02-28 NOTE — Progress Notes (Signed)
Pt has had multiple interventions per MD orders, pt remains sedated on vent.

## 2015-02-28 NOTE — Progress Notes (Signed)
INITIAL NUTRITION ASSESSMENT  DOCUMENTATION CODES Per approved criteria  -Not Applicable   INTERVENTION: Recommend trickle feedings of Vital HP @ 10-20 mL/hr via post-pyloric tube.   RD will continue to monitor.   NUTRITION DIAGNOSIS: Inadequate oral intake related to inability to eat as evidenced by NPO.   Goal: Pt to meet >/= 90% of their estimated nutrition needs   Monitor:  Weight trend, NPO, vent status/settings, labs  Reason for Assessment: New Vent  74 y.o. 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 Work up consistent with LLL PNA. On 4/5 the patient was noted to have change in mental status and rising lactate. PCCM consulted. Patient subsequently decompensated with cardiopulmonary arrest requiring ~20 min ACLS with ROSC. CT revealed gastric leak with intra-abd abscesses, to OR 4/5.   4/5 pt s/p: Exploratory laparotomy, drainage of multiple intra-abdominal abscesses, repair of gastric staple line leak.  Patient is currently intubated on ventilator support MV: 15.8 L/min Temp (24hrs), Avg:100 F (37.8 C), Min:96.6 F (35.9 C), Max:101.8 F (38.8 C)  Propofol: 5.2 ml/hr providing additional 137 kcal  No signs of fat or muscle depletion. Per chart history, wt is stable.   Labs and medications reviewed  Height: Ht Readings from Last 1 Encounters:  02/23/2015 5\' 11"  (1.803 m)    Weight: Wt Readings from Last 1 Encounters:  03/15/2015 192 lb 10.9 oz (87.4 kg)    Ideal Body Weight: 75.3 kg  % Ideal Body Weight: 116%  Wt Readings from Last 10 Encounters:  03/21/2015 192 lb 10.9 oz (87.4 kg)  10/18/14 189 lb 2 oz (85.787 kg)  10/12/14 192 lb (87.091 kg)  04/12/14 192 lb (87.091 kg)  04/10/14 193 lb (87.544 kg)  01/13/14 194 lb (87.998 kg)  09/29/13 191 lb (86.637 kg)  08/30/13 192 lb 12.8 oz (87.454 kg)  06/30/13 190 lb (86.183 kg)  06/22/13  192 lb 6.4 oz (87.272 kg)   BMI:  Body mass index is 26.89 kg/(m^2).  Estimated Nutritional Needs: Kcal: 2327 Protein: 120-130 g Fluid: >2.0 L/day  Skin: closed incisions on abdomen  Diet Order: Diet NPO time specified  EDUCATION NEEDS: -Education not appropriate at this time   Intake/Output Summary (Last 24 hours) at 02/28/15 1514 Last data filed at 02/28/15 1400  Gross per 24 hour  Intake 9463.33 ml  Output   2060 ml  Net 7403.33 ml    Last BM: prior to admission   Labs:   Recent Labs Lab 03/02/2015 2200  03/20/2015 1315  03/08/2015 2340 02/28/15 0555 02/28/15 1332  NA  --   < > 141  < > 139 138 138  K  --   < > 4.0  < > 3.5 3.6 4.3  CL  --   < > 105  --  102 101 97  CO2  --   < > 15*  --  17* 20 22  BUN  --   < > 68*  --  68* 70* 72*  CREATININE  --   < > 5.50*  --  4.81* 4.73* 5.21*  CALCIUM  --   < > 7.1*  --  6.2* 6.2* 6.2*  MG 2.2  --  2.7*  --   --  1.7  --   PHOS 5.2*  --   --   --   --  4.1  --   GLUCOSE  --   < >  107*  < > 71 90 117*  < > = values in this interval not displayed.  CBG (last 3)   Recent Labs  02/28/15 0551 02/28/15 0755 02/28/15 1142  GLUCAP 99 89 100*    Scheduled Meds: . sodium chloride   Intravenous Once  . antiseptic oral rinse  7 mL Mouth Rinse QID  . chlorhexidine  15 mL Mouth Rinse BID  . imipenem-cilastatin  250 mg Intravenous Q12H  . levETIRAcetam  1,000 mg Intravenous BID  . levothyroxine  62.5 mcg Intravenous Daily  . micafungin [facility]) IV  100 mg Intravenous Daily  . pantoprazole (PROTONIX) IV  40 mg Intravenous Q12H  . sodium chloride  3 mL Intravenous Q12H  . vancomycin  1,000 mg Intravenous Q48H    Continuous Infusions: . sodium chloride 50 mL/hr (02/28/15 1044)  . dextrose 75 mL/hr at 02/28/15 0653  . norepinephrine (LEVOPHED) Adult infusion 10 mcg/min (02/28/15 1100)  . propofol 15 mcg/kg/min (02/28/15 1200)  .  sodium bicarbonate 150 mEq in sterile water 1000 mL infusion 75 mL/hr at 02/28/15 1316  .  vasopressin (PITRESSIN) infusion - *FOR SHOCK* 0.03 Units/min (02/28/15 1445)    Past Medical History  Diagnosis Date  . Allergy     rhinitis  . Hyperlipidemia   . Hypertension   . Osteoarthritis   . Erectile dysfunction   . Overweight(278.02)   . Renal insufficiency     baseline cr 1.5 11-06; cr 1.3 on 5-02  . Prostate cancer 2-11    prostate  . Prostate ca 2013    seeding implant  . 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 Gastrointestinal stromal tumor (GIST)     Past Surgical History  Procedure Laterality Date  . Tonsillectomy  1967  . Radioactive seed implant  ~2012  . Hernia repair  2009    right  . Thyroidectomy N/A 06/03/2013    Procedure: TOTAL THYROIDECTOMY;  Surgeon: Earnstine Regal, MD;  Location: [facility];  Service: General;  Laterality: N/A;  . Stomach surgery  2016    removal of GIST    Laurette Schimke [place], [place], [place]

## 2015-02-28 NOTE — Progress Notes (Signed)
Pt back from OR

## 2015-02-28 NOTE — Progress Notes (Signed)
[place] Progress Note Patient Name: [name] GRUENHAGEN Sr. DOB: Apr 06, 1941 MRN: [id number]   Date of Service  02/28/2015  HPI/Events of Note  RN notes no significant reaction to pain, has not received sedation HR improved BP improving Lactic acid improving Still hypoglycemic hypocalcemic  eICU Interventions  EEG in AM Replace calcium Give 1/2 AMP D50 and continue D10     Intervention Category Major Interventions: Change in mental status - evaluation and management Intermediate Interventions: Diagnostic test evaluation;Electrolyte abnormality - evaluation and management;Arrhythmia - evaluation and management  MCQUAID, DOUGLAS 02/28/2015, 12:45 AM

## 2015-02-28 NOTE — Consult Note (Signed)
[place] for Infectious Disease    Date of Admission:  03/12/2015           Day 2 vancomycin        Day 2 imipenem        Day 1 micafungin       Reason for Consult: Automatic consultation for fungemia     Principal Problem:   Fungemia Active Problems:   Healthcare-associated pneumonia   Septic shock   Peritonitis   Postoperative intra-abdominal abscess   Gastrointestinal stromal tumor (GIST) of stomach   Hyperlipidemia   Hypertension   ARF (acute renal failure)   Acute respiratory failure   Normocytic anemia   Thrombocytopenia   Hypoglycemia   . sodium chloride   Intravenous Once  . antiseptic oral rinse  7 mL Mouth Rinse QID  . chlorhexidine  15 mL Mouth Rinse BID  . imipenem-cilastatin  250 mg Intravenous Q12H  . levETIRAcetam  1,000 mg Intravenous BID  . levothyroxine  62.5 mcg Intravenous Daily  . micafungin [facility]) IV  100 mg Intravenous Daily  . pantoprazole (PROTONIX) IV  40 mg Intravenous Q12H  . sodium chloride  3 mL Intravenous Q12H  . vancomycin  1,000 mg Intravenous Q48H    Recommendations: 1. Continue current antimicrobial therapy 2. Repeat blood cultures   Assessment: [name] has extensive intra-abdominal infection and fungemia related to his gastric dehiscence. He is also developed HCAP. I agree with current antimicrobial therapy. I will repeat blood cultures now.    HPI: [name] Wood. is a 74 y.o. male who underwent partial gastric resection for a gastrointestinal stromal tumor on 02/19/2015 at [facility]. He was discharged 2 days later but developed severe abdominal pain and was readmitted on 02/22/2015. He suffered a cardiopulmonary arrest on 02/23/2015 but was resuscitated. His cousin, who is present in the room, states that she does not believe he had any fever at that time. She does not think that any abdominal scans were done. He was discharged a second time on 02/25/2015 but began to develop  chills and shortness of breath leading to readmission here on 03/09/2015. He suffered a second, witnessed cardiopulmonary arrest yesterday morning and was resuscitated after 20 minutes. CT scan revealed dehiscence of the gastric staple line and free air and fluid in the abdomen. He was taken to surgery last night for exploratory laparotomy and repair of the leak. Multiple interloop abscesses were drained. He also has pneumonia and both blood cultures from admission are growing yeast.   Review of Systems: Review of systems not obtained due to patient factors.  Past Medical History  Diagnosis Date  . Allergy     rhinitis  . Hyperlipidemia   . Hypertension   . Osteoarthritis   . Erectile dysfunction   . Overweight(278.02)   . Renal insufficiency     baseline cr 1.5 11-06; cr 1.3 on 5-02  . Prostate cancer 2-11    prostate  . Prostate ca 2013    seeding implant  . 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 Gastrointestinal stromal tumor (GIST)     History  Substance Use Topics  . Smoking status: Never Smoker   . Smokeless tobacco: Never Used  . Alcohol Use: No     Comment: very rarely     Family History  Problem Relation Age of Onset  . Alzheimer's disease Mother   . Diabetes Father   .  Kidney disease Father   . Cancer Sister     ? type  . Colon cancer Neg Hx   . Prostate cancer Neg Hx   . CAD Neg Hx    No Known Allergies  OBJECTIVE: Blood pressure 134/58, pulse 94, temperature 101.7 F (38.7 C), temperature source Core (Comment), resp. rate 35, height 5\' 11"  (1.803 m), weight 192 lb 10.9 oz (87.4 kg), SpO2 98 %. General: He is unresponsive on the ventilator. He is on pressors Skin: No rash Lungs: Clear anteriorly Cor: Regular S1 and S2 with no murmur heard Abdomen: Soft and quiet. Drains in place and midline incision covered Joints and extremities: Cool to touch  Lab Results Lab Results  Component Value Date   WBC 9.2 02/28/2015   HGB 10.3*  02/28/2015   HCT 28.8* 02/28/2015   MCV 87.8 02/28/2015   PLT 82* 02/28/2015    Lab Results  Component Value Date   CREATININE 5.21* 02/28/2015   BUN 72* 02/28/2015   NA 138 02/28/2015   K 4.3 02/28/2015   CL 97 02/28/2015   CO2 22 02/28/2015    Lab Results  Component Value Date   ALT 26 03/02/2015   AST 170* 03/03/2015   ALKPHOS 153* 03/11/2015   BILITOT 0.7 03/09/2015     Microbiology: Recent Results (from the past 240 hour(s))  Culture, blood (routine x 2)     Status: None (Preliminary result)   Collection Time: 03/07/2015  5:49 PM  Result Value Ref Range Status   Specimen Description BLOOD RIGHT ANTECUBITAL  Final   Special Requests BOTTLES DRAWN AEROBIC AND ANAEROBIC 5 CC EACH  Final   Culture   Final           BLOOD CULTURE RECEIVED NO GROWTH TO DATE CULTURE WILL BE HELD FOR 5 DAYS BEFORE ISSUING A FINAL NEGATIVE REPORT Performed at [facility]    Report Status PENDING  Incomplete  Culture, blood (routine x 2)     Status: None (Preliminary result)   Collection Time: 02/28/2015  5:49 PM  Result Value Ref Range Status   Specimen Description BLOOD LEFT ANTECUBITAL  Final   Special Requests BOTTLES DRAWN AEROBIC AND ANAEROBIC 5 CC EACH  Final   Culture   Final    YEAST        BLOOD CULTURE RECEIVED NO GROWTH TO DATE CULTURE WILL BE HELD FOR 5 DAYS BEFORE ISSUING A FINAL NEGATIVE REPORT Note: Gram Stain Report Called to,Read Back By and Verified With: Ellsworth Lennox 650AM 02/28/15 [place] Performed at [facility]    Report Status PENDING  Incomplete  MRSA PCR Screening     Status: None   Collection Time: 03/11/2015 11:37 PM  Result Value Ref Range Status   MRSA by PCR NEGATIVE NEGATIVE Final    Comment:        The GeneXpert MRSA Assay (FDA approved for NASAL specimens only), is one component of a comprehensive MRSA colonization surveillance program. It is not intended to diagnose MRSA infection nor to guide or monitor treatment for MRSA  infections.   Culture, respiratory (NON-Expectorated)     Status: None (Preliminary result)   Collection Time: 03/24/2015  2:00 PM  Result Value Ref Range Status   Specimen Description TRACHEAL ASPIRATE  Final   Special Requests Normal  Final   Gram Stain   Final    RARE WBC PRESENT,BOTH PMN AND MONONUCLEAR FEW SQUAMOUS EPITHELIAL CELLS PRESENT ABUNDANT GRAM POSITIVE RODS ABUNDANT YEAST Performed at  [facility]   Final    Culture reincubated for better growth Performed at [place] PENDING  Incomplete    Michel Bickers, [place] [facility] [employer] [phone] pager   [phone] cell 02/28/2015, 2:30 PM

## 2015-02-28 NOTE — Progress Notes (Signed)
EEG completed, results pending. 

## 2015-02-28 NOTE — Progress Notes (Signed)
Pt has Ca level 6.2, lactic 8.0, blood culture positive (anaerobic) for yeast MD made aware, new orders received and initiated.

## 2015-02-28 NOTE — Progress Notes (Signed)
Spiritual care follow up for continued support.    Chaplain provided support with spouse and daughter.  They are grateful that pt survived operation and understand that his stability is still tenuous.  Spoke with chaplain about waiting to find out if Mr Pridgen suffered neurological damage from CPR.   They are hopeful for recovery.  Pt's wife slept in hospital waiting room last night, but is going home today to rest while daughter is at hospital.    [place], [place]

## 2015-02-28 NOTE — Progress Notes (Addendum)
PULMONARY / CRITICAL CARE MEDICINE   Name: [name] FAWAZ Sr. MRN: [id number] DOB: 1941-06-24    ADMISSION DATE:  03/11/2015 CONSULTATION DATE:  March 12, 2015  REFERRING MD :  Dr. 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  Work up consistent with LLL PNA.  On 03-12-2023 the patient was noted to have change in mental status and rising lactate. PCCM consulted. Patient subsequently decompensated with cardiopulmonary arrest requiring ~20 min ACLS with ROSC. CT revealed gastric leak with intra-abd abscesses, to OR 4/5.   STUDIES:  03-12-23  CT ABD/Pelvis >> 03-12-2023  ECHO >> moderate LV concentric hypertrophy, EF 55%,    SIGNIFICANT EVENTS: 2/29  Seen at [facility] by Dr. 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  Presented to [facility] for dyspnea / chills.  Admitted per [facility] Mar 12, 2023  PCCM consulted for rising lactic acid, hypotension  03/12/23  To OR for gastric leak, drainage of intra-abd abscesses  SUBJECTIVE:  Pt unresponsive on low dose propofol (started for myoclonic movements)  norepi weaned Currently on Vt 600cc  VITAL SIGNS: Temp:  [89.1 F (31.7 C)-100.9 F (38.3 C)] 100.9 F (38.3 C) (04/06 0800) Pulse Rate:  [25-206] 131 (04/06 0800) Resp:  [10-39] 31 (04/06 0800) BP: (61-195)/(15-112) 97/63 mmHg (04/06 0800) SpO2:  [28 %-100 %] 100 % (04/06 0800) FiO2 (%):  [70 %-100 %] 70 % (04/06 0028) HEMODYNAMICS: CVP:  [13 mmHg-16 mmHg] 13 mmHg VENTILATOR SETTINGS: Vent Mode:  [-] PRVC FiO2 (%):  [70 %-100 %] 70 % Set Rate:  [24 bmp-35 bmp] 35 bmp Vt Set:  [600 mL] 600 mL PEEP:  [5 cmH20] 5 cmH20 Plateau Pressure:  [25 cmH20-36 cmH20] 25 cmH20   INTAKE / OUTPUT:  Intake/Output Summary (Last 24 hours) at 02/28/15 0920 Last data filed at 02/28/15 0800  Gross  per 24 hour  Intake 10803.39 ml  Output   2605 ml  Net 8198.39 ml    PHYSICAL EXAMINATION: General:  Acutely ill adult male in NAD Neuro:  Obtunded post arrest HEENT:  OETT, mm pale, moist, no jvd  Cardiovascular:  s1s2 irr irr  Lungs:  resp's even/non-labored on MV, lungs coarse bilaterally anterior, diminished lower lateral  Abdomen:  Mild distention, midline abd staples intact, bsx4 hypoactive, coffee ground emesis  Musculoskeletal:  No acute deformities  Skin:  Cool extremites/dry, no edema   LABS:  CBC  Recent Labs Lab March 12, 2015 0959 2015/03/12 2340 02/28/15 0555  WBC 17.4* 5.0 9.2  HGB 11.0* 10.4* 10.3*  HCT 34.8* 30.7* 28.8*  PLT 424* PLATELET CLUMPS NOTED ON SMEAR, COUNT APPEARS ADEQUATE 89*   Coag's  Recent Labs Lab March 12, 2015 0959 03-12-15 2100  INR 2.00* 1.64*     BMET  Recent Labs Lab 03-12-2015 1315 03-12-2015 1924 2015-03-12 2340 02/28/15 0555  NA 141  --  139 138  K 4.0  --  3.5 3.6  CL 105  --  102 101  CO2 15*  --  17* 20  BUN 68*  --  68* 70*  CREATININE 5.50*  --  4.81* 4.73*  GLUCOSE 107* 78 71 90   Electrolytes  Recent Labs Lab 03/10/2015 2200  03-12-2015 1315 03-12-15 2340 02/28/15 0555  CALCIUM  --   < > 7.1* 6.2* 6.2*  MG 2.2  --  2.7*  --  1.7  PHOS 5.2*  --   --   --  4.1  < > = values in this interval not displayed. Sepsis Markers  Recent Labs Lab 03/11/2015 0945 03/15/2015 1315 02/28/2015 2340 02/28/15 0555  LATICACIDVEN 8.5* 10.6* 8.6* 8.0*  PROCALCITON 71.96  --   --  77.80   ABG  Recent Labs Lab 03/19/2015 1540 02/25/2015 1700 02/28/15 0406  PHART 7.128* 7.311* 7.465*  PCO2ART 43.3 30.8* 27.7*  PO2ART 103.0* 91.3 67.0*   Liver Enzymes  Recent Labs Lab 02/28/2015 0345 03/20/2015 1315  AST 47* 170*  ALT 36 26  ALKPHOS 55 153*  BILITOT 1.5* 0.7  ALBUMIN 2.7* 1.7*   Cardiac Enzymes  Recent Labs Lab 03/08/2015 2200 02/23/2015 0345 03/19/2015 0945  TROPONINI 0.04* 0.04* 0.04*   Glucose  Recent Labs Lab  03/18/2015 1828 03/21/2015 1829 03/16/2015 1901 03/12/2015 1942  GLUCAP 19* 15* 39* 48*    Imaging Ct Abdomen Pelvis Wo Contrast  03/05/2015   CLINICAL DATA:  Abdominal pain, distention, status post gastric surgery on 02/20/2015, evaluate for perforation  EXAM: CT ABDOMEN AND PELVIS WITHOUT CONTRAST  TECHNIQUE: Multidetector CT imaging of the abdomen and pelvis was performed following the standard protocol without IV contrast.  COMPARISON:  11/13/2014  FINDINGS: Motion degraded images.  Lower chest: Patchy bilateral lower lobe opacities, likely atelectasis. Small bilateral pleural effusions.  Hepatobiliary: Multiple hepatic cysts, including a 5.3 cm cyst in the posterior segment right hepatic lobe (series 2/ image 15).  Pancreas: Within normal limits.  Spleen: Within normal limits.  Adrenals/Urinary Tract: 2.9 cm left adrenal adenoma.  Right adrenal gland is within normal limits.  4.3 cm right upper pole renal cyst. Left kidney is within normal limits. No hydronephrosis.  Bladder is decompressed by indwelling Foley catheter.  Stomach/Bowel: Postsurgical changes with surgical anastomosis along the lesser curvature of the stomach (series 2/ image 31).  Enteric tube terminates in the posterior gastric cardia (series 2/image 17). However, the mid portion of the enteric tube is likely extraluminal, as the anastomosis along the anterior wall of the stomach is suspected to be widely dehiscent (series 2/image 30).  Associated moderate volume pneumoperitoneum/free air. Associated free extravasation of contrast into the left mid abdomen (series 2/image 20). Moderate volume abdominopelvic ascites.  Vascular/Lymphatic: Mild atherosclerotic calcifications of the abdominal aorta and branch vessels.  No suspicious abdominopelvic lymphadenopathy.  Reproductive: Brachytherapy seeds in the prostate.  Other: Subcutaneous gas with skin staples along the anterior abdominal wall.  Musculoskeletal: Degenerative changes of the visualized  thoracolumbar spine.  IMPRESSION: Surgical anastomosis along the anterior wall stomach is suspected to be widely dehiscent.  Associated moderate volume pneumoperitoneum/free air, free extravasation of contrast into the left mid abdomen, and moderate volume abdominopelvic ascites.  Additional ancillary findings above.  Critical Value/emergent results were called by telephone at the time of interpretation on 03/20/2015 at 4:40 pm to Dr. Autumn Messing and at 4:45 pm to Dr. Waldron Labs, who verbally acknowledged these results.   Electronically Signed   By: Julian Hy M.D.   On: 03/07/2015 16:56   US Renal  03/24/2015   CLINICAL DATA:  Initial evaluation for acute renal failure, history of prostate cancer  EXAM: RENAL/URINARY TRACT ULTRASOUND COMPLETE  COMPARISON:  None.  FINDINGS: Right Kidney:  Length: 10.9 cm. Mildly increased echogenicity. No hydronephrosis. Multiple cysts the largest measuring 4.5 cm.  Left Kidney:  Length: 9.8 cm. Mildly increased echogenicity. No hydronephrosis. Multiple cysts, the largest measuring  2.3 cm.  Bladder:  Not visualized.  Foley catheter is present.  There is a small volume of ascites.  IMPRESSION: Evidence of medical renal disease.  Bilateral renal cysts.   Electronically Signed   By: Skipper Cliche M.D.   On: 03/06/2015 10:43   Dg Chest Port 1 View  03/16/2015   CLINICAL DATA:  Status post intubation.  EXAM: PORTABLE CHEST - 1 VIEW  COMPARISON:  Same day.  FINDINGS: Stable cardiomediastinal silhouette. Endotracheal tube is in grossly good position with distal tip 3.6 cm above the carina. Nasogastric tube is seen looped within proximal stomach. No pneumothorax or significant pleural effusion is noted. Right internal jugular catheter is noted with distal tip overlying expected position of the SVC. Mild left basilar opacity is noted concerning for atelectasis or pneumonia. Mild right upper lobe opacity is noted concerning for edema or possibly pneumonia.  IMPRESSION: Endotracheal  tube and nasogastric tube in grossly good position. Right internal jugular catheter tip seen in expected position of the SVC without evidence of pneumothorax. Increased right upper lobe opacity is noted concerning for edema or possibly pneumonia. Left basilar opacity is again noted concerning for atelectasis or pneumonia.   Electronically Signed   By: Marijo Conception, M.D.   On: 03/09/2015 12:33     ASSESSMENT / PLAN:  PULMONARY OETT 4/5 >>  A: Acute Respiratory Failure / ARDS Bilateral PNA's Presumed Aspiration RUL - during cardiac arrest with vomiting P:   MV support, change to 6cc/kg and ARDS protocol now Follow infiltrates CXR See ID for abx PRN albuterol   CARDIOVASCULAR CVL R IJ TLC 4/5 >>  R femoral art line 4/5 >>  A:  Cardiopulmonary Arrest  Septic shock Atrial Fibrillation > back into NSR am 4/6 Hx HTN, HLD P:  Levophed gtt to maintain MAP > 65 Vasopressin gtt back in NSR > will d/c amiodarone 4/6 and follow, hopefully will not need to restart Hold stress steroids for now (in setting fungemia) but consider add if BP support needs increase Hold home Coreg   RENAL A:   Anion Gap / Lactic Acidosis  Acute Kidney Injury  Hyponatremia  P:   Repeat labs now NaHCO3 gtt @ 112ml/hr >> will decrease to 50 on 4/6 am Trend electrolytes and replace as indicated, monitor K with bicarb gtt Follow lactate for clearance  GASTROINTESTINAL A:   GIST Tumor s/p Resection - resection done at [facility]; c/b leak and intra-abd abscesses Obesity  Vent Associated Dysphagia  P:   Appreciate CCS management NPO until OK with CCS OGT to LIS  PPI   HEMATOLOGIC A:   Mild Anemia - suspect chronic disease related  Thrombocytopenia, suspect due to sepsis vs DIC P:  SCD's for DVT prevention for now DIC panel ordered   INFECTIOUS A:   PNA - LLL noted on admit, RUL airspace disease suspected aspiration during cardiac arrest  Septic Shock, intra-abd source UTI  P:   BCx2 4/4  >> yeast >  UA 4/5 >> positive UTI UC 4/5 >>  Sputum 4/5 >> Flu PCR 4/5 >> negative   Vanco  4/4 >>  Imipenem 4/5 >>  Azithro 4/4 >> 4/6 Mycafungin 4/5 >   ENDOCRINE A:   Hypoglycemia  Hypothyroidism  P:   CBG Q4, will need to confirm CBG on blood draw, ? Finger sticks accurate in this setting Synthroid IV (home dose 125 mcg) Currently on D10, will adjust as able  NEUROLOGIC A:   Post Arrest Encephalopathy -  4/5 Possible hypoxemic (or hypoglycemic) brain injury P:   RASS goal: 0 to -1 Propofol, fentanyl  EEG ordered and pending to eval myoclonus and overall fxn Will check head Ct in next 24-48 h for prognostic information  Involve neuro if no clinical change   FAMILY  - Updates: Family (wife, daughters etc) updated extensively at bedside.  Explained critical nature of illness and that he has suffered a prolonged (20 min) arrest and that if he were to arrest a second time that prolonged ACLS would not be in his best interest.  Full code for now.      Independent critical care time is 50 minutes.   Baltazar Apo, MD, PhD 02/28/2015, 9:20 AM [facility] [phone] or if no answer [phone]

## 2015-02-28 NOTE — Consult Note (Addendum)
Admission H&P    Chief Complaint: Encephalopathy post cardiac arrest.  HPI: [name] PERRIER Sr. is an 74 y.o. 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 Patient was also found to have multiple abdominal abscesses which were drained in the operating room. Patient suffered a cardiac arrest on 03/10/2015, followed by resuscitation for about 20 minutes. He had myoclonic like movements earlier this morning. No frank seizure activity has been reported. An EEG was obtained this afternoon which showed muscle artifact associated with head movements, but likely no remaining cerebral electrical activity. Electro-cerebral silence could not be established, however, as patient was still on low-dose propofol infusion. Patient has suffered septic shock with fungemia. Infectious diseases is involved.  Past Medical History  Diagnosis Date  . Allergy     rhinitis  . Hyperlipidemia   . Hypertension   . Osteoarthritis   . Erectile dysfunction   . Overweight(278.02)   . Renal insufficiency     baseline cr 1.5 11-06; cr 1.3 on 5-02  . Prostate cancer 2-11    prostate  . Prostate ca 2013    seeding implant  . 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 Gastrointestinal stromal tumor (GIST)     Past Surgical History  Procedure Laterality Date  . Tonsillectomy  1967  . Radioactive seed implant  ~2012  . Hernia repair  2009    right  . Thyroidectomy N/A 06/03/2013    Procedure: TOTAL THYROIDECTOMY;  Surgeon: Earnstine Regal, MD;  Location: [facility];  Service: General;  Laterality: N/A;  . Stomach surgery  2016    removal of GIST  . Laparotomy N/A 03/10/2015    Procedure: EXPLORATORY LAPAROTOMY, REPAIR OF GASTRIC STAPLE LINE LEAK;  Surgeon: Jackolyn Confer, MD;  Location: [facility];  Service: General;  Laterality: N/A;    Family History  Problem Relation Age of Onset  . Alzheimer's disease Mother   . Diabetes  Father   . Kidney disease Father   . Cancer Sister     ? type  . Colon cancer Neg Hx   . Prostate cancer Neg Hx   . CAD Neg Hx    Social History:  reports that he has never smoked. He has never used smokeless tobacco. He reports that he does not drink alcohol or use illicit drugs.  Allergies: No Known Allergies  Medications Prior to Admission  Medication Sig Dispense Refill  . acetaminophen (TYLENOL) 500 MG tablet Take 500 mg by mouth every 6 (six) hours as needed for mild pain or moderate pain.    . Calcium Carbonate-Vitamin D (CALCIUM-VITAMIN D) 500-200 MG-UNIT per tablet Take 1 tablet by mouth daily.     . carvedilol (COREG) 25 MG tablet Take 1 tablet (25 mg total) by mouth 2 (two) times daily with a meal. 180 tablet 1  . cholecalciferol (VITAMIN D) 1000 UNITS tablet Take 1,000 Units by mouth daily.    Marland Kitchen levothyroxine (SYNTHROID, LEVOTHROID) 125 MCG tablet Take 1 tablet (125 mcg total) by mouth daily. 45 tablet 3  . mometasone (NASONEX) 50 MCG/ACT nasal spray Place 2 sprays into the nose daily as needed (rhinitis).     . multivitamin (ONE-A-DAY MEN'S) TABS Take 1 tablet by mouth daily.      . Omega-3 1000 MG CAPS Take 2 capsules by mouth daily.    . polyvinyl alcohol (LIQUIFILM TEARS) 1.4 % ophthalmic solution Place 1 drop into both  eyes daily as needed (for allergies).    . simvastatin (ZOCOR) 20 MG tablet Take 1 tablet (20 mg total) by mouth at bedtime. 90 tablet 1  . tamsulosin (FLOMAX) 0.4 MG CAPS capsule TAKE ONE CAPSULE BY MOUTH AT BEDTIME 30 capsule 0  . triamterene-hydrochlorothiazide (MAXZIDE-25) 37.5-25 MG per tablet TAKE ONE TABLET BY MOUTH ONCE DAILY 30 tablet 5    ROS: Source: Chart review. Constitutional: Positive for chills. Negative for fever, weight loss, malaise/fatigue and diaphoresis.  HENT: Negative.  Eyes: Negative.  Respiratory: Positive for shortness of breath. Negative for cough, hemoptysis, sputum production and wheezing.  Cardiovascular: Negative for  chest pain, palpitations, orthopnea, claudication, leg swelling and PND.  Gastrointestinal: Negative.  Genitourinary: Negative.  Musculoskeletal: Negative.  Skin: Negative for itching and rash.  Neurological: Negative. Negative for weakness.  Psychiatric/Behavioral: Negative.  Physical Examination: Blood pressure 95/50, pulse 96, temperature 101.5 F (38.6 C), temperature source Core (Comment), resp. rate 35, height 5' 11"  (1.803 m), weight 87.4 kg (192 lb 10.9 oz), SpO2 100 %.  HEENT-  Normocephalic, no lesions, without obvious abnormality.  Normal external eye and conjunctiva.  Normal TM's bilaterally.  Normal auditory canals and external ears. Normal external nose, mucus membranes and septum.  Normal pharynx. Neck supple with no masses, nodes, nodules or enlargement. Cardiovascular - irregularly irregular rhythm and S1, S2 normal Lungs - on mechanical ventilation with labored effort as well; coarse rales bilaterally.  Abdomen - soft, non-tender; bowel sounds normal; no masses,  no organomegaly Extremities - no joint deformities, effusion, or inflammation  Neurologic Examination: Patient was unresponsive including to external noxious stimuli. Pupils were equal and neither pupil reacted to light. Extraocular movements are absent with oculocephalic maneuvers. Corneal reflexes were absent bilaterally. Face was symmetrical with no focal weakness. Gag reflex was absent. Muscle tone was flaccid throughout. There was no abnormal posturing of extremities. Deep tendon reflexes were 1+ and symmetrical. Plantar responses were mute bilaterally.  Results for orders placed or performed during the hospital encounter of 03/02/2015 (from the past 48 hour(s))  Hemoglobin A1c     Status: None   Collection Time: 03/08/2015 10:00 PM  Result Value Ref Range   Hgb A1c MFr Bld 5.4 4.8 - 5.6 %    Comment: (NOTE)         Pre-diabetes: 5.7 - 6.4         Diabetes: >6.4         Glycemic control for  adults with diabetes: <7.0    Mean Plasma Glucose 108 mg/dL    Comment: (NOTE) Performed At: [facility] [address] [place], [place] [id number] Lindon Romp MD OQ:[id number]   TSH     Status: Abnormal   Collection Time: 03/13/2015 10:00 PM  Result Value Ref Range   TSH 6.370 (H) 0.350 - 4.500 uIU/mL  Troponin I (q 6hr x 3)     Status: Abnormal   Collection Time: 03/15/2015 10:00 PM  Result Value Ref Range   Troponin I 0.04 (H) <0.031 ng/mL    Comment:        PERSISTENTLY INCREASED TROPONIN VALUES IN THE RANGE OF 0.04-0.49 ng/mL CAN BE SEEN IN:       -UNSTABLE ANGINA       -CONGESTIVE HEART FAILURE       -MYOCARDITIS       -CHEST TRAUMA       -ARRYHTHMIAS       -LATE PRESENTING MYOCARDIAL INFARCTION       -COPD  CLINICAL FOLLOW-UP RECOMMENDED.   Protein electrophoresis, serum     Status: Abnormal   Collection Time: 03/11/2015 10:00 PM  Result Value Ref Range   Total Protein ELP 5.1 (L) 6.0 - 8.5 g/dL   Albumin ELP 2.1 (L) 3.2 - 5.6 g/dL   Alpha-1-Globulin 0.5 (H) 0.1 - 0.4 g/dL   Alpha-2-Globulin 1.1 0.4 - 1.2 g/dL   Beta Globulin 0.7 0.6 - 1.3 g/dL   Gamma Globulin 0.7 0.5 - 1.6 g/dL   M-Spike, % 0.1 (H) Not Observed g/dL   SPE Interp. Comment     Comment: (NOTE) Faint band in gamma region suspicious for monoclonal immunoglobulin. Performed At: [facility] [place], [place] [id number] Lindon Romp MD LJ:[id number]    Comment Comment     Comment: (NOTE) Protein electrophoresis scan will follow via computer, mail, or courier delivery.    GLOBULIN, TOTAL 3.0 2.0 - 4.5 g/dL   A/G Ratio 0.7 0.7 - 2.0  Magnesium     Status: None   Collection Time: 03/18/2015 10:00 PM  Result Value Ref Range   Magnesium 2.2 1.5 - 2.5 mg/dL  Phosphorus     Status: Abnormal   Collection Time: 03/05/2015 10:00 PM  Result Value Ref Range   Phosphorus 5.2 (H) 2.3 - 4.6 mg/dL  MRSA PCR Screening     Status: None   Collection Time: 03/04/2015 11:37 PM   Result Value Ref Range   MRSA by PCR NEGATIVE NEGATIVE    Comment:        The GeneXpert MRSA Assay (FDA approved for NASAL specimens only), is one component of a comprehensive MRSA colonization surveillance program. It is not intended to diagnose MRSA infection nor to guide or monitor treatment for MRSA infections.   Sodium, urine, random     Status: None   Collection Time: 03/17/2015  1:12 AM  Result Value Ref Range   Sodium, Ur 18 mEq/L    Comment: Performed at [place] / creatinine ratio, urine     Status: None   Collection Time: 02/28/2015  1:12 AM  Result Value Ref Range   Calcium, Ur <1 mg/dL    Comment: Result repeated and verified.   Creatinine, Urine 226.9 mg/dL    Comment: No reference range established.   Calcium/Creat.Ratio NOT CALCULATED DUE TO CA <1     Comment: Performed at [place] electrophoresis, urine     Status: Abnormal   Collection Time: 03/08/2015  1:12 AM  Result Value Ref Range   Time RANDOM hours    Comment: CORRECTED ON 04/06 AT 1249: PREVIOUSLY REPORTED AS RANDOM, CORRECTED ON 04/05 AT 0118: PREVIOUSLY REPORTED AS 24   Volume, Urine RANDOM mL    Comment: CORRECTED ON 04/06 AT 1249: PREVIOUSLY REPORTED AS 100 CC   Total Protein, Urine 176 (H) 5 - 25 mg/dL    Comment: No established reference range.   Total Protein, Urine-Ur/day NOT CALC <150 mg/day    Comment: (NOTE) Total urinary protein is determined by adding the albumin and Kappa and/or Lambda light chains.  This value may not agree with the total protein as determined by chemical methods, which characteristically underestimate urinary light chains.    Albumin, U DETECTED DETECTED   Alpha 1, Urine DETECTED (A) NONE DETECTED   Alpha 2, Urine DETECTED (A) NONE DETECTED   Beta, Urine DETECTED (A) NONE DETECTED   Gamma Globulin, Urine DETECTED (A) NONE DETECTED   Immunofixation, Urine (NOTE)  Comment: No monoclonal free light chains (Bence Jones  Protein) are detected. Urine IFE shows polyclonal increase in free Kappa and/or free Lambda light chains. Reviewed by Odis Hollingshead, MD, PhD, FCAP (Electronic Signature on File) Performed at [facility]   CBC with Differential/Platelet     Status: Abnormal   Collection Time: 03/19/2015  3:45 AM  Result Value Ref Range   WBC 11.7 (H) 4.0 - 10.5 K/uL   RBC 3.93 (L) 4.22 - 5.81 MIL/uL   Hemoglobin 12.1 (L) 13.0 - 17.0 g/dL   HCT 36.8 (L) 39.0 - 52.0 %   MCV 93.6 78.0 - 100.0 fL   MCH 30.8 26.0 - 34.0 pg   MCHC 32.9 30.0 - 36.0 g/dL   RDW 13.4 11.5 - 15.5 %   Platelets 472 (H) 150 - 400 K/uL   Neutrophils Relative % 46 43 - 77 %   Lymphocytes Relative 6 (L) 12 - 46 %   Monocytes Relative 13 (H) 3 - 12 %   Eosinophils Relative 0 0 - 5 %   Basophils Relative 0 0 - 1 %   Band Neutrophils 31 (H) 0 - 10 %   Metamyelocytes Relative 2 %   Myelocytes 2 %   Promyelocytes Absolute 0 %   Blasts 0 %   nRBC 1 (H) 0 /100 WBC   Neutro Abs 9.5 (H) 1.7 - 7.7 K/uL   Lymphs Abs 0.7 0.7 - 4.0 K/uL   Monocytes Absolute 1.5 (H) 0.1 - 1.0 K/uL   Eosinophils Absolute 0.0 0.0 - 0.7 K/uL   Basophils Absolute 0.0 0.0 - 0.1 K/uL   RBC Morphology POLYCHROMASIA PRESENT     Comment: RARE NRBCs   WBC Morphology INCREASED BANDS (>20% BANDS)     Comment: MILD LEFT SHIFT (1-5% METAS, OCC MYELO, OCC BANDS) TOXIC GRANULATION   Comprehensive metabolic panel     Status: Abnormal   Collection Time: 02/24/2015  3:45 AM  Result Value Ref Range   Sodium 133 (L) 135 - 145 mmol/L   Potassium 4.6 3.5 - 5.1 mmol/L   Chloride 102 96 - 112 mmol/L   CO2 15 (L) 19 - 32 mmol/L   Glucose, Bld 97 70 - 99 mg/dL   BUN 69 (H) 6 - 23 mg/dL   Creatinine, Ser 5.29 (H) 0.50 - 1.35 mg/dL   Calcium 7.2 (L) 8.4 - 10.5 mg/dL   Total Protein 6.2 6.0 - 8.3 g/dL   Albumin 2.7 (L) 3.5 - 5.2 g/dL   AST 47 (H) 0 - 37 U/L   ALT 36 0 - 53 U/L   Alkaline Phosphatase 55 39 - 117 U/L   Total Bilirubin 1.5 (H) 0.3 - 1.2 mg/dL    GFR calc non Af Amer 10 (L) >90 mL/min   GFR calc Af Amer 11 (L) >90 mL/min    Comment: (NOTE) The eGFR has been calculated using the CKD EPI equation. This calculation has not been validated in all clinical situations. eGFR's persistently <90 mL/min signify possible Chronic Kidney Disease.    Anion gap 16 (H) 5 - 15  Troponin I (q 6hr x 3)     Status: Abnormal   Collection Time: 02/24/2015  3:45 AM  Result Value Ref Range   Troponin I 0.04 (H) <0.031 ng/mL    Comment:        PERSISTENTLY INCREASED TROPONIN VALUES IN THE RANGE OF 0.04-0.49 ng/mL CAN BE SEEN IN:       -UNSTABLE ANGINA       -  CONGESTIVE HEART FAILURE       -MYOCARDITIS       -CHEST TRAUMA       -ARRYHTHMIAS       -LATE PRESENTING MYOCARDIAL INFARCTION       -COPD   CLINICAL FOLLOW-UP RECOMMENDED.   Urinalysis, Routine w reflex microscopic     Status: Abnormal   Collection Time: 03/03/2015  8:40 AM  Result Value Ref Range   Color, Urine RED (A) YELLOW    Comment: BIOCHEMICALS MAY BE AFFECTED BY COLOR   APPearance TURBID (A) CLEAR   Specific Gravity, Urine 1.031 (H) 1.005 - 1.030   pH 5.0 5.0 - 8.0   Glucose, UA NEGATIVE NEGATIVE mg/dL   Hgb urine dipstick LARGE (A) NEGATIVE   Bilirubin Urine SMALL (A) NEGATIVE   Ketones, ur 15 (A) NEGATIVE mg/dL   Protein, ur 100 (A) NEGATIVE mg/dL   Urobilinogen, UA 1.0 0.0 - 1.0 mg/dL   Nitrite POSITIVE (A) NEGATIVE   Leukocytes, UA SMALL (A) NEGATIVE  Urine microscopic-add on     Status: Abnormal   Collection Time: 03/07/2015  8:40 AM  Result Value Ref Range   WBC, UA 7-10 <3 WBC/hpf   RBC / HPF 3-6 <3 RBC/hpf   Bacteria, UA MANY (A) RARE   Urine-Other AMORPHOUS URATES/PHOSPHATES     Comment: SPERM PRESENT  Troponin I (q 6hr x 3)     Status: Abnormal   Collection Time: 03/02/2015  9:45 AM  Result Value Ref Range   Troponin I 0.04 (H) <0.031 ng/mL    Comment:        PERSISTENTLY INCREASED TROPONIN VALUES IN THE RANGE OF 0.04-0.49 ng/mL CAN BE SEEN IN:       -UNSTABLE  ANGINA       -CONGESTIVE HEART FAILURE       -MYOCARDITIS       -CHEST TRAUMA       -ARRYHTHMIAS       -LATE PRESENTING MYOCARDIAL INFARCTION       -COPD   CLINICAL FOLLOW-UP RECOMMENDED.   Lactic acid, plasma     Status: Abnormal   Collection Time: 03/22/2015  9:45 AM  Result Value Ref Range   Lactic Acid, Venous 8.5 (HH) 0.5 - 2.0 mmol/L    Comment: REPEATED TO VERIFY CRITICAL RESULT CALLED TO, READ BACK BY AND VERIFIED WITH: SEEL,S. RN AT 1102 03/24/2015 MULLINS,T   Procalcitonin - Baseline     Status: None   Collection Time: 03/21/2015  9:45 AM  Result Value Ref Range   Procalcitonin 71.96 ng/mL    Comment:        Interpretation: PCT >= 10 ng/mL: Important systemic inflammatory response, almost exclusively due to severe bacterial sepsis or septic shock. (NOTE)         ICU PCT Algorithm               Non ICU PCT Algorithm    ----------------------------     ------------------------------         PCT < 0.25 ng/mL                 PCT < 0.1 ng/mL     Stopping of antibiotics            Stopping of antibiotics       strongly encouraged.               strongly encouraged.    ----------------------------     ------------------------------       PCT  level decrease by               PCT < 0.25 ng/mL       >= 80% from peak PCT       OR PCT 0.25 - 0.5 ng/mL          Stopping of antibiotics                                             encouraged.     Stopping of antibiotics           encouraged.    ----------------------------     ------------------------------       PCT level decrease by              PCT >= 0.25 ng/mL       < 80% from peak PCT        AND PCT >= 0.5 ng/mL             Continuing antibiotics                                              encouraged.       Continuing antibiotics            encouraged.    ----------------------------     ------------------------------     PCT level increase compared          PCT > 0.5 ng/mL         with peak PCT AND          PCT >= 0.5  ng/mL             Escalation of antibiotics                                          strongly encouraged.      Escalation of antibiotics        strongly encouraged.   CBC     Status: Abnormal   Collection Time: 03/08/2015  9:59 AM  Result Value Ref Range   WBC 17.4 (H) 4.0 - 10.5 K/uL   RBC 3.55 (L) 4.22 - 5.81 MIL/uL   Hemoglobin 11.0 (L) 13.0 - 17.0 g/dL   HCT 34.8 (L) 39.0 - 52.0 %   MCV 98.0 78.0 - 100.0 fL   MCH 31.0 26.0 - 34.0 pg   MCHC 31.6 30.0 - 36.0 g/dL   RDW 13.8 11.5 - 15.5 %   Platelets 424 (H) 150 - 400 K/uL  Protime-INR     Status: Abnormal   Collection Time: 02/24/2015  9:59 AM  Result Value Ref Range   Prothrombin Time 22.8 (H) 11.6 - 15.2 seconds   INR 2.00 (H) 0.00 - 1.49  TSH     Status: None   Collection Time: 03/21/2015 12:27 PM  Result Value Ref Range   TSH 4.199 0.350 - 4.500 uIU/mL  Lactic acid, plasma     Status: Abnormal   Collection Time: 03/22/2015  1:15 PM  Result Value Ref Range   Lactic Acid, Venous 10.6 (HH) 0.5 - 2.0 mmol/L    Comment: REPEATED TO VERIFY CRITICAL RESULT CALLED TO,  READ BACK BY AND VERIFIED WITH: SEEL,S @ 1443 ON 03/22/2015 BY POTEAT,S   Basic metabolic panel     Status: Abnormal   Collection Time: 03/02/2015  1:15 PM  Result Value Ref Range   Sodium 141 135 - 145 mmol/L    Comment: REPEATED TO VERIFY DELTA CHECK NOTED    Potassium 4.0 3.5 - 5.1 mmol/L   Chloride 105 96 - 112 mmol/L   CO2 15 (L) 19 - 32 mmol/L   Glucose, Bld 107 (H) 70 - 99 mg/dL   BUN 68 (H) 6 - 23 mg/dL   Creatinine, Ser 5.50 (H) 0.50 - 1.35 mg/dL   Calcium 7.1 (L) 8.4 - 10.5 mg/dL   GFR calc non Af Amer 9 (L) >90 mL/min   GFR calc Af Amer 11 (L) >90 mL/min    Comment: (NOTE) The eGFR has been calculated using the CKD EPI equation. This calculation has not been validated in all clinical situations. eGFR's persistently <90 mL/min signify possible Chronic Kidney Disease.    Anion gap 21 (H) 5 - 15  Magnesium     Status: Abnormal   Collection Time:  03/18/2015  1:15 PM  Result Value Ref Range   Magnesium 2.7 (H) 1.5 - 2.5 mg/dL  Hepatic function panel     Status: Abnormal   Collection Time: 03/03/2015  1:15 PM  Result Value Ref Range   Total Protein 4.4 (L) 6.0 - 8.3 g/dL   Albumin 1.7 (L) 3.5 - 5.2 g/dL   AST 170 (H) 0 - 37 U/L   ALT 26 0 - 53 U/L    Comment: RESULTS CONFIRMED BY MANUAL DILUTION   Alkaline Phosphatase 153 (H) 39 - 117 U/L   Total Bilirubin 0.7 0.3 - 1.2 mg/dL   Bilirubin, Direct 0.4 0.0 - 0.5 mg/dL   Indirect Bilirubin 0.3 0.3 - 0.9 mg/dL  Blood gas, arterial     Status: Abnormal   Collection Time: 03/12/2015  1:30 PM  Result Value Ref Range   FIO2 1.00 %   Delivery systems VENTILATOR    Mode PRESSURE REGULATED VOLUME CONTROL    VT 600 mL   Rate 24 resp/min   Peep/cpap 5.0 cm H20   pH, Arterial 7.056 (LL) 7.350 - 7.450    Comment: RBV SUSAN SEEL,RN AT 13:35 BY MJACKSON,RRT,RCP ON 4 CRITICAL RESULT CALLED TO, READ BACK BY AND VERIFIED WITH: S.SEEL,RN AT 13:35 BY MJACKSON,RRT,RCP ON 03/24/2015    pCO2 arterial 47.9 (H) 35.0 - 45.0 mmHg   pO2, Arterial 149.0 (H) 80.0 - 100.0 mmHg   Bicarbonate 12.8 (L) 20.0 - 24.0 mEq/L   TCO2 13.2 0 - 100 mmol/L   Acid-base deficit 16.4 (H) 0.0 - 2.0 mmol/L   O2 Saturation 97.1 %   Patient temperature 98.6    Collection site A-LINE    Drawn by [id number]    Sample type ARTERIAL DRAW    Allens test (pass/fail) PASS PASS  Culture, Urine     Status: None   Collection Time: 03/12/2015  1:36 PM  Result Value Ref Range   Specimen Description URINE, CATHETERIZED    Special Requests Normal    Colony Count NO GROWTH Performed at [facility]     Culture NO GROWTH Performed at [facility]     Report Status 02/28/2015 FINAL   Culture, respiratory (NON-Expectorated)     Status: None (Preliminary result)   Collection Time: 03/14/2015  2:00 PM  Result Value Ref Range   Specimen Description TRACHEAL  ASPIRATE    Special Requests Normal    Gram Stain      RARE WBC  PRESENT,BOTH PMN AND MONONUCLEAR FEW SQUAMOUS EPITHELIAL CELLS PRESENT ABUNDANT GRAM POSITIVE RODS ABUNDANT YEAST Performed at [facility]    Culture      Culture reincubated for better growth Performed at [facility]    Report Status PENDING   Influenza panel by PCR (type A & B, H1N1)     Status: None   Collection Time: 03/22/2015  2:23 PM  Result Value Ref Range   Influenza A By PCR NEGATIVE NEGATIVE   Influenza B By PCR NEGATIVE NEGATIVE   H1N1 flu by pcr NOT DETECTED NOT DETECTED    Comment:        The Xpert Flu assay (FDA approved for nasal aspirates or washes and nasopharyngeal swab specimens), is intended as an aid in the diagnosis of influenza and should not be used as a sole basis for treatment. Performed at [facility]   Blood gas, arterial     Status: Abnormal   Collection Time: 02/25/2015  3:40 PM  Result Value Ref Range   FIO2 1.00 %   Delivery systems VENTILATOR    Mode PRESSURE REGULATED VOLUME CONTROL    VT 600 mL   Rate 24 resp/min   Peep/cpap 5.0 cm H20   pH, Arterial 7.128 (LL) 7.350 - 7.450    Comment: RBV S.SEEL,RN AT 1547 BY M.JACKSON,RRT,RCP ON 4 CRITICAL RESULT CALLED TO, READ BACK BY AND VERIFIED WITH: S.SEEL,RN AT 1547 BY M.JACKSON,RRT,RCP ON 03/15/2015    pCO2 arterial 43.3 35.0 - 45.0 mmHg   pO2, Arterial 103.0 (H) 80.0 - 100.0 mmHg   Bicarbonate 13.7 (L) 20.0 - 24.0 mEq/L   TCO2 13.5 0 - 100 mmol/L   Acid-base deficit 14.6 (H) 0.0 - 2.0 mmol/L   O2 Saturation 95.0 %   Patient temperature 98.6    Collection site A-LINE    Drawn by [id number]    Sample type ARTERIAL DRAW    Allens test (pass/fail) PASS PASS  Blood gas, arterial     Status: Abnormal   Collection Time: 03/17/2015  5:00 PM  Result Value Ref Range   FIO2 1.00 %   Delivery systems VENTILATOR    Mode PRESSURE REGULATED VOLUME CONTROL    VT 600 mL   Rate 35 resp/min   Peep/cpap 5.0 cm H20   pH, Arterial 7.311 (L) 7.350 - 7.450   pCO2 arterial 30.8 (L) 35.0  - 45.0 mmHg   pO2, Arterial 91.3 80.0 - 100.0 mmHg   Bicarbonate 15.1 (L) 20.0 - 24.0 mEq/L   TCO2 14.2 0 - 100 mmol/L   Acid-base deficit 9.7 (H) 0.0 - 2.0 mmol/L   O2 Saturation 96.2 %   Patient temperature 98.6    Collection site A-LINE    Drawn by [id number]    Sample type ARTERIAL DRAW    Allens test (pass/fail) PASS PASS  Carboxyhemoglobin     Status: Abnormal   Collection Time: 03/17/2015  5:23 PM  Result Value Ref Range   Total hemoglobin 10.8 (L) 13.5 - 18.0 g/dL   O2 Saturation 50.8 %   Carboxyhemoglobin 0.8 0.5 - 1.5 %   Methemoglobin 0.9 0.0 - 1.5 %  Cortisol     Status: None   Collection Time: 03/06/2015  5:42 PM  Result Value Ref Range   Cortisol, Plasma 110.0 ug/dL    Comment: (NOTE) Result repeated and verified. Result confirmed by automatic  dilution. AM:  4.3 - 22.4 ug/dL PM:  3.1 - 16.7 ug/dL Performed at [facility]   Fibrinogen     Status: Abnormal   Collection Time: 02/24/2015  5:43 PM  Result Value Ref Range   Fibrinogen >800 (H) 204 - 475 mg/dL    Comment: REPEATED TO VERIFY  Lactate dehydrogenase     Status: Abnormal   Collection Time: 03/14/2015  5:43 PM  Result Value Ref Range   LDH 668 (H) 94 - 250 U/L  Prepare fresh frozen plasma     Status: None   Collection Time: 03/20/2015  5:50 PM  Result Value Ref Range   Unit Number [id number]    Blood Component Type THAWED PLASMA    Unit division 00    Status of Unit ISSUED,FINAL    Transfusion Status OK TO TRANSFUSE    Unit Number [id number]    Blood Component Type THAWED PLASMA    Unit division 00    Status of Unit ISSUED,FINAL    Transfusion Status OK TO TRANSFUSE    Unit Number [id number]    Blood Component Type THAWED PLASMA    Unit division 00    Status of Unit REL FROM [facility]    Transfusion Status OK TO TRANSFUSE    Unit Number [id number]    Blood Component Type THAWED PLASMA    Unit division 00    Status of Unit REL FROM [facility]    Transfusion Status OK TO TRANSFUSE   Glucose,  capillary     Status: Abnormal   Collection Time: 03/23/2015  6:28 PM  Result Value Ref Range   Glucose-Capillary 19 (LL) 70 - 99 mg/dL   Comment 1 Repeat Test    Comment 2 Procedure Error   Glucose, capillary     Status: Abnormal   Collection Time: 03/18/2015  6:29 PM  Result Value Ref Range   Glucose-Capillary 15 (LL) 70 - 99 mg/dL   Comment 1 Notify RN    Comment 2 Document in Chart   Type and screen     Status: None (Preliminary result)   Collection Time: 02/23/2015  6:37 PM  Result Value Ref Range   ABO/RH(D) AB POS    Antibody Screen NEG    Sample Expiration 03/02/2015    Unit Number [id number]    Blood Component Type RED CELLS,LR    Unit division 00    Status of Unit REL FROM [facility]    Transfusion Status OK TO TRANSFUSE    Crossmatch Result Compatible    Unit Number [id number]    Blood Component Type RED CELLS,LR    Unit division 00    Status of Unit ISSUED,FINAL    Transfusion Status OK TO TRANSFUSE    Crossmatch Result Compatible    Unit Number [id number]    Blood Component Type RED CELLS,LR    Unit division 00    Status of Unit ALLOCATED    Transfusion Status OK TO TRANSFUSE    Crossmatch Result Compatible    Unit Number [id number]    Blood Component Type RED CELLS,LR    Unit division 00    Status of Unit ALLOCATED    Transfusion Status OK TO TRANSFUSE    Crossmatch Result Compatible   Glucose, capillary     Status: Abnormal   Collection Time: 03/05/2015  7:01 PM  Result Value Ref Range   Glucose-Capillary 39 (LL) 70 - 99 mg/dL   Comment 1 Notify RN    Comment 2 Document in  Chart   Glucose, random     Status: None   Collection Time: 03/08/2015  7:24 PM  Result Value Ref Range   Glucose, Bld 78 70 - 99 mg/dL  Prepare RBC (crossmatch)     Status: None   Collection Time: 03/02/2015  7:37 PM  Result Value Ref Range   Order Confirmation ORDER PROCESSED BY BLOOD BANK   ABO/Rh     Status: None   Collection Time: 02/28/2015  7:38 PM  Result Value Ref Range    ABO/RH(D) AB POS   Glucose, capillary     Status: Abnormal   Collection Time: 03/14/2015  7:42 PM  Result Value Ref Range   Glucose-Capillary 48 (L) 70 - 99 mg/dL   Comment 1 Notify RN    Comment 2 Document in Chart   Prepare platelet pheresis     Status: None   Collection Time: 03/24/2015  8:07 PM  Result Value Ref Range   Unit Number [id number]    Blood Component Type PLTP LR1 PAS    Unit division 00    Status of Unit REL FROM [facility]    Transfusion Status OK TO TRANSFUSE   I-STAT 7, (LYTES, BLD GAS, ICA, H+H)     Status: Abnormal   Collection Time: 03/13/2015  8:48 PM  Result Value Ref Range   pH, Arterial 7.145 (LL) 7.350 - 7.450   pCO2 arterial 48.9 (H) 35.0 - 45.0 mmHg   pO2, Arterial 59.0 (L) 80.0 - 100.0 mmHg   Bicarbonate 16.8 (L) 20.0 - 24.0 mEq/L   TCO2 18 0 - 100 mmol/L   O2 Saturation 81.0 %   Acid-base deficit 12.0 (H) 0.0 - 2.0 mmol/L   Sodium 137 135 - 145 mmol/L   Potassium 3.4 (L) 3.5 - 5.1 mmol/L   Calcium, Ion 0.89 (L) 1.13 - 1.30 mmol/L   HCT 30.0 (L) 39.0 - 52.0 %   Hemoglobin 10.2 (L) 13.0 - 17.0 g/dL   Sample type ARTERIAL    Comment NOTIFIED PHYSICIAN   Protime-INR     Status: Abnormal   Collection Time: 03/24/2015  9:00 PM  Result Value Ref Range   Prothrombin Time 19.5 (H) 11.6 - 15.2 seconds   INR 1.64 (H) 0.00 - 1.49  I-STAT 4, (NA,K, GLUC, HGB,HCT)     Status: Abnormal   Collection Time: 02/25/2015  9:09 PM  Result Value Ref Range   Sodium 142 135 - 145 mmol/L   Potassium 3.4 (L) 3.5 - 5.1 mmol/L   Glucose, Bld 109 (H) 70 - 99 mg/dL   HCT 24.0 (L) 39.0 - 52.0 %   Hemoglobin 8.2 (L) 13.0 - 17.0 g/dL  I-STAT 7, (LYTES, BLD GAS, ICA, H+H)     Status: Abnormal   Collection Time: 03/09/2015  9:36 PM  Result Value Ref Range   pH, Arterial 7.261 (L) 7.350 - 7.450   pCO2 arterial 45.4 (H) 35.0 - 45.0 mmHg   pO2, Arterial 56.0 (L) 80.0 - 100.0 mmHg   Bicarbonate 20.4 20.0 - 24.0 mEq/L   TCO2 22 0 - 100 mmol/L   O2 Saturation 84.0 %   Acid-base deficit  6.0 (H) 0.0 - 2.0 mmol/L   Sodium 139 135 - 145 mmol/L   Potassium 3.7 3.5 - 5.1 mmol/L   Calcium, Ion 0.82 (L) 1.13 - 1.30 mmol/L   HCT 27.0 (L) 39.0 - 52.0 %   Hemoglobin 9.2 (L) 13.0 - 17.0 g/dL   Sample type ARTERIAL   Glucose, capillary  Status: None   Collection Time: 03/18/2015 10:56 PM  Result Value Ref Range   Glucose-Capillary 70 70 - 99 mg/dL  CBC     Status: Abnormal   Collection Time: 03/06/2015 11:40 PM  Result Value Ref Range   WBC 5.0 4.0 - 10.5 K/uL    Comment: ADJUSTED FOR NUCLEATED RBC'S   RBC 3.38 (L) 4.22 - 5.81 MIL/uL   Hemoglobin 10.4 (L) 13.0 - 17.0 g/dL   HCT 30.7 (L) 39.0 - 52.0 %   MCV 90.8 78.0 - 100.0 fL    Comment: DELTA CHECK NOTED POST TRANSFUSION SPECIMEN    MCH 30.8 26.0 - 34.0 pg   MCHC 33.9 30.0 - 36.0 g/dL   RDW 13.3 11.5 - 15.5 %   Platelets  150 - 400 K/uL    PLATELET CLUMPS NOTED ON SMEAR, COUNT APPEARS ADEQUATE    Comment: LARGE PLATELETS PRESENT PLATELET COUNT CONFIRMED BY SMEAR   Basic metabolic panel     Status: Abnormal   Collection Time: 03/04/2015 11:40 PM  Result Value Ref Range   Sodium 139 135 - 145 mmol/L   Potassium 3.5 3.5 - 5.1 mmol/L   Chloride 102 96 - 112 mmol/L   CO2 17 (L) 19 - 32 mmol/L   Glucose, Bld 71 70 - 99 mg/dL   BUN 68 (H) 6 - 23 mg/dL   Creatinine, Ser 4.81 (H) 0.50 - 1.35 mg/dL   Calcium 6.2 (LL) 8.4 - 10.5 mg/dL    Comment: CRITICAL RESULT CALLED TO, READ BACK BY AND VERIFIED WITH: Vira Blanco RN 0025 02/28/15 A NAVARRO    GFR calc non Af Amer 11 (L) >90 mL/min   GFR calc Af Amer 12 (L) >90 mL/min    Comment: (NOTE) The eGFR has been calculated using the CKD EPI equation. This calculation has not been validated in all clinical situations. eGFR's persistently <90 mL/min signify possible Chronic Kidney Disease.    Anion gap 20 (H) 5 - 15  Lactic acid, plasma     Status: Abnormal   Collection Time: 03/10/2015 11:40 PM  Result Value Ref Range   Lactic Acid, Venous 8.6 (HH) 0.5 - 2.0 mmol/L     Comment: CRITICAL RESULT CALLED TO, READ BACK BY AND VERIFIED WITHOrlin Hilding RN [phone] 02/28/15 A NAVARRO   Glucose, capillary     Status: Abnormal   Collection Time: 02/28/15 12:28 AM  Result Value Ref Range   Glucose-Capillary 64 (L) 70 - 99 mg/dL  Glucose, capillary     Status: None   Collection Time: 02/28/15  1:26 AM  Result Value Ref Range   Glucose-Capillary 92 70 - 99 mg/dL  Glucose, capillary     Status: Abnormal   Collection Time: 02/28/15  3:25 AM  Result Value Ref Range   Glucose-Capillary 68 (L) 70 - 99 mg/dL  Blood gas, arterial     Status: Abnormal   Collection Time: 02/28/15  4:06 AM  Result Value Ref Range   FIO2 0.70 %   Delivery systems VENTILATOR    Mode PRESSURE REGULATED VOLUME CONTROL    VT 600 mL   Rate 35 resp/min   Peep/cpap 5.0 cm H20   pH, Arterial 7.465 (H) 7.350 - 7.450   pCO2 arterial 27.7 (L) 35.0 - 45.0 mmHg   pO2, Arterial 67.0 (L) 80.0 - 100.0 mmHg   Bicarbonate 19.5 (L) 20.0 - 24.0 mEq/L   TCO2 17.8 0 - 100 mmol/L   Acid-base deficit 2.7 (H) 0.0 - 2.0 mmol/L  O2 Saturation 92.6 %   Patient temperature 37.8    Collection site ARTERIAL LINE    Drawn by [id number]    Sample type ARTERIAL DRAW   Glucose, capillary     Status: None   Collection Time: 02/28/15  5:51 AM  Result Value Ref Range   Glucose-Capillary 99 70 - 99 mg/dL  Procalcitonin     Status: None   Collection Time: 02/28/15  5:55 AM  Result Value Ref Range   Procalcitonin 77.80 ng/mL    Comment:        Interpretation: PCT >= 10 ng/mL: Important systemic inflammatory response, almost exclusively due to severe bacterial sepsis or septic shock. (NOTE)         ICU PCT Algorithm               Non ICU PCT Algorithm    ----------------------------     ------------------------------         PCT < 0.25 ng/mL                 PCT < 0.1 ng/mL     Stopping of antibiotics            Stopping of antibiotics       strongly encouraged.               strongly encouraged.     ----------------------------     ------------------------------       PCT level decrease by               PCT < 0.25 ng/mL       >= 80% from peak PCT       OR PCT 0.25 - 0.5 ng/mL          Stopping of antibiotics                                             encouraged.     Stopping of antibiotics           encouraged.    ----------------------------     ------------------------------       PCT level decrease by              PCT >= 0.25 ng/mL       < 80% from peak PCT        AND PCT >= 0.5 ng/mL             Continuing antibiotics                                              encouraged.       Continuing antibiotics            encouraged.    ----------------------------     ------------------------------     PCT level increase compared          PCT > 0.5 ng/mL         with peak PCT AND          PCT >= 0.5 ng/mL             Escalation of antibiotics  strongly encouraged.      Escalation of antibiotics        strongly encouraged.   Basic metabolic panel     Status: Abnormal   Collection Time: 02/28/15  5:55 AM  Result Value Ref Range   Sodium 138 135 - 145 mmol/L   Potassium 3.6 3.5 - 5.1 mmol/L   Chloride 101 96 - 112 mmol/L   CO2 20 19 - 32 mmol/L   Glucose, Bld 90 70 - 99 mg/dL   BUN 70 (H) 6 - 23 mg/dL   Creatinine, Ser 4.73 (H) 0.50 - 1.35 mg/dL   Calcium 6.2 (LL) 8.4 - 10.5 mg/dL    Comment: REPEATED TO VERIFY CRITICAL RESULT CALLED TO, READ BACK BY AND VERIFIED WITH: GARLAND,G/2W @0635  ON 02/28/15 BY KARCZEWSKI,S.    GFR calc non Af Amer 11 (L) >90 mL/min   GFR calc Af Amer 13 (L) >90 mL/min    Comment: (NOTE) The eGFR has been calculated using the CKD EPI equation. This calculation has not been validated in all clinical situations. eGFR's persistently <90 mL/min signify possible Chronic Kidney Disease.    Anion gap 17 (H) 5 - 15  Magnesium     Status: None   Collection Time: 02/28/15  5:55 AM  Result Value Ref Range    Magnesium 1.7 1.5 - 2.5 mg/dL  Phosphorus     Status: None   Collection Time: 02/28/15  5:55 AM  Result Value Ref Range   Phosphorus 4.1 2.3 - 4.6 mg/dL  CBC     Status: Abnormal   Collection Time: 02/28/15  5:55 AM  Result Value Ref Range   WBC 9.2 4.0 - 10.5 K/uL    Comment: ADJUSTED FOR NUCLEATED RBC'S   RBC 3.28 (L) 4.22 - 5.81 MIL/uL   Hemoglobin 10.3 (L) 13.0 - 17.0 g/dL   HCT 28.8 (L) 39.0 - 52.0 %   MCV 87.8 78.0 - 100.0 fL   MCH 31.4 26.0 - 34.0 pg   MCHC 35.8 30.0 - 36.0 g/dL   RDW 13.1 11.5 - 15.5 %   Platelets 89 (L) 150 - 400 K/uL    Comment: SPECIMEN CHECKED FOR CLOTS REPEATED TO VERIFY PLATELET COUNT CONFIRMED BY SMEAR   Lactic acid, plasma     Status: Abnormal   Collection Time: 02/28/15  5:55 AM  Result Value Ref Range   Lactic Acid, Venous 8.0 (HH) 0.5 - 2.0 mmol/L    Comment: REPEATED TO VERIFY CRITICAL RESULT CALLED TO, READ BACK BY AND VERIFIED WITH: GARLAND,G/2W @0640  ON 02/28/15 BY KARCZEWSKI,S.   Glucose, capillary     Status: None   Collection Time: 02/28/15  7:55 AM  Result Value Ref Range   Glucose-Capillary 89 70 - 99 mg/dL  Blood gas, arterial     Status: Abnormal   Collection Time: 02/28/15 11:33 AM  Result Value Ref Range   FIO2 0.70 %   Delivery systems VENTILATOR    Mode PRESSURE REGULATED VOLUME CONTROL    VT 450 mL   Rate 35 resp/min   Peep/cpap 14.0 cm H20   pH, Arterial 7.260 (L) 7.350 - 7.450   pCO2 arterial 53.4 (H) 35.0 - 45.0 mmHg   pO2, Arterial 74.1 (L) 80.0 - 100.0 mmHg   Bicarbonate 22.6 20.0 - 24.0 mEq/L   TCO2 21.4 0 - 100 mmol/L   Acid-base deficit 3.7 (H) 0.0 - 2.0 mmol/L   O2 Saturation 87.8 %   Patient temperature 38.7    Collection site RIGHT FEMORAL  ATERIAL LINE    Drawn by [phone]    Sample type ARTERIAL DRAW   Glucose, capillary     Status: Abnormal   Collection Time: 02/28/15 11:42 AM  Result Value Ref Range   Glucose-Capillary 100 (H) 70 - 99 mg/dL  DIC (disseminated intravasc coag) panel     Status:  Abnormal   Collection Time: 02/28/15  1:31 PM  Result Value Ref Range   Prothrombin Time 20.3 (H) 11.6 - 15.2 seconds   INR 1.72 (H) 0.00 - 1.49   aPTT 46 (H) 24 - 37 seconds    Comment:        IF BASELINE aPTT IS ELEVATED, SUGGEST PATIENT RISK ASSESSMENT BE USED TO DETERMINE APPROPRIATE ANTICOAGULANT THERAPY.    Fibrinogen >800 (H) 204 - 475 mg/dL   D-Dimer, Quant 15.69 (H) 0.00 - 0.48 ug/mL-FEU    Comment:        AT THE INHOUSE ESTABLISHED CUTOFF VALUE OF 0.48 ug/mL FEU, THIS ASSAY HAS BEEN DOCUMENTED IN THE LITERATURE TO HAVE A SENSITIVITY AND NEGATIVE PREDICTIVE VALUE OF AT LEAST 98 TO 99%.  THE TEST RESULT SHOULD BE CORRELATED WITH AN ASSESSMENT OF THE CLINICAL PROBABILITY OF DVT / VTE.    Platelets 82 (L) 150 - 400 K/uL    Comment: RESULT REPEATED AND VERIFIED PLATELET COUNT CONFIRMED BY SMEAR SPECIMEN CHECKED FOR CLOTS    Smear Review NO SCHISTOCYTES SEEN   Basic metabolic panel     Status: Abnormal   Collection Time: 02/28/15  1:32 PM  Result Value Ref Range   Sodium 138 135 - 145 mmol/L   Potassium 4.3 3.5 - 5.1 mmol/L   Chloride 97 96 - 112 mmol/L   CO2 22 19 - 32 mmol/L   Glucose, Bld 117 (H) 70 - 99 mg/dL   BUN 72 (H) 6 - 23 mg/dL   Creatinine, Ser 5.21 (H) 0.50 - 1.35 mg/dL   Calcium 6.2 (LL) 8.4 - 10.5 mg/dL    Comment: REPEATED TO VERIFY CRITICAL RESULT CALLED TO, READ BACK BY AND VERIFIED WITH: SEEL,S. RN @1403  ON 4.6.16 BY MCCOY, N.    GFR calc non Af Amer 10 (L) >90 mL/min   GFR calc Af Amer 11 (L) >90 mL/min    Comment: (NOTE) The eGFR has been calculated using the CKD EPI equation. This calculation has not been validated in all clinical situations. eGFR's persistently <90 mL/min signify possible Chronic Kidney Disease.    Anion gap 19 (H) 5 - 15  Glucose, capillary     Status: Abnormal   Collection Time: 02/28/15  5:17 PM  Result Value Ref Range   Glucose-Capillary 65 (L) 70 - 99 mg/dL   Ct Abdomen Pelvis Wo Contrast  03/18/2015    CLINICAL DATA:  Abdominal pain, distention, status post gastric surgery on 02/20/2015, evaluate for perforation  EXAM: CT ABDOMEN AND PELVIS WITHOUT CONTRAST  TECHNIQUE: Multidetector CT imaging of the abdomen and pelvis was performed following the standard protocol without IV contrast.  COMPARISON:  11/13/2014  FINDINGS: Motion degraded images.  Lower chest: Patchy bilateral lower lobe opacities, likely atelectasis. Small bilateral pleural effusions.  Hepatobiliary: Multiple hepatic cysts, including a 5.3 cm cyst in the posterior segment right hepatic lobe (series 2/ image 15).  Pancreas: Within normal limits.  Spleen: Within normal limits.  Adrenals/Urinary Tract: 2.9 cm left adrenal adenoma.  Right adrenal gland is within normal limits.  4.3 cm right upper pole renal cyst. Left kidney is within normal limits. No hydronephrosis.  Bladder  is decompressed by indwelling Foley catheter.  Stomach/Bowel: Postsurgical changes with surgical anastomosis along the lesser curvature of the stomach (series 2/ image 31).  Enteric tube terminates in the posterior gastric cardia (series 2/image 17). However, the mid portion of the enteric tube is likely extraluminal, as the anastomosis along the anterior wall of the stomach is suspected to be widely dehiscent (series 2/image 30).  Associated moderate volume pneumoperitoneum/free air. Associated free extravasation of contrast into the left mid abdomen (series 2/image 20). Moderate volume abdominopelvic ascites.  Vascular/Lymphatic: Mild atherosclerotic calcifications of the abdominal aorta and branch vessels.  No suspicious abdominopelvic lymphadenopathy.  Reproductive: Brachytherapy seeds in the prostate.  Other: Subcutaneous gas with skin staples along the anterior abdominal wall.  Musculoskeletal: Degenerative changes of the visualized thoracolumbar spine.  IMPRESSION: Surgical anastomosis along the anterior wall stomach is suspected to be widely dehiscent.  Associated moderate  volume pneumoperitoneum/free air, free extravasation of contrast into the left mid abdomen, and moderate volume abdominopelvic ascites.  Additional ancillary findings above.  Critical Value/emergent results were called by telephone at the time of interpretation on 03/05/2015 at 4:40 pm to Dr. Autumn Messing and at 4:45 pm to Dr. Waldron Labs, who verbally acknowledged these results.   Electronically Signed   By: Julian Hy M.D.   On: 03/22/2015 16:56   US Renal  02/25/2015   CLINICAL DATA:  Initial evaluation for acute renal failure, history of prostate cancer  EXAM: RENAL/URINARY TRACT ULTRASOUND COMPLETE  COMPARISON:  None.  FINDINGS: Right Kidney:  Length: 10.9 cm. Mildly increased echogenicity. No hydronephrosis. Multiple cysts the largest measuring 4.5 cm.  Left Kidney:  Length: 9.8 cm. Mildly increased echogenicity. No hydronephrosis. Multiple cysts, the largest measuring 2.3 cm.  Bladder:  Not visualized.  Foley catheter is present.  There is a small volume of ascites.  IMPRESSION: Evidence of medical renal disease.  Bilateral renal cysts.   Electronically Signed   By: Skipper Cliche M.D.   On: 03/04/2015 10:43   Nm Pulmonary Perf And Vent  03/19/2015   CLINICAL DATA:  Shortness of breath.  Recent abdominal surgery.  EXAM: NUCLEAR MEDICINE VENTILATION - PERFUSION LUNG SCAN  TECHNIQUE: Ventilation images were obtained in multiple projections using inhaled aerosol technetium 99 M DTPA. Perfusion images were obtained in multiple projections after intravenous injection of Tc-83mMAA.  RADIOPHARMACEUTICALS:  40.0 mCi Tc-932mTPA aerosol and 5.1 mCi Tc-9953mA  COMPARISON:  Chest x-ray 03/13/2015  FINDINGS: Ventilation: Diffuse patchy heterogeneous ventilation study with clumping of the radiopharmaceutical.  Perfusion: No definite segmental or subsegmental perfusion defects to suggest pulmonary embolism.  IMPRESSION: Abnormal ventilation scan but no definite perfusion defects to suggest pulmonary embolism.    Electronically Signed   By: P. Marijo SanesD.   On: 03/06/2015 20:35   Dg Chest Port 1 View  02/28/2015   CLINICAL DATA:  Respiratory failure.  EXAM: PORTABLE CHEST - 1 VIEW  COMPARISON:  03/07/2015 .  FINDINGS: Tracheostomy tube, right IJ line, NG tube in stable position. Heart size stable. Prominent bilateral pulmonary infiltrates with progressive atelectatic change. Small left pleural effusion cannot be excluded. No pneumothorax. Surgical clips in the neck.  IMPRESSION: 1. Lines and tubes in stable position. 2. Persistent bilateral prominent pulmonary infiltrates with progressive atelectatic changes . Small left pleural effusion cannot be excluded.   Electronically Signed   By: ThoSale CityOn: 02/28/2015 07:12   Dg Chest Port 1 View  03/05/2015   CLINICAL DATA:  Status post intubation.  EXAM: PORTABLE CHEST - 1 VIEW  COMPARISON:  Same day.  FINDINGS: Stable cardiomediastinal silhouette. Endotracheal tube is in grossly good position with distal tip 3.6 cm above the carina. Nasogastric tube is seen looped within proximal stomach. No pneumothorax or significant pleural effusion is noted. Right internal jugular catheter is noted with distal tip overlying expected position of the SVC. Mild left basilar opacity is noted concerning for atelectasis or pneumonia. Mild right upper lobe opacity is noted concerning for edema or possibly pneumonia.  IMPRESSION: Endotracheal tube and nasogastric tube in grossly good position. Right internal jugular catheter tip seen in expected position of the SVC without evidence of pneumothorax. Increased right upper lobe opacity is noted concerning for edema or possibly pneumonia. Left basilar opacity is again noted concerning for atelectasis or pneumonia.   Electronically Signed   By: Marijo Conception, M.D.   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 He has no signs of an intact brainstem reflexes and EEG shows high likelihood of no remaining cerebral cortical electrolyte activity. Patient is still on sedating medication, however.  Prognosis is very poor for recovery to any reasonable quality of independent functioning, if he were to survive this acute illness. Clinical and EEG findings as well as prognosis were discussed with 2 daughters as well as other family members present.  Recommend CT scan of the head to rule out postanoxic changes, including cytotoxic swelling, as well as cerebral infarction(s).  We will continue to follow this patient with you.  This patient is critically ill and at significant risk of neurological worsening and death, and care requires constant monitoring of vital signs, hemodynamics,respiratory and cardiac monitoring, neurological assessment, discussion with family, other specialists and medical decision making of high complexity. Total critical care time was 55 minutes  C.R. Nicole Kindred, MD [facility]  02/28/2015, 6:04 PM

## 2015-02-28 NOTE — Progress Notes (Signed)
[facility] Progress Note Patient Name: [name] ISENSEE Sr. DOB: Aug 28, 1941 MRN: [id number]   Date of Service  02/28/2015  HPI/Events of Note  Yeast in blood hypocalcemia  eICU Interventions  micafungin per pharm Replace calcium     Intervention Category Major Interventions: Infection - evaluation and management Intermediate Interventions: Diagnostic test evaluation  Saiya Crist 02/28/2015, 6:57 AM

## 2015-03-01 ENCOUNTER — Inpatient Hospital Stay (HOSPITAL_COMMUNITY): Payer: PPO

## 2015-03-01 DIAGNOSIS — R6521 Severe sepsis with septic shock: Secondary | ICD-10-CM

## 2015-03-01 DIAGNOSIS — B49 Unspecified mycosis: Secondary | ICD-10-CM

## 2015-03-01 DIAGNOSIS — K651 Peritoneal abscess: Secondary | ICD-10-CM

## 2015-03-01 DIAGNOSIS — G931 Anoxic brain damage, not elsewhere classified: Secondary | ICD-10-CM

## 2015-03-01 DIAGNOSIS — A419 Sepsis, unspecified organism: Secondary | ICD-10-CM

## 2015-03-01 DIAGNOSIS — T814XXS Infection following a procedure, sequela: Secondary | ICD-10-CM

## 2015-03-01 LAB — COMPREHENSIVE METABOLIC PANEL
ALT: 124 U/L — ABNORMAL HIGH (ref 0–53)
AST: 180 U/L — AB (ref 0–37)
Albumin: 1.6 g/dL — ABNORMAL LOW (ref 3.5–5.2)
Alkaline Phosphatase: 108 U/L (ref 39–117)
Anion gap: 16 — ABNORMAL HIGH (ref 5–15)
BUN: 74 mg/dL — ABNORMAL HIGH (ref 6–23)
CALCIUM: 6 mg/dL — AB (ref 8.4–10.5)
CHLORIDE: 94 mmol/L — AB (ref 96–112)
CO2: 26 mmol/L (ref 19–32)
CREATININE: 5.19 mg/dL — AB (ref 0.50–1.35)
GFR calc Af Amer: 11 mL/min — ABNORMAL LOW (ref >90)
GFR calc non Af Amer: 10 mL/min — ABNORMAL LOW (ref >90)
Glucose, Bld: 147 mg/dL — ABNORMAL HIGH (ref 70–99)
Potassium: 4.8 mmol/L (ref 3.5–5.1)
SODIUM: 136 mmol/L (ref 135–145)
TOTAL PROTEIN: 4.5 g/dL — AB (ref 6.0–8.3)
Total Bilirubin: 2.4 mg/dL — ABNORMAL HIGH (ref 0.3–1.2)

## 2015-03-01 LAB — CULTURE, RESPIRATORY: SPECIAL REQUESTS: NORMAL

## 2015-03-01 LAB — CBC
HCT: 26.1 % — ABNORMAL LOW (ref 39.0–52.0)
Hemoglobin: 8.8 g/dL — ABNORMAL LOW (ref 13.0–17.0)
MCH: 30.9 pg (ref 26.0–34.0)
MCHC: 33.7 g/dL (ref 30.0–36.0)
MCV: 91.6 fL (ref 78.0–100.0)
Platelets: 62 10*3/uL — ABNORMAL LOW (ref 150–400)
RBC: 2.85 MIL/uL — ABNORMAL LOW (ref 4.22–5.81)
RDW: 14.4 % (ref 11.5–15.5)
WBC: 35.2 10*3/uL — ABNORMAL HIGH (ref 4.0–10.5)

## 2015-03-01 LAB — BLOOD GAS, ARTERIAL
Acid-Base Excess: 0.2 mmol/L (ref 0.0–2.0)
BICARBONATE: 25.6 meq/L — AB (ref 20.0–24.0)
Drawn by: 235321
FIO2: 0.7 %
O2 Saturation: 97.4 %
PATIENT TEMPERATURE: 101.1
PEEP/CPAP: 14 cmH2O
PH ART: 7.319 — AB (ref 7.350–7.450)
RATE: 35 resp/min
TCO2: 24.4 mmol/L (ref 0–100)
VT: 450 mL
pCO2 arterial: 52.3 mmHg — ABNORMAL HIGH (ref 35.0–45.0)
pO2, Arterial: 113 mmHg — ABNORMAL HIGH (ref 80.0–100.0)

## 2015-03-01 LAB — CULTURE, RESPIRATORY W GRAM STAIN

## 2015-03-01 LAB — GLUCOSE, CAPILLARY
GLUCOSE-CAPILLARY: 137 mg/dL — AB (ref 70–99)
Glucose-Capillary: 146 mg/dL — ABNORMAL HIGH (ref 70–99)
Glucose-Capillary: 19 mg/dL — CL (ref 70–99)

## 2015-03-01 LAB — LACTIC ACID, PLASMA: LACTIC ACID, VENOUS: 5 mmol/L — AB (ref 0.5–2.0)

## 2015-03-01 LAB — MAGNESIUM: Magnesium: 1.9 mg/dL (ref 1.5–2.5)

## 2015-03-01 LAB — PHOSPHORUS: Phosphorus: 6.4 mg/dL — ABNORMAL HIGH (ref 2.3–4.6)

## 2015-03-01 MED ORDER — MORPHINE SULFATE 25 MG/ML IV SOLN
10.0000 mg/h | INTRAVENOUS | Status: DC
  Administered 2015-03-01 (×2): 10 mg/h via INTRAVENOUS
  Filled 2015-03-01: qty 10

## 2015-03-01 MED ORDER — MORPHINE BOLUS VIA INFUSION
5.0000 mg | INTRAVENOUS | Status: DC | PRN
  Filled 2015-03-01: qty 20

## 2015-03-01 MED ORDER — SODIUM CHLORIDE 0.9 % IV SOLN
1.0000 g | Freq: Once | INTRAVENOUS | Status: AC
  Administered 2015-03-01: 1 g via INTRAVENOUS
  Filled 2015-03-01: qty 10

## 2015-03-02 LAB — GLUCOSE, CAPILLARY: Glucose-Capillary: 136 mg/dL — ABNORMAL HIGH (ref 70–99)

## 2015-03-03 LAB — TYPE AND SCREEN
ABO/RH(D): AB POS
Antibody Screen: NEGATIVE
UNIT DIVISION: 0
Unit division: 0
Unit division: 0
Unit division: 0

## 2015-03-03 LAB — CULTURE, BLOOD (ROUTINE X 2)

## 2015-03-04 LAB — CULTURE, BLOOD (ROUTINE X 2)

## 2015-03-06 ENCOUNTER — Telehealth (HOSPITAL_COMMUNITY): Payer: Self-pay

## 2015-03-06 NOTE — Telephone Encounter (Signed)
Call from Lab trying to give (+) Bld Cx results.  Informed pt deceased provided PCP name( Dr Kathlene November) and Contact info to the lab

## 2015-03-08 ENCOUNTER — Telehealth: Payer: Self-pay

## 2015-03-08 NOTE — Telephone Encounter (Signed)
Received death certificate from [place] 03/08/15.  Sending to Dr. Lamonte Sakai for signature.  Received back and called for pick-up 03/08/15.

## 2015-03-09 LAB — CULTURE, BLOOD (ROUTINE X 2)

## 2015-03-15 ENCOUNTER — Telehealth: Payer: Self-pay | Admitting: Internal Medicine

## 2015-03-15 NOTE — Telephone Encounter (Signed)
I spoke with Mr. Flessner wife over the phone, I offered her my condolences for the lost of her has been. She is quite upset about the care she got at another hospital. She knows I am available for any help she may require. She expressed gratitude for my phone call.

## 2015-03-21 NOTE — Discharge Summary (Signed)
PULMONARY / CRITICAL CARE MEDICINE   Name: [name] AJA Sr. MRN: [id number] DOB: [birth year]    ADMISSION DATE:  03/19/2015 CONSULTATION DATE:  03/26/2015 Date of Death: 03-28-15  REFERRING MD :  Dr. 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 His initial work up  Was consistent with LLL PNA.  On 2023/03/26 the patient was noted to have change in mental status and rising lactate despite antibiotic treatment. PCCM consulted. Patient subsequently decompensated with cardiopulmonary arrest requiring ~20 min ACLS with ROSC. CT of the abdomen revealed gastric leak with intra-abd abscesses. He was taken to the OR urgently on 03/26/2023. He returned from the OR mechanically ventilated and in shock. He was supported with broad-spectrum antimicrobials. Despite this he continued to remain hypotensive. With evidence of renal dysfunction. He did not wake up despite lightening his sedation which was concerning for possible anoxic injury. He was evaluated by neurology and was felt that unfortunately he had suffered a significant brain injury during his cardiopulmonary arrest. Discussions were undertaken with the patient's family given this new information and his poor  prognosis and decision made was to withdraw care. This was done on Mar 28, 2015 and he expired later that day..   STUDIES:  03-26-23  CT ABD/Pelvis >> Mar 26, 2023  ECHO >> moderate LV concentric hypertrophy, EF 55%,    SIGNIFICANT EVENTS: 2/29  Seen at [facility] by Dr. Raul Del for abdominal tumor  3/29  [facility] for tumor resection  ----- 4/04  Son called [facility] office with reports of father with increasing SOB.  Presented to [facility] for dyspnea / chills.  Admitted per [facility] 03-26-23  PCCM consulted for rising lactic acid, hypotension  2023-03-26  To OR for gastric leak, drainage of intra-abd abscesses   Baltazar Apo, MD, PhD 03/21/2015, 10:44 PM [facility] [phone] or if no answer [phone]

## 2015-03-25 NOTE — Progress Notes (Signed)
Wasted 230 ml of Morphine 250mg  in 250 ml. Witnessed by [employer].

## 2015-03-25 NOTE — Progress Notes (Signed)
Follow up with pt's spouse, children around impending testing.    In private conversation with chaplain, children report "knowing he is gone" and "making peace with this."  They are supportive of their mother, who they understand as "not ready to let go yet."  They are aware of grief dynamics around losing a partner of 35 years and speak with chaplain about "letting her arrive at that place on her own."   Pt's spouse expressed some feelings of uncertainty / guilt around pt being on ventilator, as she reported his advance directive indicates that he would not want this.  Children are supportive of her in reminding her that she made decisions in the moment to the best of her ability and her primary objective was to care for him as well as she could.  She is preparing for the possibility of terminal wean today and is taking comfort in knowledge that he would not wish to remain on support if there is not hope for recovery.    Family's faith figures prominently in their coping and their community minister has been present with them in hospital.   [place], Page

## 2015-03-25 NOTE — Progress Notes (Signed)
[facility] Progress Note  2 Days Post-Op  Subjective: Pt on 2 pressors, intubated, sedated, unresponsive.  Family at bedside.  No apparent pain on exam.  Objective: Vital signs in last 24 hours: Temp:  [98.1 F (36.7 C)-101.8 F (38.8 C)] 100.4 F (38 C) (04/07 0600) Pulse Rate:  [69-133] 95 (04/07 0600) Resp:  [26-36] 35 (04/07 0600) BP: (95-134)/(50-63) 121/63 mmHg (04/07 0322) SpO2:  [90 %-100 %] 100 % (04/07 0600) FiO2 (%):  [70 %] 70 % (04/07 0412) Weight:  [100.2 kg (220 lb 14.4 oz)] 100.2 kg (220 lb 14.4 oz) (04/07 0210) Last BM Date: 03/03/2015  Intake/Output from previous day: 04/06 0701 - 04/07 0700 In: 6582.8 [I.V.:5642.8; IV Piggyback:940] Out: 507 [Urine:357; Emesis/NG output:50; Drains:100] Intake/Output this shift:    PE: Gen:  Alert, NAD, pleasant Card:  RRR, no M/G/R heard Pulm:  Course vent breath sounds b/l Abd: Soft, NT/ND, +BS, no HSM, midline wound clean/dry with sutures visable, drain with minimal serous drainage Ext:  No erythema, edema, or tenderness, SCD's in place  Lab Results:   Recent Labs  02/28/15 0555 02/28/15 1331 Mar 15, 2015 0440  WBC 9.2  --  35.2*  HGB 10.3*  --  8.8*  HCT 28.8*  --  26.1*  PLT 89* 82* 62*   BMET  Recent Labs  02/28/15 1332 03-15-15 0440  NA 138 136  K 4.3 4.8  CL 97 94*  CO2 22 26  GLUCOSE 117* 147*  BUN 72* 74*  CREATININE 5.21* 5.19*  CALCIUM 6.2* 6.0*   PT/INR  Recent Labs  02/28/2015 2100 02/28/15 1331  LABPROT 19.5* 20.3*  INR 1.64* 1.72*   CMP     Component Value Date/Time   NA 136 03/15/15 0440   K 4.8 03/15/2015 0440   CL 94* March 15, 2015 0440   CO2 26 2015-03-15 0440   GLUCOSE 147* 15-Mar-2015 0440   GLUCOSE 99 10/28/2006 1014   BUN 74* 2015-03-15 0440   CREATININE 5.19* 03-15-15 0440   CREATININE 1.39* 10/18/2014 1556   CALCIUM 6.0* March 15, 2015 0440   PROT 4.5* 03-15-2015 0440   ALBUMIN 1.6* 2015-03-15 0440   AST 180* 2015-03-15 0440   ALT 124* 03-15-15 0440    ALKPHOS 108 March 15, 2015 0440   BILITOT 2.4* 03/15/15 0440   GFRNONAA 10* 2015-03-15 0440   GFRNONAA 50* 10/18/2014 1556   GFRAA 11* 2015/03/15 0440   GFRAA 57* 10/18/2014 1556   Lipase     Component Value Date/Time   LIPASE 93 11/20/2008 1420       Studies/Results: Ct Abdomen Pelvis Wo Contrast  03/12/2015   CLINICAL DATA:  Abdominal pain, distention, status post gastric surgery on 02/20/2015, evaluate for perforation  EXAM: CT ABDOMEN AND PELVIS WITHOUT CONTRAST  TECHNIQUE: Multidetector CT imaging of the abdomen and pelvis was performed following the standard protocol without IV contrast.  COMPARISON:  11/13/2014  FINDINGS: Motion degraded images.  Lower chest: Patchy bilateral lower lobe opacities, likely atelectasis. Small bilateral pleural effusions.  Hepatobiliary: Multiple hepatic cysts, including a 5.3 cm cyst in the posterior segment right hepatic lobe (series 2/ image 15).  Pancreas: Within normal limits.  Spleen: Within normal limits.  Adrenals/Urinary Tract: 2.9 cm left adrenal adenoma.  Right adrenal gland is within normal limits.  4.3 cm right upper pole renal cyst. Left kidney is within normal limits. No hydronephrosis.  Bladder is decompressed by indwelling Foley catheter.  Stomach/Bowel: Postsurgical changes with surgical anastomosis along the lesser curvature of the stomach (series 2/ image 31).  Enteric tube terminates in the posterior gastric cardia (series 2/image 17). However, the mid portion of the enteric tube is likely extraluminal, as the anastomosis along the anterior wall of the stomach is suspected to be widely dehiscent (series 2/image 30).  Associated moderate volume pneumoperitoneum/free air. Associated free extravasation of contrast into the left mid abdomen (series 2/image 20). Moderate volume abdominopelvic ascites.  Vascular/Lymphatic: Mild atherosclerotic calcifications of the abdominal aorta and branch vessels.  No suspicious abdominopelvic lymphadenopathy.   Reproductive: Brachytherapy seeds in the prostate.  Other: Subcutaneous gas with skin staples along the anterior abdominal wall.  Musculoskeletal: Degenerative changes of the visualized thoracolumbar spine.  IMPRESSION: Surgical anastomosis along the anterior wall stomach is suspected to be widely dehiscent.  Associated moderate volume pneumoperitoneum/free air, free extravasation of contrast into the left mid abdomen, and moderate volume abdominopelvic ascites.  Additional ancillary findings above.  Critical Value/emergent results were called by telephone at the time of interpretation on 03/24/2015 at 4:40 pm to Dr. Autumn Messing and at 4:45 pm to Dr. Waldron Labs, who verbally acknowledged these results.   Electronically Signed   By: Julian Hy M.D.   On: 03/23/2015 16:56   US Renal  03/07/2015   CLINICAL DATA:  Initial evaluation for acute renal failure, history of prostate cancer  EXAM: RENAL/URINARY TRACT ULTRASOUND COMPLETE  COMPARISON:  None.  FINDINGS: Right Kidney:  Length: 10.9 cm. Mildly increased echogenicity. No hydronephrosis. Multiple cysts the largest measuring 4.5 cm.  Left Kidney:  Length: 9.8 cm. Mildly increased echogenicity. No hydronephrosis. Multiple cysts, the largest measuring 2.3 cm.  Bladder:  Not visualized.  Foley catheter is present.  There is a small volume of ascites.  IMPRESSION: Evidence of medical renal disease.  Bilateral renal cysts.   Electronically Signed   By: Skipper Cliche M.D.   On: 03/09/2015 10:43   Dg Chest Port 1 View  2015-03-02   CLINICAL DATA:  Respiratory failure.  EXAM: PORTABLE CHEST - 1 VIEW  COMPARISON:  02/28/2015 .  FINDINGS: Endotracheal tube, NG tube, right IJ line in stable position. Persistent bilateral pulmonary infiltrates and basilar atelectasis. No pleural effusion or pneumothorax.  IMPRESSION: 1. Lines and tubes in stable position. 2. Persistent bilateral pulmonary infiltrates and bibasilar atelectasis. No interim change.   Electronically Signed    By: Redwood   On: 03-02-2015 07:34   Dg Chest Port 1 View  02/28/2015   CLINICAL DATA:  Respiratory failure.  EXAM: PORTABLE CHEST - 1 VIEW  COMPARISON:  03/16/2015 .  FINDINGS: Tracheostomy tube, right IJ line, NG tube in stable position. Heart size stable. Prominent bilateral pulmonary infiltrates with progressive atelectatic change. Small left pleural effusion cannot be excluded. No pneumothorax. Surgical clips in the neck.  IMPRESSION: 1. Lines and tubes in stable position. 2. Persistent bilateral prominent pulmonary infiltrates with progressive atelectatic changes . Small left pleural effusion cannot be excluded.   Electronically Signed   By: Frankenmuth   On: 02/28/2015 07:12   Dg Chest Port 1 View  03/18/2015   CLINICAL DATA:  Status post intubation.  EXAM: PORTABLE CHEST - 1 VIEW  COMPARISON:  Same day.  FINDINGS: Stable cardiomediastinal silhouette. Endotracheal tube is in grossly good position with distal tip 3.6 cm above the carina. Nasogastric tube is seen looped within proximal stomach. No pneumothorax or significant pleural effusion is noted. Right internal jugular catheter is noted with distal tip overlying expected position of the SVC. Mild left basilar opacity is noted concerning for atelectasis or  pneumonia. Mild right upper lobe opacity is noted concerning for edema or possibly pneumonia.  IMPRESSION: Endotracheal tube and nasogastric tube in grossly good position. Right internal jugular catheter tip seen in expected position of the SVC without evidence of pneumothorax. Increased right upper lobe opacity is noted concerning for edema or possibly pneumonia. Left basilar opacity is again noted concerning for atelectasis or pneumonia.   Electronically Signed   By: Marijo Conception, M.D.   On: 03/20/2015 12:33    Anti-infectives: Anti-infectives    Start     Dose/Rate Route Frequency Ordered Stop   02/28/15 2200  levofloxacin (LEVAQUIN) IVPB 500 mg  Status:  Discontinued      500 mg 100 mL/hr over 60 Minutes Intravenous Every 48 hours 03/22/2015 2219 03/22/2015 0657   02/28/15 1800  vancomycin (VANCOCIN) IVPB 1000 mg/200 mL premix     1,000 mg 200 mL/hr over 60 Minutes Intravenous Every 48 hours 03/05/2015 1251     02/28/15 0715  micafungin (MYCAMINE) 100 mg in sodium chloride 0.9 % 100 mL IVPB     100 mg 100 mL/hr over 1 Hours Intravenous Daily 02/28/15 0704     03/07/2015 1615  micafungin (MYCAMINE) 100 mg in sodium chloride 0.9 % 100 mL IVPB  Status:  Discontinued     100 mg 100 mL/hr over 1 Hours Intravenous Daily 02/28/2015 1606 03/21/2015 1611   03/13/2015 1600  micafungin (MYCAMINE) 100 mg in sodium chloride 0.9 % 100 mL IVPB     100 mg 100 mL/hr over 1 Hours Intravenous every 24 hours 03/23/2015 1606 03/03/2015 1905   03/07/2015 1400  imipenem-cilastatin (PRIMAXIN) 250 mg in sodium chloride 0.9 % 100 mL IVPB     250 mg 200 mL/hr over 30 Minutes Intravenous Every 12 hours 03/03/2015 1247     03/18/2015 0715  cefTRIAXone (ROCEPHIN) 1 g in dextrose 5 % 50 mL IVPB  Status:  Discontinued     1 g 100 mL/hr over 30 Minutes Intravenous Daily 03/11/2015 0657 03/15/2015 1243   03/08/2015 0715  azithromycin (ZITHROMAX) 500 mg in dextrose 5 % 250 mL IVPB  Status:  Discontinued     500 mg 250 mL/hr over 60 Minutes Intravenous Daily 03/21/2015 0657 02/28/15 0954   02/25/2015 2300  levofloxacin (LEVAQUIN) IVPB 750 mg     750 mg 100 mL/hr over 90 Minutes Intravenous  Once 03/13/2015 2219 03/16/2015 0030   03/14/2015 1800  vancomycin (VANCOCIN) IVPB 1000 mg/200 mL premix     1,000 mg 200 mL/hr over 60 Minutes Intravenous STAT 03/20/2015 1749 02/28/2015 2101   02/24/2015 1730  ceFEPIme (MAXIPIME) 2 g in dextrose 5 % 50 mL IVPB     2 g 100 mL/hr over 30 Minutes Intravenous  Once 02/28/2015 1726 02/28/2015 1852     SIGNIFICANT EVENTS: 2/29 Seen at [facility] by Dr. 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 Presented to [facility] for dyspnea / chills. Admitted per [facility] 4/05 PCCM consulted for rising lactic acid, hypotension  4/05 To OR for gastric leak, drainage of intra-abd abscesses 4/07  Brain MRI pending  Assessment/Plan Gastric staple line leak, multiple intra-abdominal abscesses POD #2 s/p Ex Lap, repair of gastric staple line leak, drainage of multiple intra-abdominal abscesses ([place] 03/20/2015) -Still critically ill on 2 pressors, on vent with ARDS protocol -Cr slightly better today -VDRF per CCM -EEG  shows high likelihood of no remaining cerebral cortical electrical activity -Neurology feels poor prognosis secondary to 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

## 2015-03-25 NOTE — Progress Notes (Signed)
Pt's body taken to [place] for possible autopsy. Transported by Corey Harold

## 2015-03-25 NOTE — Progress Notes (Signed)
[facility] Progress Note Patient Name: [name] NIPP Sr. DOB: Sep 02, 1941 MRN: [id number]   Date of Service  2015-03-03  HPI/Events of Note    eICU Interventions  Hypocalcemia, replaced     Intervention Category Minor Interventions: Electrolytes abnormality - evaluation and management  [name] Wood 2015/03/03, 6:12 AM

## 2015-03-25 NOTE — Procedures (Signed)
Extubation Procedure Note  Patient Details:   Name: [name] MUSTARD Sr. DOB: 1941/06/27 MRN: [id number]   Airway Documentation:     Evaluation  O2 sats: stable throughout Complications: No apparent complications Patient did tolerate procedure well. Bilateral Breath Sounds: Diminished Suctioning: Oral, Airway No  Baldwin Jamaica Nannette March 24, 2015, 1:40 PM   Withdrawal of Life Sustaining Measures completed by RT

## 2015-03-25 NOTE — Progress Notes (Signed)
PULMONARY / CRITICAL CARE MEDICINE   Name: [name] CHAPPLE Sr. MRN: [id number] DOB: 1941-06-17    ADMISSION DATE:  03/24/2015 CONSULTATION DATE:  March 15, 2015  REFERRING MD :  Dr. 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  Work up consistent with LLL PNA.  On March 15, 2023 the patient was noted to have change in mental status and rising lactate. PCCM consulted. Patient subsequently decompensated with cardiopulmonary arrest requiring ~20 min ACLS with ROSC. CT revealed gastric leak with intra-abd abscesses, to OR 4/5.   STUDIES:  03/15/2023  CT ABD/Pelvis >> 03-15-23  ECHO >> moderate LV concentric hypertrophy, EF 55%,    SIGNIFICANT EVENTS: 2/29  Seen at [facility] by Dr. 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  Presented to [facility] for dyspnea / chills.  Admitted per [facility] 03/15/2023  PCCM consulted for rising lactic acid, hypotension  03/15/23  To OR for gastric leak, drainage of intra-abd abscesses  SUBJECTIVE:  No changes in overall status. EEG and Neuro consultation reviewed  VITAL SIGNS: Temp:  [98.1 F (36.7 C)-101.7 F (38.7 C)] 98.6 F (37 C) (04/07 0800) Pulse Rate:  [69-98] 94 (04/07 0936) Resp:  [26-36] 35 (04/07 0936) BP: (95-121)/(50-63) 121/63 mmHg (04/07 0322) SpO2:  [90 %-100 %] 100 % (04/07 0936) FiO2 (%):  [70 %] 70 % (04/07 0936) Weight:  [100.2 kg (220 lb 14.4 oz)] 100.2 kg (220 lb 14.4 oz) (04/07 0210) HEMODYNAMICS:   VENTILATOR SETTINGS: Vent Mode:  [-] PRVC FiO2 (%):  [70 %] 70 % Set Rate:  [35 bmp] 35 bmp Vt Set:  [450 mL] 450 mL PEEP:  [12 cmH20-14 cmH20] 12 cmH20 Plateau Pressure:  [26 cmH20-31 cmH20] 27 cmH20   INTAKE / OUTPUT:  Intake/Output Summary (Last 24 hours) at 03/17/2015 1152 Last data filed at 2015/03/17 1000  Gross per 24  hour  Intake 4878.05 ml  Output    527 ml  Net 4351.05 ml    PHYSICAL EXAMINATION: General:  Acutely ill adult male in NAD Neuro:  Obtunded post arrest, no current evidence myoclonus HEENT:  OETT, mm pale, moist, no jvd  Cardiovascular:  s1s2 irr irr  Lungs:  resp's even/non-labored on MV, lungs coarse bilaterally anterior, diminished lower lateral  Abdomen:  Mild distention, midline abd staples intact, bsx4 hypoactive, coffee ground emesis  Musculoskeletal:  No acute deformities  Skin:  Cool extremites/dry, no edema   LABS:  CBC  Recent Labs Lab 15-Mar-2015 2340 02/28/15 0555 02/28/15 1331 2015/03/17 0440  WBC 5.0 9.2  --  35.2*  HGB 10.4* 10.3*  --  8.8*  HCT 30.7* 28.8*  --  26.1*  PLT PLATELET CLUMPS NOTED ON SMEAR, COUNT APPEARS ADEQUATE 89* 82* 62*   Coag's  Recent Labs Lab 03-15-15 0959 15-Mar-2015 2100 02/28/15 1331  APTT  --   --  46*  INR 2.00* 1.64* 1.72*     BMET  Recent Labs Lab 02/28/15 0555 02/28/15 1332 2015/03/17 0440  NA 138 138 136  K 3.6 4.3 4.8  CL 101 97 94*  CO2 20 22 26   BUN 70* 72* 74*  CREATININE 4.73* 5.21* 5.19*  GLUCOSE 90 117* 147*   Electrolytes  Recent Labs Lab 03/18/2015 2200  03/15/15 1315  02/28/15 0555 02/28/15 1332  03/05/2015 0440  CALCIUM  --   < > 7.1*  < > 6.2* 6.2* 6.0*  MG 2.2  --  2.7*  --  1.7  --  1.9  PHOS 5.2*  --   --   --  4.1  --  6.4*  < > = values in this interval not displayed. Sepsis Markers  Recent Labs Lab 02/24/2015 0945  02/28/2015 2340 02/28/15 0555 2015/03/05 0440  LATICACIDVEN 8.5*  < > 8.6* 8.0* 5.0*  PROCALCITON 71.96  --   --  77.80  --   < > = values in this interval not displayed. ABG  Recent Labs Lab 02/28/15 0406 02/28/15 1133 03/05/2015 0358  PHART 7.465* 7.260* 7.319*  PCO2ART 27.7* 53.4* 52.3*  PO2ART 67.0* 74.1* 113.0*   Liver Enzymes  Recent Labs Lab 02/28/2015 0345 02/28/2015 1315 03-05-15 0440  AST 47* 170* 180*  ALT 36 26 124*  ALKPHOS 55 153* 108  BILITOT 1.5* 0.7  2.4*  ALBUMIN 2.7* 1.7* 1.6*   Cardiac Enzymes  Recent Labs Lab 03/21/2015 2200 03/08/2015 0345 02/23/2015 0945  TROPONINI 0.04* 0.04* 0.04*   Glucose  Recent Labs Lab 02/28/15 1142 02/28/15 1717 02/28/15 1800 02/28/15 1924 2015-03-05 0025 03/05/2015 0411  GLUCAP 100* 65* 144* 113* 146* 137*    Imaging Dg Chest Port 1 View  02/28/2015   CLINICAL DATA:  Respiratory failure.  EXAM: PORTABLE CHEST - 1 VIEW  COMPARISON:  03/02/2015 .  FINDINGS: Tracheostomy tube, right IJ line, NG tube in stable position. Heart size stable. Prominent bilateral pulmonary infiltrates with progressive atelectatic change. Small left pleural effusion cannot be excluded. No pneumothorax. Surgical clips in the neck.  IMPRESSION: 1. Lines and tubes in stable position. 2. Persistent bilateral prominent pulmonary infiltrates with progressive atelectatic changes . Small left pleural effusion cannot be excluded.   Electronically Signed   By: Marcello Moores  Register   On: 02/28/2015 07:12     ASSESSMENT / PLAN:  NEUROLOGIC A:   Post Arrest Encephalopathy - 4/5 Possible hypoxemic (or hypoglycemic) brain injury P:   Poor prognosis. Appreciate Dr Les Pou assistance.  Discussing goals for care as below  FAMILY  - Updates: good discussion with [name] this am. She is supported by family and her Pastors. She and all present understand that there is little chance for a meaningful neurological recovery. Given this information they agree that withdrawal of care is appropriate. They are prepared to do this today. I explained the process and what we should expect. [name] would like to have a post-mortem exam performed. I told her I would help facilitate this. We will add morphine to existing propofol. I would like to continue both the propofol and the keppra to avoid myoclonus. All other non-comfort meds will be titrated to off.    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 [facility]; c/b leak and intra-abd abscesses Obesity  Vent Associated Dysphagia  P:   Appreciate CCS management   HEMATOLOGIC A:   Mild Anemia - suspect chronic disease related  Thrombocytopenia, suspect due to sepsis vs DIC P:   INFECTIOUS A:   PNA - LLL noted on admit, RUL airspace disease suspected aspiration during cardiac arrest  Septic Shock, intra-abd source UTI  P:   BCx2 4/4 >> yeast >  UA 4/5 >> positive UTI UC 4/5 >>  Sputum 4/5 >> Flu PCR 4/5 >> negative   Vanco  4/4 >>  Imipenem 4/5 >>  Azithro 4/4 >> 4/6 Mycafungin 4/5 >   Will plan to d/c all abx this pm when family ready to do so  ENDOCRINE A:   Hypoglycemia  Hypothyroidism  P:   D/c CBG's   Independent critical care time is 45 minutes.   Baltazar Apo, MD, PhD 03-19-2015, 11:52 AM [facility] [phone] or if no answer [phone]

## 2015-03-25 NOTE — Progress Notes (Signed)
Pt placed on MS drip at 10 mg/h at 1245, remains on Propofol  per orders. Family members at bedside and updated. Pt taken off vent at 1335 pm and expired at 1419pm.  Pt received 1 bolus dose of MS 10mg  during wean. Pt appeared comfortable during terminal wean. .  Valuables taken home by family.

## 2015-03-25 NOTE — Progress Notes (Signed)
Patient asystole on monitor, aline reading zero, no heart or breath sounds auscultated.  Geannie Risen, RN SDillon, RN

## 2015-03-25 DEATH — deceased

## 2015-04-17 IMAGING — CR DG CHEST 2V
2 series · 2 of 2 positions shown · non-contrast
Comparison: 06/24/2011

CLINICAL DATA: Preoperative evaluation for total thyroidectomy for
multinodular goiter.  Nonsmoker.  Hypertension.  No current chest
complaints

CHEST - 2 VIEW

[w chest pa]
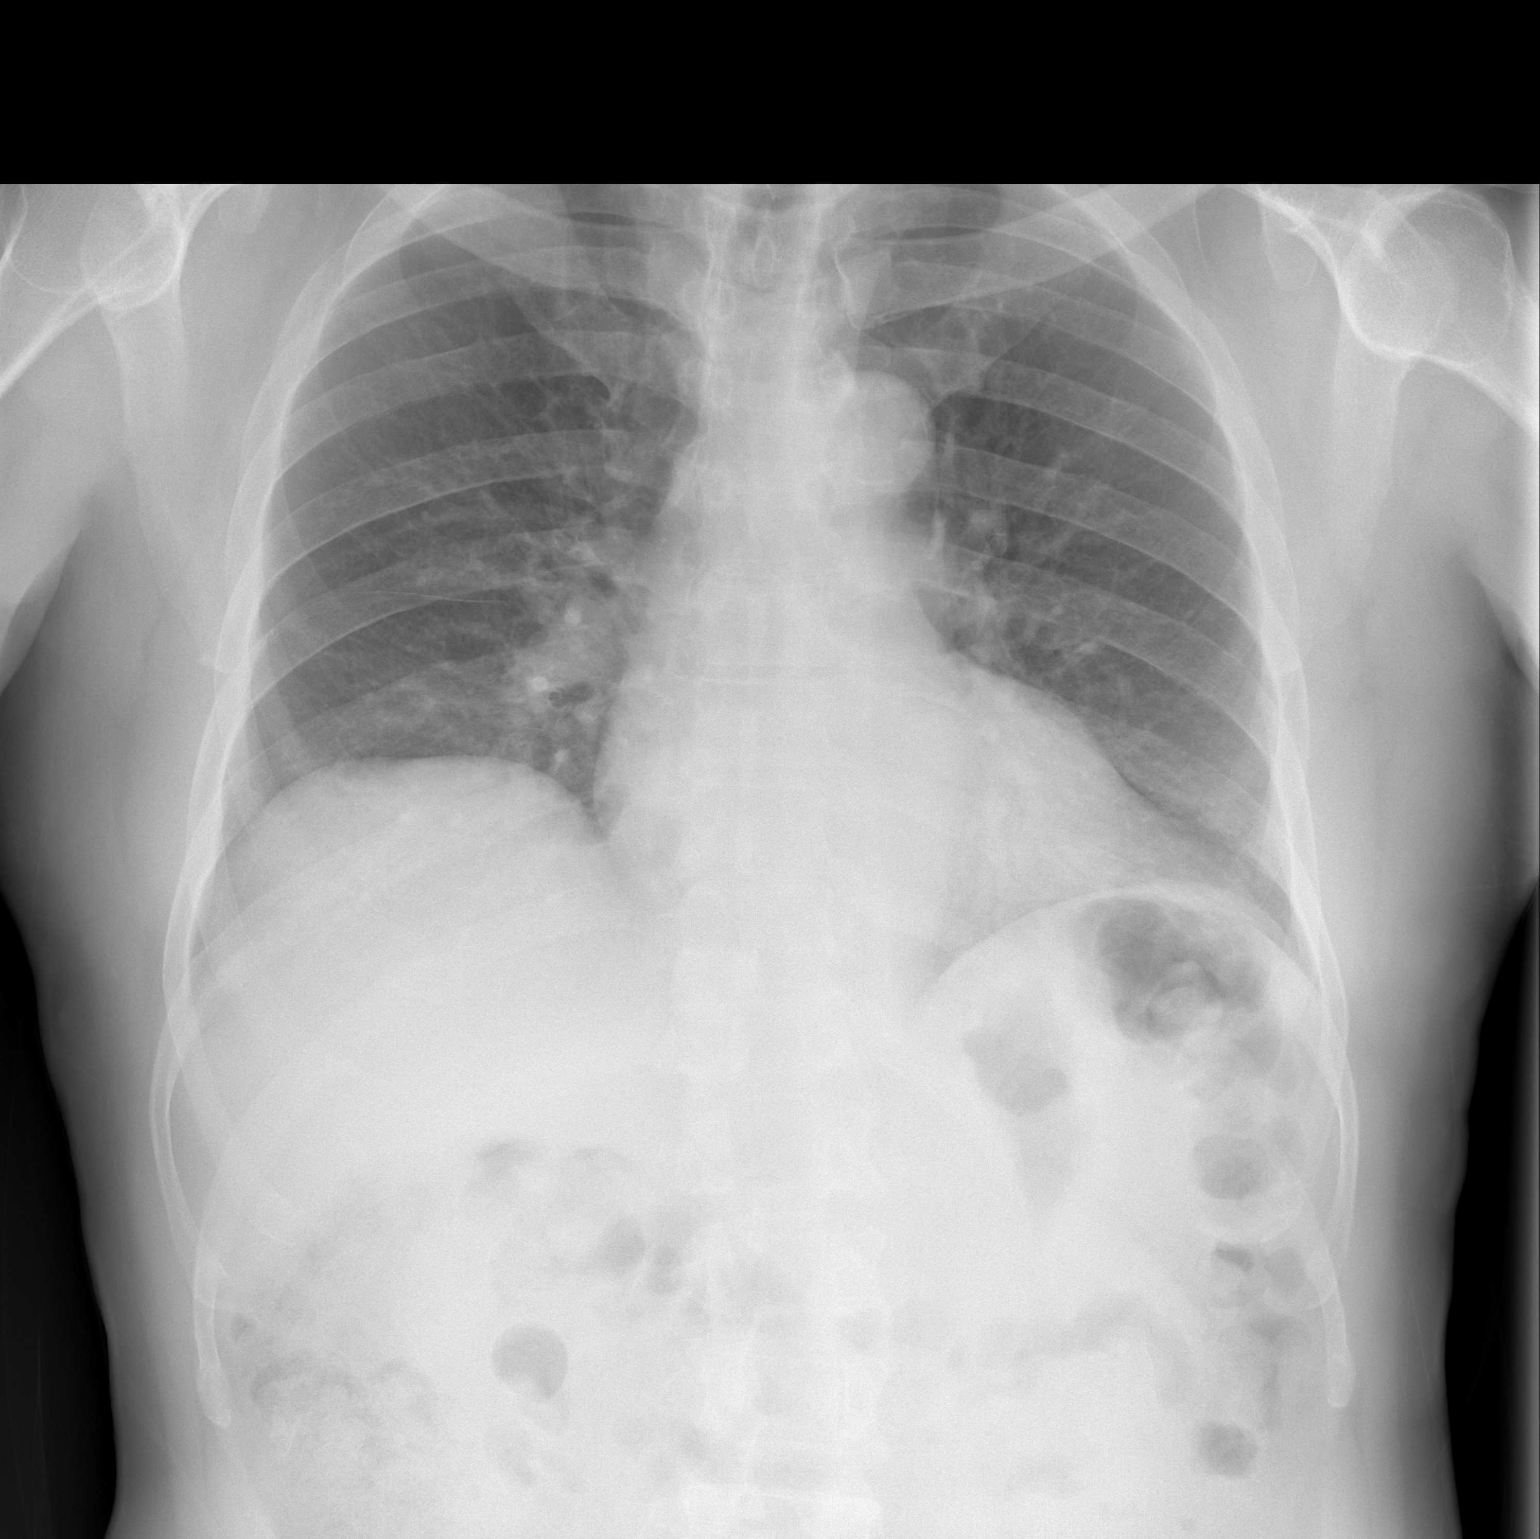

[w chest lat]
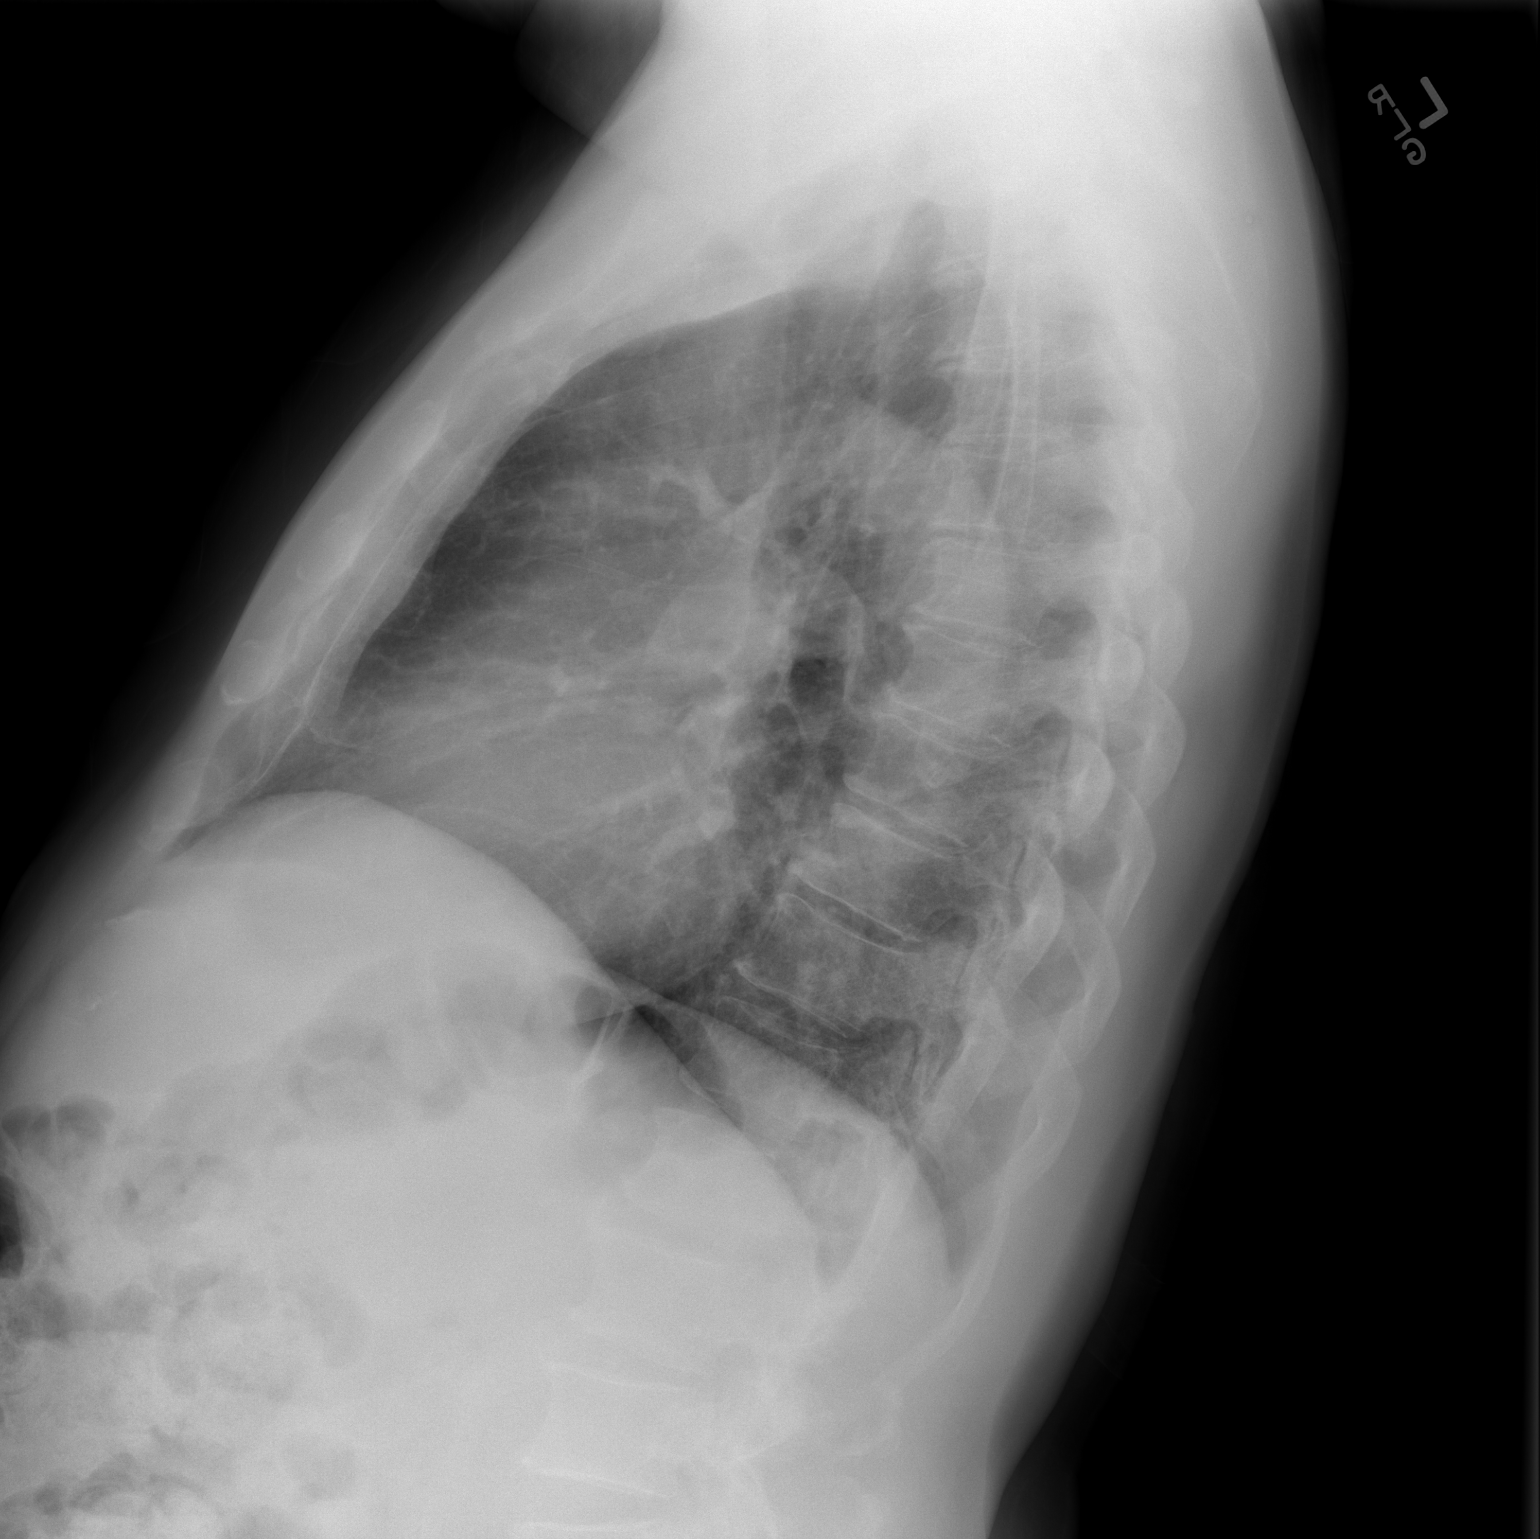

[2 of 2 positions shown; findings below may reference images not displayed]

FINDINGS: Lower lung volumes are present than on the prior exam
with some resulting crowding of bronchovascular markings.  Taking
this into consideration, heart and mediastinal contours are stable
and within normal limits.  The lung fields appear clear with no
signs of focal infiltrate or congestive failure.  No pleural fluid
or significant peribronchial cuffing is seen.

Bony structures demonstrate mild degenerative change of the mid
thoracic spine and are otherwise intact.
IMPRESSION: Lower lung volumes with an otherwise stable cardiopulmonary
appearance with no new focal or acute abnormality suggested.

## 2015-04-18 ENCOUNTER — Ambulatory Visit: Payer: Medicare HMO | Admitting: Internal Medicine

## 2015-04-30 IMAGING — US US RENAL
1 series · 13 of 25 positions shown · non-contrast
Comparison: Abdominal ultrasound 02/22/2013

CLINICAL DATA: Acute renal failure

RENAL/URINARY TRACT ULTRASOUND COMPLETE

[Series 1: us renal · 0.35mm/px · 13 of 48 slices shown]
[im 1/48]
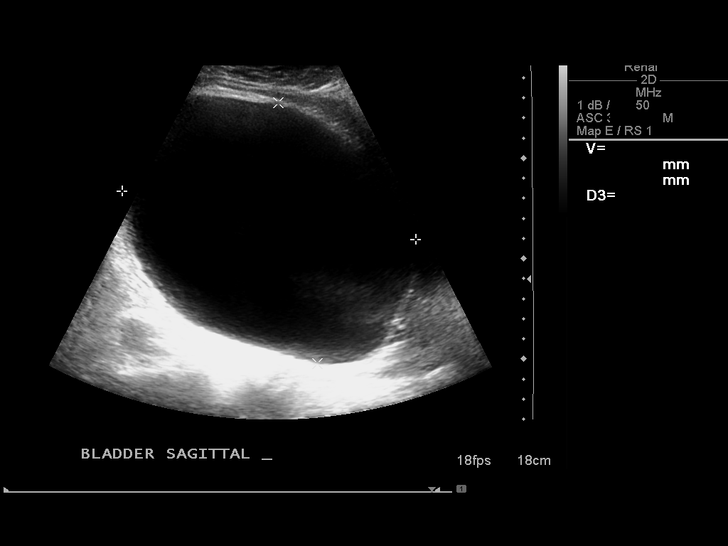
[im 4/48]
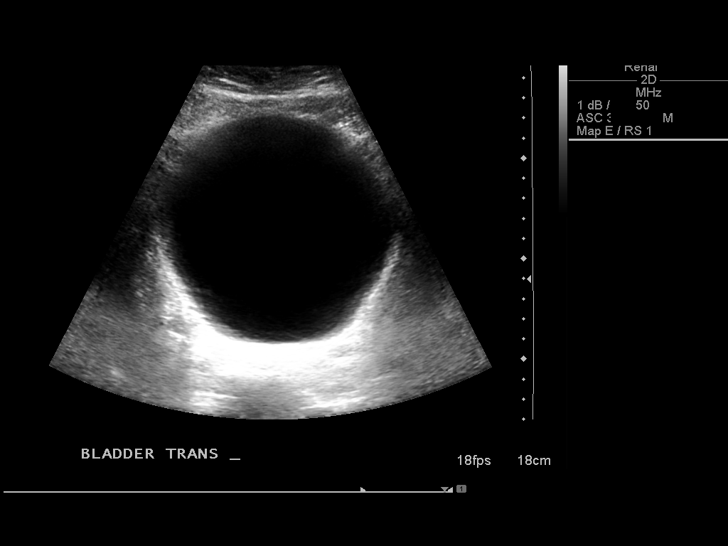
[im 8/48]
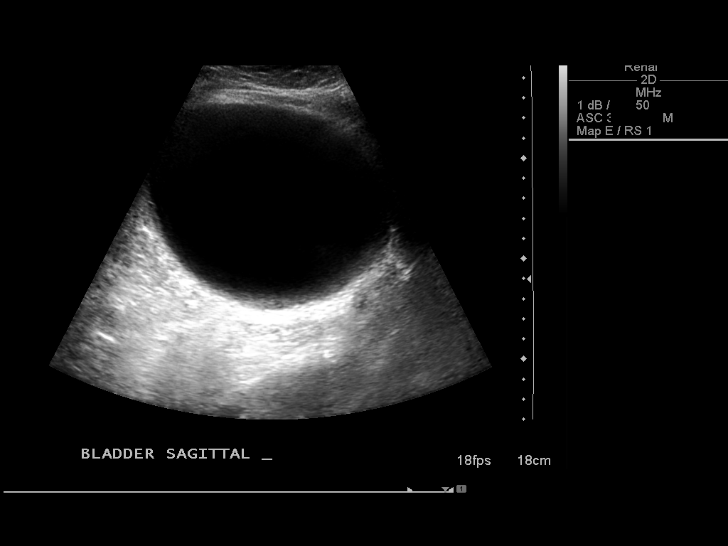
[im 12/48]
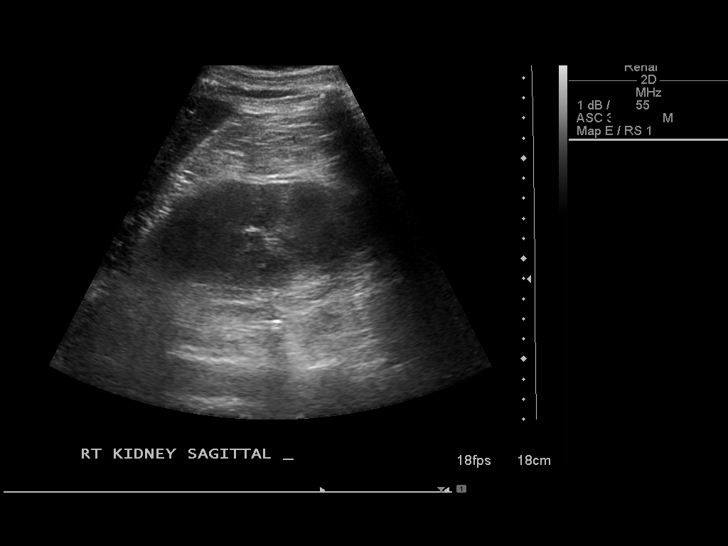
[im 16/48]
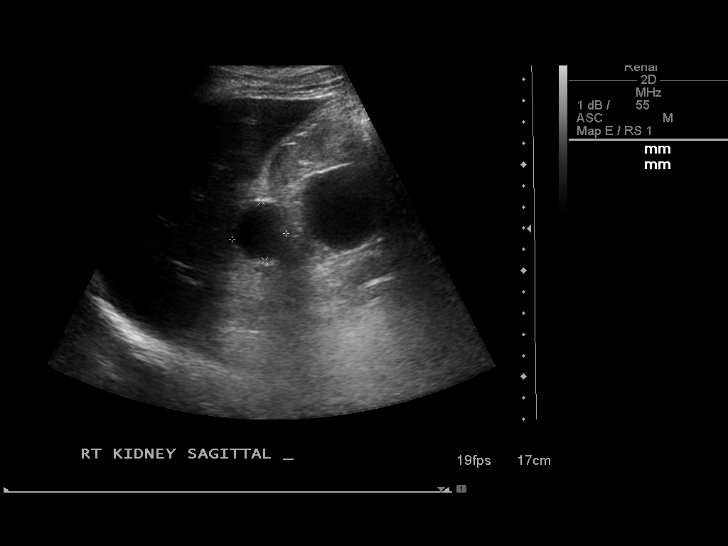
[im 20/48]
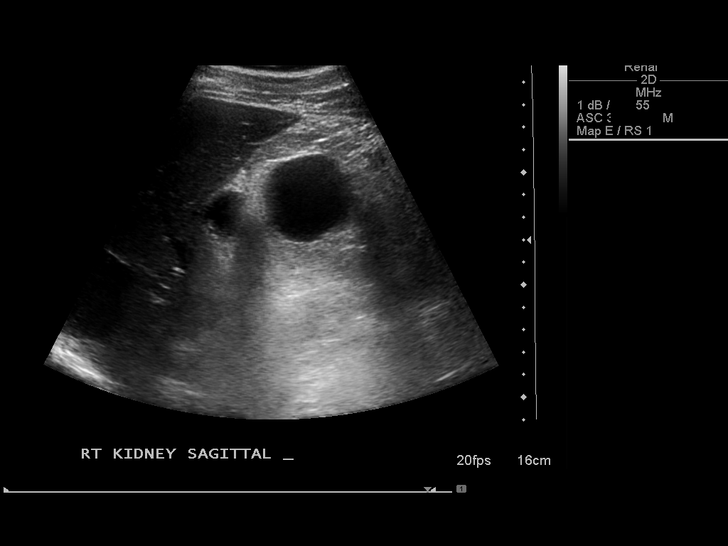
[im 24/48]
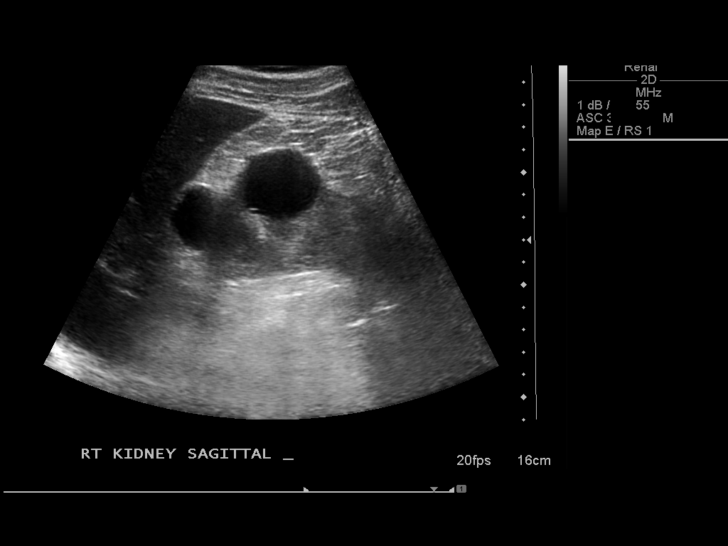
[im 28/48]
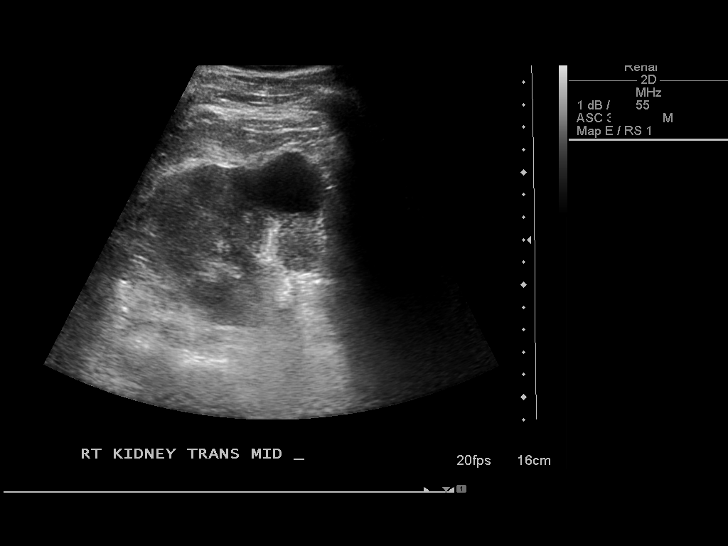
[im 32/48]
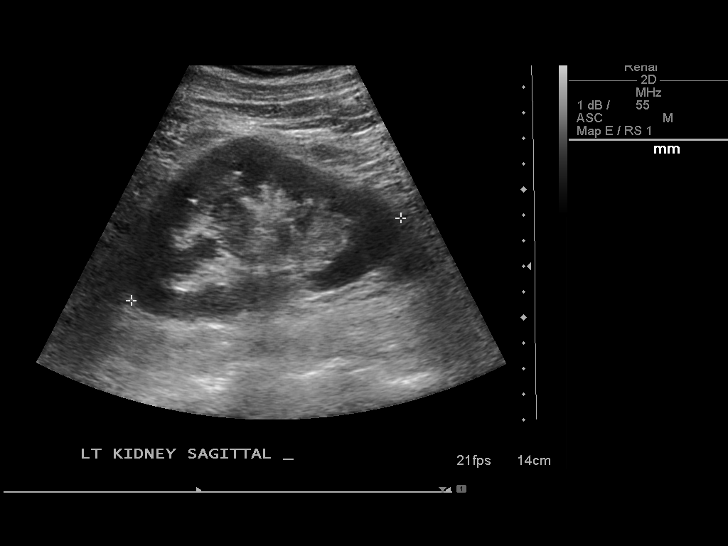
[im 36/48]
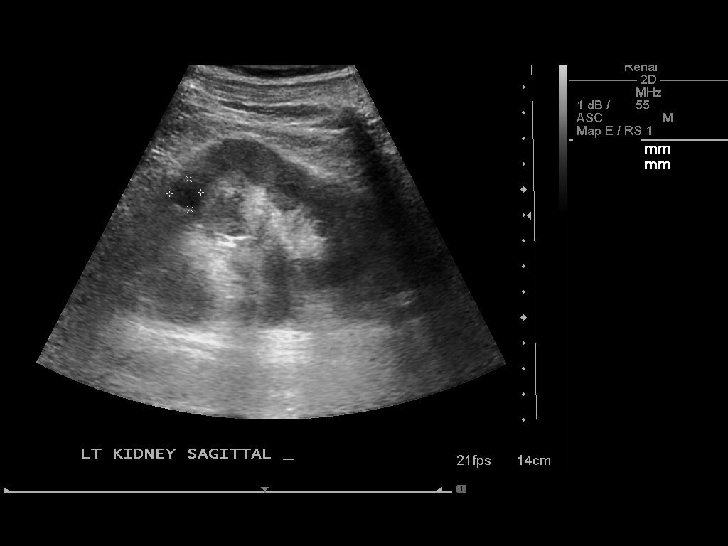
[im 40/48]
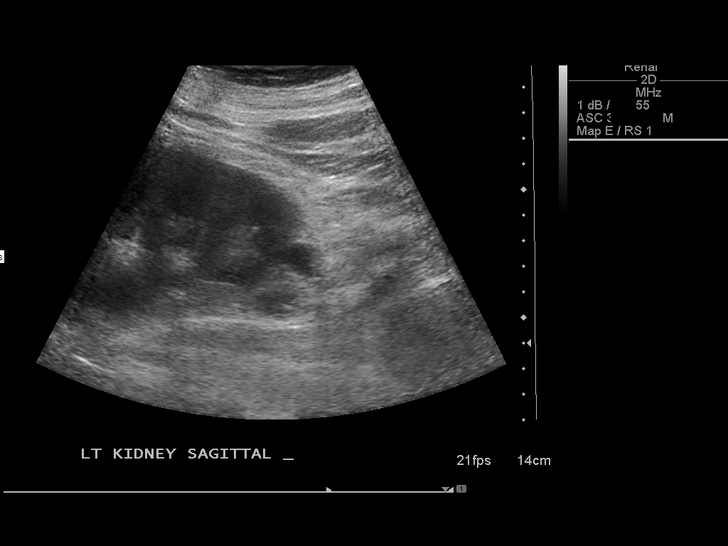
[im 44/48]
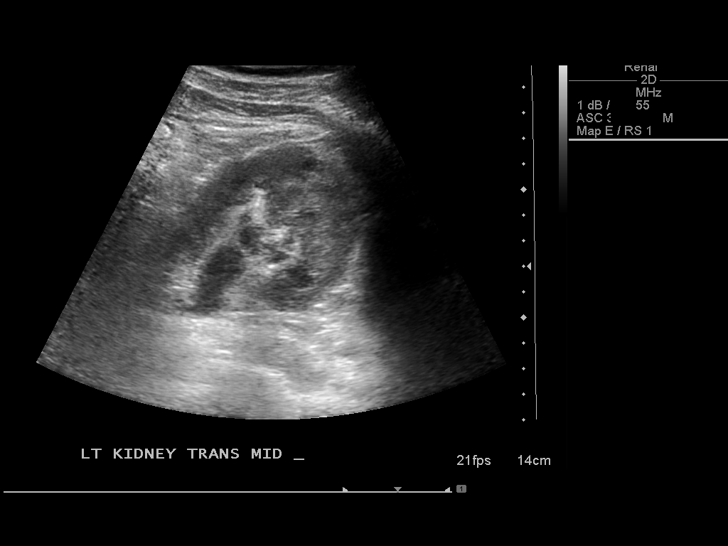
[im 48/48]
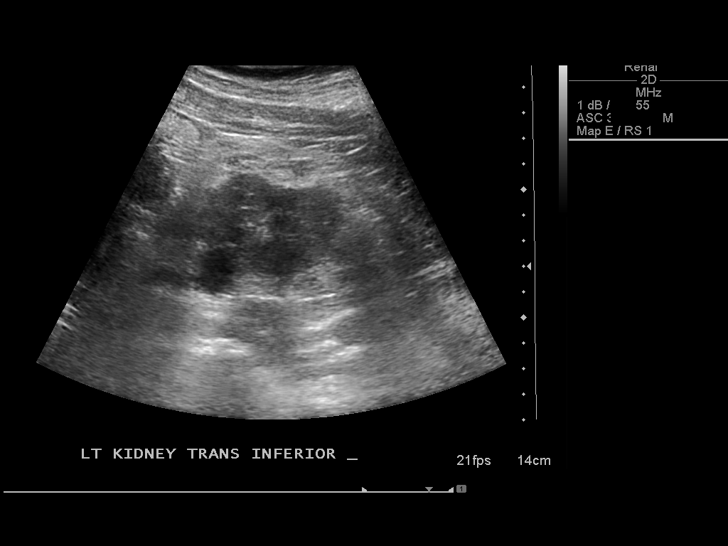

[13 of 25 positions shown; findings below may reference images not displayed]

FINDINGS: Right Kidney:  Measures 13.0 cm in sagittal length.  Echogenicity
of the renal parenchyma appears increased compared to the adjacent
liver.  There is mild prominence of the right renal collecting
system and calyces consistent with mild hydronephrosis.  Several
simple renal cysts are again seen.  The largest cyst is exophytic
from the mid pole measuring 4.0 x 4.0 x 4.0 cm.  The second largest
cyst is exophytic from the upper pole measuring 2.6 by 2.8 x
cm.

Left Kidney:  The left kidney measures 11.0 cm in sagittal length.
Renal cortex appears slightly echogenic.  There is mild fullness of
the left renal collecting system, suggesting mild hydronephrosis.
Three small cysts are seen, largest measuring 1.7 x 1.6 x 1.4 cm,
extending exophytically from the lower pole.

Bladder:  The urinary bladder is very distended at the time of the
imaging.  The sonographer reports that the patient had voided just
prior to the examination.  The urinary bladder wall thickness
appears normal.  Bladder volume is calculated to be 4817 cm cubed.

A small amount of ascites was noted adjacent to the liver.
IMPRESSION: 1.  Mild  bilateral hydronephrosis and very distended urinary
bladder. The fullness of both renal collecting systems could be
secondary to the patient's very distended urinary bladder.  The
urinary bladder is distended, despite the patient having reportedly
voided prior to performance of the ultrasound. Question if the
patient could have bladder outlet obstruction.
2.  Slight increased echogenicity of the renal cortices could be
due to chronic medical renal disease.
3.  Very small amount of ascites noted adjacent to the liver.
4.  Bilateral simple renal cysts.

## 2015-05-04 ENCOUNTER — Telehealth: Payer: Self-pay | Admitting: *Deleted

## 2015-05-16 NOTE — Telephone Encounter (Signed)
Opened in error
